# Patient Record
Sex: Male | Born: 1960 | Race: White | Hispanic: No | Marital: Married | Smoking: Former smoker
Health system: Southern US, Community
[De-identification: ages and names within clinical notes are randomized; demographics above are authoritative.]

## PROBLEM LIST (undated history)

## (undated) DIAGNOSIS — R945 Abnormal results of liver function studies: Secondary | ICD-10-CM

## (undated) DIAGNOSIS — R7989 Other specified abnormal findings of blood chemistry: Secondary | ICD-10-CM

## (undated) DIAGNOSIS — I428 Other cardiomyopathies: Secondary | ICD-10-CM

## (undated) DIAGNOSIS — I4729 Other ventricular tachycardia: Secondary | ICD-10-CM

## (undated) DIAGNOSIS — I472 Ventricular tachycardia: Secondary | ICD-10-CM

## (undated) DIAGNOSIS — I5022 Chronic systolic (congestive) heart failure: Secondary | ICD-10-CM

## (undated) DIAGNOSIS — I498 Other specified cardiac arrhythmias: Secondary | ICD-10-CM

## (undated) HISTORY — DX: Other ventricular tachycardia: I47.29

## (undated) HISTORY — DX: Ventricular tachycardia: I47.2

## (undated) HISTORY — DX: Abnormal results of liver function studies: R94.5

## (undated) HISTORY — DX: Other specified cardiac arrhythmias: I49.8

## (undated) HISTORY — DX: Chronic systolic (congestive) heart failure: I50.22

## (undated) HISTORY — DX: Other cardiomyopathies: I42.8

## (undated) HISTORY — PX: CARDIAC CATHETERIZATION: SHX172

## (undated) HISTORY — DX: Other specified abnormal findings of blood chemistry: R79.89

---

## 2010-12-09 ENCOUNTER — Inpatient Hospital Stay (HOSPITAL_COMMUNITY)
Admission: EM | Admit: 2010-12-09 | Discharge: 2010-12-15 | DRG: 287 | Disposition: A | Discharge status: Home / Self Care | Payer: Managed Care, Other (non HMO) | Attending: Cardiology | Admitting: Cardiology

## 2010-12-09 ENCOUNTER — Emergency Department (HOSPITAL_COMMUNITY): Payer: Managed Care, Other (non HMO)

## 2010-12-09 DIAGNOSIS — I5043 Acute on chronic combined systolic (congestive) and diastolic (congestive) heart failure: Principal | ICD-10-CM | POA: Diagnosis present

## 2010-12-09 DIAGNOSIS — I4729 Other ventricular tachycardia: Secondary | ICD-10-CM | POA: Diagnosis present

## 2010-12-09 DIAGNOSIS — J4489 Other specified chronic obstructive pulmonary disease: Secondary | ICD-10-CM | POA: Diagnosis present

## 2010-12-09 DIAGNOSIS — I428 Other cardiomyopathies: Secondary | ICD-10-CM | POA: Diagnosis present

## 2010-12-09 DIAGNOSIS — R079 Chest pain, unspecified: Secondary | ICD-10-CM

## 2010-12-09 DIAGNOSIS — J449 Chronic obstructive pulmonary disease, unspecified: Secondary | ICD-10-CM | POA: Diagnosis present

## 2010-12-09 DIAGNOSIS — I472 Ventricular tachycardia, unspecified: Secondary | ICD-10-CM | POA: Diagnosis present

## 2010-12-09 DIAGNOSIS — I429 Cardiomyopathy, unspecified: Secondary | ICD-10-CM | POA: Insufficient documentation

## 2010-12-09 DIAGNOSIS — E871 Hypo-osmolality and hyponatremia: Secondary | ICD-10-CM | POA: Diagnosis not present

## 2010-12-09 LAB — MAGNESIUM: Magnesium: 2 mg/dL (ref 1.5–2.5)

## 2010-12-09 LAB — COMPREHENSIVE METABOLIC PANEL
AST: 80 U/L — ABNORMAL HIGH (ref 0–37)
Albumin: 3.7 g/dL (ref 3.5–5.2)
Alkaline Phosphatase: 82 U/L (ref 39–117)
BUN: 22 mg/dL (ref 6–23)
Chloride: 102 mEq/L (ref 96–112)
GFR calc Af Amer: >60 mL/min (ref >60)
Potassium: 4.5 mEq/L (ref 3.5–5.1)
Total Bilirubin: 0.6 mg/dL (ref 0.3–1.2)
Total Protein: 6.3 g/dL (ref 6.0–8.3)

## 2010-12-09 LAB — CBC
HCT: 47.3 % (ref 39.0–52.0)
Hemoglobin: 16.5 g/dL (ref 13.0–17.0)
MCH: 34.6 pg — ABNORMAL HIGH (ref 26.0–34.0)
MCHC: 34.9 g/dL (ref 30.0–36.0)
MCV: 99.2 fL (ref 78.0–100.0)
RDW: 13.4 % (ref 11.5–15.5)

## 2010-12-09 LAB — DIFFERENTIAL
Eosinophils Relative: 1 % (ref 0–5)
Lymphocytes Relative: 23 % (ref 12–46)
Lymphs Abs: 3.1 10*3/uL (ref 0.7–4.0)
Monocytes Absolute: 1.1 10*3/uL — ABNORMAL HIGH (ref 0.1–1.0)
Monocytes Relative: 8 % (ref 3–12)
Neutro Abs: 8.8 10*3/uL — ABNORMAL HIGH (ref 1.7–7.7)

## 2010-12-09 LAB — PRO B NATRIURETIC PEPTIDE: Pro B Natriuretic peptide (BNP): 12351 pg/mL — ABNORMAL HIGH (ref 0–125)

## 2010-12-09 LAB — URINALYSIS, ROUTINE W REFLEX MICROSCOPIC
Bilirubin Urine: NEGATIVE
Ketones, ur: NEGATIVE mg/dL
Nitrite: NEGATIVE
Specific Gravity, Urine: 1.02 (ref 1.005–1.030)
Urobilinogen, UA: 0.2 mg/dL (ref 0.0–1.0)
pH: 5.5 (ref 5.0–8.0)

## 2010-12-09 LAB — POCT CARDIAC MARKERS
CKMB, poc: 5.2 ng/mL (ref 1.0–8.0)
CKMB, poc: 2.7 ng/mL (ref 1.0–8.0)
Myoglobin, poc: 82.5 ng/mL (ref 12–200)
Troponin i, poc: <0.05 ng/mL (ref 0.00–0.09)

## 2010-12-09 MED ORDER — IOHEXOL 300 MG/ML  SOLN
93.0000 mL | Freq: Once | INTRAMUSCULAR | Status: AC | PRN
  Administered 2010-12-09: 93 mL via INTRAVENOUS

## 2010-12-10 DIAGNOSIS — I5023 Acute on chronic systolic (congestive) heart failure: Secondary | ICD-10-CM

## 2010-12-10 LAB — CBC
Hemoglobin: 14.8 g/dL (ref 13.0–17.0)
MCH: 33.9 pg (ref 26.0–34.0)
Platelets: 291 10*3/uL (ref 150–400)
RBC: 4.36 MIL/uL (ref 4.22–5.81)
WBC: 10.6 10*3/uL — ABNORMAL HIGH (ref 4.0–10.5)

## 2010-12-10 LAB — CARDIAC PANEL(CRET KIN+CKTOT+MB+TROPI)
CK, MB: 6 ng/mL — ABNORMAL HIGH (ref 0.3–4.0)
CK, MB: 7.8 ng/mL (ref 0.3–4.0)
Relative Index: 3.5 — ABNORMAL HIGH (ref 0.0–2.5)
Total CK: 152 U/L (ref 7–232)
Total CK: 179 U/L (ref 7–232)

## 2010-12-10 LAB — COMPREHENSIVE METABOLIC PANEL
AST: 190 U/L — ABNORMAL HIGH (ref 0–37)
Albumin: 3.1 g/dL — ABNORMAL LOW (ref 3.5–5.2)
Alkaline Phosphatase: 68 U/L (ref 39–117)
BUN: 21 mg/dL (ref 6–23)
GFR calc Af Amer: >60 mL/min (ref >60)
Potassium: 3.6 mEq/L (ref 3.5–5.1)
Sodium: 140 mEq/L (ref 135–145)
Total Protein: 5.4 g/dL — ABNORMAL LOW (ref 6.0–8.3)

## 2010-12-10 LAB — LIPID PANEL
Cholesterol: 118 mg/dL (ref 0–200)
Total CHOL/HDL Ratio: 2.4 RATIO
VLDL: 8 mg/dL (ref 0–40)

## 2010-12-10 LAB — DIFFERENTIAL
Basophils Absolute: 0.1 10*3/uL (ref 0.0–0.1)
Basophils Relative: 1 % (ref 0–1)
Eosinophils Absolute: 0.1 10*3/uL (ref 0.0–0.7)
Neutro Abs: 6.3 10*3/uL (ref 1.7–7.7)
Neutrophils Relative %: 60 % (ref 43–77)

## 2010-12-10 LAB — PRO B NATRIURETIC PEPTIDE: Pro B Natriuretic peptide (BNP): 12676 pg/mL — ABNORMAL HIGH (ref 0–125)

## 2010-12-10 LAB — HEMOGLOBIN A1C: Mean Plasma Glucose: 123 mg/dL — ABNORMAL HIGH (ref <117)

## 2010-12-10 LAB — HEPARIN LEVEL (UNFRACTIONATED)
Heparin Unfractionated: <0.1 IU/mL — ABNORMAL LOW (ref 0.30–0.70)
Heparin Unfractionated: 0.32 IU/mL (ref 0.30–0.70)

## 2010-12-11 ENCOUNTER — Inpatient Hospital Stay (HOSPITAL_COMMUNITY): Payer: Managed Care, Other (non HMO)

## 2010-12-11 DIAGNOSIS — I369 Nonrheumatic tricuspid valve disorder, unspecified: Secondary | ICD-10-CM

## 2010-12-11 LAB — DIFFERENTIAL
Basophils Relative: 1 % (ref 0–1)
Eosinophils Absolute: 0.2 10*3/uL (ref 0.0–0.7)
Eosinophils Relative: 2 % (ref 0–5)
Lymphs Abs: 3.3 10*3/uL (ref 0.7–4.0)
Monocytes Relative: 9 % (ref 3–12)

## 2010-12-11 LAB — BASIC METABOLIC PANEL
Chloride: 100 mEq/L (ref 96–112)
Creatinine, Ser: 1.02 mg/dL (ref 0.4–1.5)
GFR calc Af Amer: >60 mL/min (ref >60)
Potassium: 3.7 mEq/L (ref 3.5–5.1)
Sodium: 138 mEq/L (ref 135–145)

## 2010-12-11 LAB — CARBOXYHEMOGLOBIN
Carboxyhemoglobin: 1.1 % (ref 0.5–1.5)
Methemoglobin: 0.3 % (ref 0.0–1.5)
O2 Saturation: 67.2 %
Total hemoglobin: 15.6 g/dL (ref 13.5–18.0)

## 2010-12-11 LAB — COMPREHENSIVE METABOLIC PANEL
Albumin: 3 g/dL — ABNORMAL LOW (ref 3.5–5.2)
BUN: 25 mg/dL — ABNORMAL HIGH (ref 6–23)
Calcium: 8.8 mg/dL (ref 8.4–10.5)
Creatinine, Ser: 1.02 mg/dL (ref 0.4–1.5)
Glucose, Bld: 96 mg/dL (ref 70–99)
Total Protein: 5.3 g/dL — ABNORMAL LOW (ref 6.0–8.3)

## 2010-12-11 LAB — CBC
MCH: 33.8 pg (ref 26.0–34.0)
MCV: 97.3 fL (ref 78.0–100.0)
Platelets: 294 10*3/uL (ref 150–400)
RDW: 13.2 % (ref 11.5–15.5)

## 2010-12-11 LAB — HEPARIN LEVEL (UNFRACTIONATED): Heparin Unfractionated: 0.29 IU/mL — ABNORMAL LOW (ref 0.30–0.70)

## 2010-12-11 LAB — MAGNESIUM: Magnesium: 1.9 mg/dL (ref 1.5–2.5)

## 2010-12-12 DIAGNOSIS — I428 Other cardiomyopathies: Secondary | ICD-10-CM

## 2010-12-12 LAB — BASIC METABOLIC PANEL
BUN: 19 mg/dL (ref 6–23)
CO2: 31 mEq/L (ref 19–32)
Chloride: 97 mEq/L (ref 96–112)
Creatinine, Ser: 0.96 mg/dL (ref 0.4–1.5)

## 2010-12-12 LAB — CARBOXYHEMOGLOBIN: Total hemoglobin: 15.2 g/dL (ref 13.5–18.0)

## 2010-12-12 LAB — DIFFERENTIAL
Eosinophils Absolute: 0.3 10*3/uL (ref 0.0–0.7)
Lymphs Abs: 3.1 10*3/uL (ref 0.7–4.0)
Monocytes Absolute: 0.8 10*3/uL (ref 0.1–1.0)
Monocytes Relative: 8 % (ref 3–12)
Neutrophils Relative %: 56 % (ref 43–77)

## 2010-12-12 LAB — CBC
Hemoglobin: 14.9 g/dL (ref 13.0–17.0)
MCH: 33.7 pg (ref 26.0–34.0)
MCV: 98.9 fL (ref 78.0–100.0)
Platelets: 285 10*3/uL (ref 150–400)
RBC: 4.42 MIL/uL (ref 4.22–5.81)

## 2010-12-12 LAB — POCT I-STAT 3, VENOUS BLOOD GAS (G3P V)
O2 Saturation: 68 %
TCO2: 29 mmol/L (ref 0–100)

## 2010-12-12 LAB — POCT I-STAT 3, ART BLOOD GAS (G3+)
Acid-Base Excess: 1 mmol/L (ref 0.0–2.0)
Bicarbonate: 25.6 mEq/L — ABNORMAL HIGH (ref 20.0–24.0)
O2 Saturation: 94 %
pO2, Arterial: 69 mmHg — ABNORMAL LOW (ref 80.0–100.0)

## 2010-12-13 ENCOUNTER — Encounter: Payer: Self-pay | Admitting: *Deleted

## 2010-12-13 ENCOUNTER — Telehealth: Payer: Self-pay | Admitting: *Deleted

## 2010-12-13 LAB — DIFFERENTIAL
Basophils Absolute: 0 10*3/uL (ref 0.0–0.1)
Eosinophils Absolute: 0.2 10*3/uL (ref 0.0–0.7)
Eosinophils Relative: 3 % (ref 0–5)

## 2010-12-13 LAB — CBC
MCV: 98.9 fL (ref 78.0–100.0)
Platelets: 275 10*3/uL (ref 150–400)
RDW: 13.4 % (ref 11.5–15.5)
WBC: 8.4 10*3/uL (ref 4.0–10.5)

## 2010-12-13 LAB — BASIC METABOLIC PANEL
BUN: 22 mg/dL (ref 6–23)
Calcium: 9.4 mg/dL (ref 8.4–10.5)
GFR calc non Af Amer: >60 mL/min (ref >60)
Potassium: 4.3 mEq/L (ref 3.5–5.1)
Sodium: 136 mEq/L (ref 135–145)

## 2010-12-13 NOTE — Telephone Encounter (Signed)
INPATIENT ADMISSION 12/09/10 BY [facility] DUE TO ABNORMAL EKG, SOB AND CARDIOMYOPATHY.  INPATIENT ADMISSION WAS AUTHORIZED, BUT CATH ON 12/12/10 WAS NOT.  CATH WAS NOT ON CATH LIST OR ON ADD ON LIST.  DOCUMENTATION IS AVAILABLE FOR THIS LIST.  CANNOT RETRO AUTH FOR CATH.

## 2010-12-14 LAB — BASIC METABOLIC PANEL
Calcium: 9.2 mg/dL (ref 8.4–10.5)
GFR calc Af Amer: >60 mL/min (ref >60)
GFR calc non Af Amer: >60 mL/min (ref >60)
Potassium: 4.2 mEq/L (ref 3.5–5.1)
Sodium: 136 mEq/L (ref 135–145)

## 2010-12-14 LAB — CARBOXYHEMOGLOBIN
Methemoglobin: 0.8 % (ref 0.0–1.5)
Total hemoglobin: 15.5 g/dL (ref 13.5–18.0)

## 2010-12-14 LAB — DIFFERENTIAL
Eosinophils Absolute: 0.3 10*3/uL (ref 0.0–0.7)
Eosinophils Relative: 4 % (ref 0–5)
Lymphs Abs: 2.6 10*3/uL (ref 0.7–4.0)
Monocytes Absolute: 0.7 10*3/uL (ref 0.1–1.0)

## 2010-12-14 LAB — CBC
HCT: 45 % (ref 39.0–52.0)
MCHC: 34 g/dL (ref 30.0–36.0)
Platelets: 266 10*3/uL (ref 150–400)
RDW: 13.4 % (ref 11.5–15.5)
WBC: 7.7 10*3/uL (ref 4.0–10.5)

## 2010-12-14 LAB — HIV ANTIBODY (ROUTINE TESTING W REFLEX): HIV: NONREACTIVE

## 2010-12-15 DIAGNOSIS — I5021 Acute systolic (congestive) heart failure: Secondary | ICD-10-CM

## 2010-12-15 LAB — BASIC METABOLIC PANEL
BUN: 20 mg/dL (ref 6–23)
Chloride: 99 mEq/L (ref 96–112)
Creatinine, Ser: 1.03 mg/dL (ref 0.4–1.5)
GFR calc non Af Amer: >60 mL/min (ref >60)
Glucose, Bld: 98 mg/dL (ref 70–99)
Potassium: 4.6 mEq/L (ref 3.5–5.1)

## 2010-12-15 LAB — DIFFERENTIAL
Eosinophils Absolute: 0.3 10*3/uL (ref 0.0–0.7)
Eosinophils Relative: 4 % (ref 0–5)
Lymphocytes Relative: 29 % (ref 12–46)
Lymphs Abs: 2.5 10*3/uL (ref 0.7–4.0)
Monocytes Relative: 8 % (ref 3–12)

## 2010-12-15 LAB — CARBOXYHEMOGLOBIN
Methemoglobin: 0.8 % (ref 0.0–1.5)
Total hemoglobin: 15.5 g/dL (ref 13.5–18.0)

## 2010-12-15 LAB — CBC
HCT: 45.2 % (ref 39.0–52.0)
MCH: 33.4 pg (ref 26.0–34.0)
MCV: 98.7 fL (ref 78.0–100.0)
Platelets: 254 10*3/uL (ref 150–400)
RDW: 13.2 % (ref 11.5–15.5)
WBC: 8.5 10*3/uL (ref 4.0–10.5)

## 2010-12-15 LAB — TSH: TSH: 6.507 u[IU]/mL — ABNORMAL HIGH (ref 0.350–4.500)

## 2010-12-15 LAB — FERRITIN: Ferritin: 65 ng/mL (ref 22–322)

## 2010-12-15 NOTE — Cardiovascular Report (Signed)
  George Barber, George Barber                ACCOUNT NO.:  [id number]  MEDICAL RECORD NO.:  [id number]           PATIENT TYPE:  I  LOCATION:  2918                         FACILITY:  [facility]  PHYSICIAN:  Peter M. Swaziland, M.D.  DATE OF BIRTH:  1961-02-28  DATE OF PROCEDURE:  12/12/2010 DATE OF DISCHARGE:                           CARDIAC CATHETERIZATION   INDICATIONS FOR PROCEDURE:   50 year old white male who presents with new onset of severe cardiomyopathy.  PROCEDURE:  Right and left heart catheterization, coronary and left ventricular angiography.  ACCESS:  Via the right femoral artery and vein using standard Seldinger technique.  EQUIPMENT:  5-French 4-cm right and left Judkins catheter, 5-French pigtail catheter, 5-French arterial sheath, 8-French venous sheath and a VIP Swan-Ganz catheter.  MEDICATIONS:  Local anesthesia 1% Xylocaine, Versed 2 mg IV, fentanyl 25 mcg IV.  CONTRAST:  70 mL of Omnipaque.  HEMODYNAMIC DATA:  Right atrial pressure is 9/6 with a mean of 6 mmHg. Right ventricular pressure is 35 with an EDP of 4 mmHg.  Pulmonary artery pressures 31/19 with a mean of 25 mmHg.  Pulmonary capillary wedge pressure is 19/19 with a mean of 15 mmHg.  Aortic pressure is 88/69 with a mean of 78 mmHg.  The left ventricular pressure is 89 with an EDP of 14 mmHg.  Cardiac output by Fick is 3.51 L/min with an index of 1.93 L/min/m2.  ANGIOGRAPHIC DATA:  The left ventricle is severely enlarged with severe global hypokinesis and overall ejection fraction of 10-15%.  The left main coronary artery is normal.  The left anterior descending artery is normal.  The left circumflex coronary is a large dominant vessel and is normal.  The right coronary artery is a small nondominant vessel and is normal.  FINAL INTERPRETATION: 1. Normal coronary anatomy. 2. Severe left ventricular dysfunction. 3. Normal right heart pressures.          ______________________________ Peter M.  Swaziland, M.D.     PMJ/MEDQ  D:  12/12/2010  T:  12/13/2010  Job:  [id number]  cc:   Rollene Rotunda, MD, [facility]  Electronically Signed by PETER Swaziland M.D. on 12/15/2010 11:52:31 AM

## 2010-12-16 LAB — HEPATITIS PANEL, ACUTE
HCV Ab: NEGATIVE
Hep A IgM: NEGATIVE

## 2010-12-21 NOTE — Discharge Summary (Signed)
NAMEAVERY, KLINGBEIL                ACCOUNT NO.:  [id number]  MEDICAL RECORD NO.:  [id number]           PATIENT TYPE:  I  LOCATION:  2918                         FACILITY:  [facility]  PHYSICIAN:  Bevelyn Buckles. Bensimhon, MDDATE OF BIRTH:  August 27, 1960  DATE OF ADMISSION:  12/09/2010 DATE OF DISCHARGE:  12/15/2010                              DISCHARGE SUMMARY   PROCEDURES: 1. Right heart catheterization. 2. Left heart catheterization. 3. Coronary arteriogram. 4. Left ventriculogram. 5. A 2-D echocardiogram. 6. CT angiogram of the chest. 7. Portable chest x-ray.  PRIMARY FINAL DISCHARGE DIAGNOSIS:  Acute systolic congestive heart failure.  SECONDARY DIAGNOSES: 1. Nonischemic cardiomyopathy, new diagnosis. 2. History of ethyl alcohol and tobacco use.  TIME AT DISCHARGE:  52 minutes.  HOSPITAL COURSE:  [name] is a 50 year old male with no previous cardiac issues.  He had been having problems with cough, shortness of breath, and described dyspnea on exertion as well as orthopnea.  He came to the hospital and his chest x-ray suggested cardiomegaly.  He was sent to [facility] for further evaluation.  He had an echocardiogram, which showed an EF of 15%.  He had acute systolic congestive heart failure and was diuresed.  He was having runs of nonsustained VT.  Once his condition stabilized, he was taken to the Cath Lab for right and left heart catheterization.  This was done on Dec 12, 2010.  He had no coronary artery disease.  He had severe left ventricular dysfunction with an EF of 10-15%.  By the time, he was stable for cath, he had normal right heart pressures.  He tolerated the procedure well.  Despite diuresis and improving respiratory status, he continued to have episodes of nonsustained VT.  His magnesium level was checked and was within normal limits.  He had had some initial elevation in his CK-MBs, but his troponins remained negative.  His initial pro-BNP was 12,676. He was  started on amiodarone for the nonsustained VT and the LifeVest was sent for.  A smoking cessation consult was called and the importance of EtOH cessation was reinforced.  He was started on an ACE inhibitor and a beta-blocker.  His blood pressure was on the low side of normal, but he tolerated the medications well.  His Lasix was changed to p.o. as his respiratory status improved. Aldactone was added to his medication regimen as well.  His potassium required supplementation and this was done.  His TSH is minimally elevated at 6.507 with this can be rechecked as an outpatient.  His ferritin is within normal limits at 65.  He developed mild hyponatremia with diuresis and his sodium at discharge is 134, but this can be followed as an outpatient.  On Dec 15, 2010, he was evaluated by Dr. Gala Romney.  The patient was having tachypalpitations at times and still having some episodes of nonsustained VT, but he was otherwise asymptomatic with these.  The LifeVest was in place and the patient was instructed on how to will apply it.  By Dec 15, 2010, his co-ox was 64.5%.  His O2 saturation was 97-100% on room air.  The patient's  weight had gone from 155 pounds on admission to 131.9 pounds.  Mr. Salois was evaluated by Dr. Gala Romney and considered stable for discharge, to follow up as an outpatient.  DISCHARGE INSTRUCTIONS: 1. His activity level is to be increased gradually. 2. He is to weigh himself daily. 3. He is to stick to a 2000 mg sodium heart-healthy diet. 4. He is to call our office for problems with the cath site. 5. He is not to work until cleared by MD. 6. He is to follow up with Tereso Newcomer, PA-C for Dr. Gala Romney on     December 23, 2010 at 10:30.  He is to get a BMET that day.  DISCHARGE MEDICATIONS: 1. Medrol Dosepak is discontinued. 2. Symbicort t.i.d. p.r.n. 3. Enalapril 10 mg b.i.d. 4. Amiodarone 200 mg two tablets b.i.d. 5. Spiriva b.i.d. p.r.n. 6. Coreg 6.25 mg b.i.d. 7. Lasix  40 mg a day. 8. Aldactone 25 mg a day. 9. Multivitamin daily. 10.Biaxin is discontinued. 11.Aspirin 81 mg a day. 12.Hydrochlorothiazide is discontinued.     Theodore Demark, PA-C   ______________________________ Bevelyn Buckles. Bensimhon, MD    RB/MEDQ  D:  12/15/2010  T:  12/15/2010  Job:  [id number]  Electronically Signed by Theodore Demark PA-C on 12/21/2010 11:27:59 AM Electronically Signed by Arvilla Meres MD on 12/21/2010 10:43:32 PM

## 2010-12-22 ENCOUNTER — Encounter: Payer: Self-pay | Admitting: Physician Assistant

## 2010-12-23 ENCOUNTER — Other Ambulatory Visit: Payer: Managed Care, Other (non HMO) | Admitting: *Deleted

## 2010-12-23 ENCOUNTER — Telehealth: Payer: Self-pay | Admitting: *Deleted

## 2010-12-23 ENCOUNTER — Encounter: Payer: Self-pay | Admitting: Physician Assistant

## 2010-12-23 ENCOUNTER — Ambulatory Visit (INDEPENDENT_AMBULATORY_CARE_PROVIDER_SITE_OTHER): Payer: Managed Care, Other (non HMO) | Admitting: Physician Assistant

## 2010-12-23 VITALS — BP 108/78 | HR 72 | Ht 69.0 in | Wt 137.0 lb

## 2010-12-23 DIAGNOSIS — I5022 Chronic systolic (congestive) heart failure: Secondary | ICD-10-CM | POA: Insufficient documentation

## 2010-12-23 DIAGNOSIS — E785 Hyperlipidemia, unspecified: Secondary | ICD-10-CM

## 2010-12-23 DIAGNOSIS — I472 Ventricular tachycardia: Secondary | ICD-10-CM

## 2010-12-23 DIAGNOSIS — R7989 Other specified abnormal findings of blood chemistry: Secondary | ICD-10-CM

## 2010-12-23 DIAGNOSIS — I4729 Other ventricular tachycardia: Secondary | ICD-10-CM | POA: Insufficient documentation

## 2010-12-23 DIAGNOSIS — I429 Cardiomyopathy, unspecified: Secondary | ICD-10-CM

## 2010-12-23 LAB — HEPATIC FUNCTION PANEL
AST: 30 U/L (ref 0–37)
Alkaline Phosphatase: 70 U/L (ref 39–117)
Bilirubin, Direct: 0.1 mg/dL (ref 0.0–0.3)

## 2010-12-23 LAB — BASIC METABOLIC PANEL
BUN: 21 mg/dL (ref 6–23)
CO2: 28 mEq/L (ref 19–32)
Calcium: 9.3 mg/dL (ref 8.4–10.5)
Chloride: 95 mEq/L — ABNORMAL LOW (ref 96–112)
Creatinine, Ser: 1.1 mg/dL (ref 0.4–1.5)

## 2010-12-23 MED ORDER — ENALAPRIL MALEATE 10 MG PO TABS: 10.0000 mg | ORAL_TABLET | Freq: Two times a day (BID) | ORAL | Status: DC

## 2010-12-23 MED ORDER — AMIODARONE HCL 200 MG PO TABS: ORAL_TABLET | ORAL | Status: DC

## 2010-12-23 MED ORDER — CARVEDILOL 12.5 MG PO TABS: 12.5000 mg | ORAL_TABLET | Freq: Two times a day (BID) | ORAL | Status: DC

## 2010-12-23 NOTE — Assessment & Plan Note (Signed)
Will need follow up echo in about 3 months after on maximum medical therapy.

## 2010-12-23 NOTE — H&P (Addendum)
NAMEJHONY, George Barber                ACCOUNT NO.:  [id number]  MEDICAL RECORD NO.:  [id number]           PATIENT TYPE:  I  LOCATION:  2918                         FACILITY:  [facility]  PHYSICIAN:  Rollene Rotunda, MD, FACCDATE OF BIRTH:  Mar 22, 1961  DATE OF ADMISSION:  12/09/2010 DATE OF DISCHARGE:                             HISTORY & PHYSICAL   PRIMARY CARDIOLOGIST:  New.  PRIMARY MEDICAL DOCTOR:  Does not have one.  CHIEF COMPLAINT:  Shortness of breath.  HISTORY OF PRESENT ILLNESS:  Mr. Yeo is a very pleasant 50 year old gentleman with no significant past medical history, although he does not regularly see a doctor.  He has no prior cardiac history as well.  He has had several months of shortness of breath, coughing, and a smothering sensation while he has been sleeping.  His sister-in-law noticed yesterday that his shortness of breath became so bad that he could not even push a buggy in the store without having to stop.  He has had no chest pain, whatsoever, including with exertion.  He has had lower extremity edema for several months as well.  He sought medical care from a walk-in type setting, was treated with various antibiotics, inhalers, and hydrochlorothiazide without significant relief.  He was seen today at [facility] and noted to have shortness of breath, tachycardia, frequent ectopy on EKG with the chest x-ray suggesting cardiomegaly and was subsequently sent over to [facility] for further evaluation.  He has demonstrated nonsustained VT here in the ER on the monitor with approximately 10 beats.  He does endorse a history of fluttering sensations in his chest going back many years, lasting 10-15 seconds at a time on rare occasions, associated with a shortness of breath sensation.  He has not had any episodes of syncope.  He was started on a lidocaine drip here in the ER.  He is also written to receive 80 mg of IV Lasix.  Currently, he  denies any chest pain, and just mild shortness of breath.  He feels anxious.  His EKG demonstrates sinus tach with PVCs and couplets and occasional PVCs with Q-waves in V1 through V3.  He denies any acute event which he can say was different than any other progressively worse than he has had since onset of the shortness of breath.  PAST MEDICAL HISTORY:  None.  Does not see any doctor regularly.  He has had heart fluttering as above in the past, but no formal heart workup.  MEDICATIONS:  The patient is not sure of his medications.  He notes he has been treated with antibiotics, Medrol, and hydrochlorothiazide without relief.  ALLERGIES:  No known drug allergies.  SOCIAL HISTORY:  Mr. Stan is married and lives with his wife.  They have 1 child together.  He is an Personnel officer.  He smoked for 20 years and quit 1 month ago.  He quit alcohol 1 month ago, after a 20-year history of what he describes as social alcohol, but does endorse drinking 6-8 beers per day.  FAMILY HISTORY:  Mother died at age 17 of cancer.  Father is living  with no known CAD, but does have emphysema.  His siblings are in good health with heart problems.  REVIEW OF SYSTEMS:  No fevers, chills, chest pain, nausea, vomiting, diarrhea, bright red blood per rectum, melena, or hematemesis.  No hematuria.  All other systems are reviewed and otherwise negative. Please see HPI for pertinent positives.  LABORATORY DATA:  WBC 13.1, hemoglobin 16.5, hematocrit 47.3, platelet count 331.  Sodium 139, potassium 4.5, chloride 102, CO2 of 24, glucose 124, BUN 22, creatinine 1.27.  Total bilirubin 0.6, alk phos 82, AST 80, ALT 114, albumin 3.7, BNP 12,351.  Cardiac enzymes negative x1.  RADIOLOGY: 1. Chest x-ray showed no acute abnormality, indeterminate nodule of     the right upper lobe. 2. CT angio, formal report not yet populating in e-chart and unable to     access M-Stat.  Negative for PE.  Please refer to report when  it     becomes available.  PHYSICAL EXAMINATION:  VITAL SIGNS:  Temperature 98.7, pulse 123, respirations 18, blood pressure 125/97, pulse ox 100% on 2 L. GENERAL:  This is a pleasant white male in no acute distress, thin. HEENT:  Normocephalic, atraumatic with extraocular movements intact. Clear sclerae.  Nares are without discharge. NECK:  Supple without carotid bruit. HEART:  Auscultation of the heart reveals tachycardic rate, irregular rhythm with frequent ectopy correlating on the monitor without murmurs, rubs, or gallops. LUNGS:  Lung sounds have bilateral rales without wheezes or rhonchi about half the way up, good air movement. ABDOMEN:  Soft, nontender, nondistended with positive bowel sounds. EXTREMITIES:  Warm, dry, and with trace sock line edema, 2+ pedal pulses bilaterally. NEUROLOGIC:  He is alert and oriented x3, responds to questions appropriately with a normal affect.  He is somewhat anxious appearing.  ASSESSMENT AND PLAN:  The patient was seen and examined by Dr. Antoine Poche and myself.  This is a 50 year old gentleman without prior cardiac history or known medical history, who has not been followed by any primary care physician.  He presented with progressive dyspnea on exertion in the emergency room.  He is known to be tachycardic with frequent ectopy as well as nonsustained ventricular tachycardia. Bedside echocardiogram does demonstrate global hypokinesis with an ejection fraction of 15% in this presentation and is felt consistent with systolic congestive heart failure.  At this time, the etiology of his cardiomyopathy is unknown, but he does have a significant history of alcohol abuse, having quit 1 month ago.  However, given his ectopy and nonsustained VT, he will require a cardiac catheterization when able to lie flat to rule out ischemia as a contributing cause.  We will check a full formal echo.  An ACE inhibitor will be started in the form of enalapril 2.5  mg p.o. b.i.d.  We will hold off on beta-blocker given his acute state, but this may be started tomorrow if his pressure holds.  He may also be a candidate for spironolactone therapy with eventual consideration for ICD if his EF should not improve with medical therapy. Per Dr. Antoine Poche, we will discontinue his IV lidocaine, and if his VT should recur, we would consider IV amiodarone.  We will also check lab work to assess his other cardiac risk factors, including hemoglobin A1c with an abnormal glucose as well as a fasting lipid panel.  His liver function tests are somewhat elevated, question of this is related to possible alcohol use versus hepatic congestion versus an ischemic event.  Cardiac enzymes will be checked as  well.  His leukocytosis is felt secondary to his presentation in the setting of recent Medrol Dosepak use.  We will continue to follow these.  Plan was discussed with the patient's family who were in agreement.     Ronie Spies, P.A.C.   ______________________________ Rollene Rotunda, MD, [facility]    DD/MEDQ  D:  12/09/2010  T:  12/10/2010  Job:  [id number]  Electronically Signed by Ronie Spies  on 12/23/2010 01:42:13 PM Electronically Signed by Rollene Rotunda MD [facility] on 01/11/2011 11:44:01 AM

## 2010-12-23 NOTE — Progress Notes (Signed)
History of Present Illness: Primary Cardiologist:  Dr. Arvilla Meres  George Barber is a 50 y.o. male Who presented to [facility] 5/18 with evidence of acute systolic heart failure.  He was diuresed and taken to the cardiac catheterization lab where he was noted to have normal coronary arteries.  Echocardiogram demonstrated an EF of 15%.  Heart failure medications were initiated and titrated.  He was noted to have several bouts of nonsustained ventricular tachycardia.  He was placed on amiodarone and was also placed on life fast.  He returns today for followup.  Labs: Na 134, K 4.6, creat 1.03, Hgb 15.3, TC 118, TG 40, HDL 49, LDL 61, AST 87, ALT 164, ALP 66, TSH 2.883, BNP 12,676, A1C 5.9, CE's neg x 3, Chest CT: no PE Hepatitis serologies: negative; HIV non reactive.  He is doing well.  No chest pain.  He feels tired.  Denies significant DOE.  Probable NYHA class 2-2b.  No orthopnea, PND, edema.  No syncope.  No alerts or therapies from his LifeVest.  No pain over cath site.  Watching salt.  Using salt substitute.  No more alcohol.  No weight gain since he went home.  Taking all meds.  Notes some lip dryness.  Past Medical History  Diagnosis Date  . NICM (nonischemic cardiomyopathy)     cath 12/13/10: Normal cors, EF 10-15%;  b. echo 5/12 EF 15%, mild MR, mod LAE, mild RVE, mild to mod RAE, mild to mod TR, PASP 44  . Systolic CHF, chronic   . NSVT (nonsustained ventricular tachycardia)     Life Vest; amiodarone rx  . Emphysema     Current Outpatient Prescriptions  Medication Sig Dispense Refill  . amiodarone (PACERONE) 200 MG tablet Take 400 mg by mouth 2 (two) times daily.       [name] Kitchen aspirin 81 MG tablet Take 81 mg by mouth daily.        . carvedilol (COREG) 12.5 MG tablet Take 1 tablet (12.5 mg total) by mouth 2 (two) times daily with a meal.  60 tablet  11  . enalapril (VASOTEC) 10 MG tablet Take 1 tablet (10 mg total) by mouth 2 (two) times daily.  60 tablet  11  . furosemide  (LASIX) 20 MG tablet Take 20 mg by mouth daily.        [name] Kitchen spironolactone (ALDACTONE) 25 MG tablet Take 25 mg by mouth daily.        [name] Kitchen DISCONTD: carvedilol (COREG) 6.25 MG tablet Take 6.25 mg by mouth 2 (two) times daily with a meal.        . DISCONTD: enalapril (VASOTEC) 10 MG tablet Take 10 mg by mouth 3 (three) times daily.       [name] Kitchen DISCONTD: furosemide (LASIX) 40 MG tablet Take 40 mg by mouth daily.        [name] Kitchen DISCONTD: Multiple Vitamin (MULTIVITAMIN) capsule Take 1 capsule by mouth daily.        [name] Kitchen DISCONTD: tiotropium (SPIRIVA) 18 MCG inhalation capsule Place 18 mcg into inhaler and inhale daily.          Allergies: No Known Allergies  Vital Signs: BP 108/78  Pulse 72  Ht 5\' 9"  (1.753 m)  Wt 137 lb (62.143 kg)  BMI 20.23 kg/m2  PHYSICAL EXAM: Well nourished, well developed, in no acute distress HEENT: normal Neck: no JVD Cardiac:  normal S1, S2; RRR; no murmur, no gallops Lungs:  clear to auscultation bilaterally, no wheezing, rhonchi or  rales Abd: soft, nontender, no hepatomegaly Ext: no edema; RFA site without hematoma or bruit Skin: warm and dry Neuro:  CNs 2-12 intact, no focal abnormalities noted  EKG:  Sinus rhythm, heart rate 72, normal axis, poor R-wave progression with Q waves in leads V1-V2, nonspecific ST-T wave changes  ASSESSMENT AND PLAN:

## 2010-12-23 NOTE — Assessment & Plan Note (Addendum)
Doing well.  Volume is stable.  Not sure why he is on enalapril TID.  Reviewed his chart and discharge notes indicate he was to take it BID.  Change enalapril to 10 mg BID.  Increase coreg to 12.5 mg BID.  Otherwise, he is on a good medical regimen.  We discussed salt restriction and refraining from alcohol.  Check BMET today.  Follow up with me in 2 weeks.  He lives close to [place] and would like to follow up there.  Will set him up with Dr. Antoine Poche in 4 weeks.

## 2010-12-23 NOTE — Patient Instructions (Addendum)
Change Enalapril (Vasotec) to 10 mg take one tablet twice daily. Increase Carvedilol (Coreg) to 12.5 mg twice daily (you can take 2 of the 6.25 mg tabs to equal 12.5 mg). RX sent to [facility] in [place] In one week, decrease Amiodarone to 200 mg one tablet twice daily.    Your physician recommends that you return for lab work in: TODAY BMET 428.22,  LFT'S 272.4     Your physician recommends that you schedule a follow-up appointment in: DR. Antoine Poche 01/18/11 @ 2 PM  Your physician recommends that you schedule a follow-up appointment in: [facility], PA-C 01/06/11 9:30 AM

## 2010-12-23 NOTE — Assessment & Plan Note (Signed)
Decrease Amiodarone to 200 mg BID in one week.  Continue LifeVest.  If EF does not recover, he will need EP referral for AICD.  Check LFTs today to follow up on elevated LFTs.  This may have been from hepatic congestion.  Will need TSH again in about 6-8 weeks.

## 2010-12-23 NOTE — Telephone Encounter (Signed)
LMOM to call back for instructions and to schedule a lab appointment.

## 2010-12-24 ENCOUNTER — Telehealth: Payer: Self-pay | Admitting: *Deleted

## 2010-12-24 DIAGNOSIS — I5022 Chronic systolic (congestive) heart failure: Secondary | ICD-10-CM

## 2010-12-24 NOTE — Telephone Encounter (Signed)
See phone note

## 2010-12-28 ENCOUNTER — Telehealth: Payer: Self-pay | Admitting: Cardiology

## 2010-12-28 ENCOUNTER — Other Ambulatory Visit (INDEPENDENT_AMBULATORY_CARE_PROVIDER_SITE_OTHER): Payer: Managed Care, Other (non HMO) | Admitting: *Deleted

## 2010-12-28 DIAGNOSIS — I5022 Chronic systolic (congestive) heart failure: Secondary | ICD-10-CM

## 2010-12-28 LAB — BASIC METABOLIC PANEL
BUN: 24 mg/dL — ABNORMAL HIGH (ref 6–23)
Calcium: 9.1 mg/dL (ref 8.4–10.5)
Creatinine, Ser: 1.2 mg/dL (ref 0.4–1.5)
GFR: 70.29 mL/min (ref >60.00)
Glucose, Bld: 69 mg/dL — ABNORMAL LOW (ref 70–99)

## 2010-12-28 NOTE — Telephone Encounter (Signed)
Pt returned your call.  

## 2011-01-06 ENCOUNTER — Encounter: Payer: Self-pay | Admitting: Physician Assistant

## 2011-01-06 ENCOUNTER — Ambulatory Visit (INDEPENDENT_AMBULATORY_CARE_PROVIDER_SITE_OTHER): Payer: Managed Care, Other (non HMO) | Admitting: Physician Assistant

## 2011-01-06 VITALS — BP 102/64 | HR 57 | Ht 69.0 in | Wt 144.0 lb

## 2011-01-06 DIAGNOSIS — I429 Cardiomyopathy, unspecified: Secondary | ICD-10-CM

## 2011-01-06 DIAGNOSIS — I472 Ventricular tachycardia, unspecified: Secondary | ICD-10-CM

## 2011-01-06 DIAGNOSIS — I4729 Other ventricular tachycardia: Secondary | ICD-10-CM

## 2011-01-06 DIAGNOSIS — I5022 Chronic systolic (congestive) heart failure: Secondary | ICD-10-CM

## 2011-01-06 MED ORDER — SPIRONOLACTONE 25 MG PO TABS: ORAL_TABLET | ORAL | Status: DC

## 2011-01-06 NOTE — Patient Instructions (Addendum)
Please check your medications at home with the list and call us if there is anything that does not match or make sense.  Start back on Spironolactone (Aldactone).  Take 25 mg 1/2 tablet daily for 3 days, then increase to 1 whole tablet daily.    Your physician recommends that you return for lab work in: please have lab work drawn in 7-10 days (BMP)

## 2011-01-06 NOTE — Progress Notes (Signed)
History of Present Illness: Primary Cardiologist:  Dr. Arvilla Meres  George Barber is a 50 y.o. male with NICM and chronic systolic heart failure.  Cardiac catheterization 5/12 demonstrated normal coronary arteries.  Echocardiogram 5/12 demonstrated an EF of 15%.  Due to nonsustained ventricular tachycardia, he was placed on amiodarone and a Life Vest.  I saw him 2 weeks ago for initial post hospital follow up.  His ACE was filled at [facility].  I changed this to b.i.d. and increased his carvedilol.  His potassium was too high and I stopped his spironolactone.  Follow up basic metabolic panel was normal.  He returns today for follow up.  He is doing well.  He has an occasional skipped beat.  He denies chest pain.  He denies shortness of breath.  He is NYHA class II.  He denies a tiny, PND or edema.  He denies syncope.  He denies any therapies from his life vest.  He is compliant with his diet.  Past Medical History  Diagnosis Date  . NICM (nonischemic cardiomyopathy)     cath 12/13/10: Normal cors, EF 10-15%;  b. echo 5/12 EF 15%, mild MR, mod LAE, mild RVE, mild to mod RAE, mild to mod TR, PASP 44  . Systolic CHF, chronic   . NSVT (nonsustained ventricular tachycardia)     Life Vest; amiodarone rx  . Emphysema     Current Outpatient Prescriptions  Medication Sig Dispense Refill  . amiodarone (PACERONE) 200 MG tablet Take 200 mg by mouth 2 (two) times daily.        [name] Kitchen aspirin 81 MG tablet Take 81 mg by mouth daily.        . carvedilol (COREG) 12.5 MG tablet Take 1 tablet (12.5 mg total) by mouth 2 (two) times daily with a meal.  60 tablet  11  . enalapril (VASOTEC) 10 MG tablet Take 1 tablet (10 mg total) by mouth 2 (two) times daily.      . furosemide (LASIX) 20 MG tablet Take 20 mg by mouth daily.        [name] Kitchen spironolactone (ALDACTONE) 25 MG tablet Take 25 mg by mouth daily.        [name] Kitchen DISCONTD: amiodarone (PACERONE) 200 MG tablet TAKE 2 TABS TWICE DAILY X 1 WEEK; THEN DECREASE TO 200 MG 1 TAB  TWICE DAILY  90 tablet  3  . DISCONTD: enalapril (VASOTEC) 10 MG tablet Take 1 tablet (10 mg total) by mouth 2 (two) times daily.  60 tablet  11    Allergies: No Known Allergies  Vital Signs: BP 102/64  Pulse 57  Ht 5\' 9"  (1.753 m)  Wt 144 lb (65.318 kg)  BMI 21.27 kg/m2  PHYSICAL EXAM: Well nourished, well developed, in no acute distress HEENT: normal Neck: no JVD Cardiac:  normal S1, S2; RRR; no murmur, no gallops Lungs:  clear to auscultation bilaterally, no wheezing, rhonchi or rales Abd: soft, nontender, no hepatomegaly Ext: no edema Skin: warm and dry Neuro:  CNs 2-12 intact, no focal abnormalities noted  EKG:  Sinus rhythm, heart rate 57, normal axis, poor R-wave progression with Q waves in leads V1-V2, nonspecific ST-T wave changes  ASSESSMENT AND PLAN:

## 2011-01-06 NOTE — Assessment & Plan Note (Signed)
Volume stable.  Tolerating current therapy.  Follow up potassium was normal.  Reinitiate spironolactone at 12.5 mg daily for 3 days then 25 mg a day.  Repeat basic metabolic panel in 7-10 days.  He has follow up with Dr. Antoine Poche in [place].  He will keep that appointment.

## 2011-01-06 NOTE — Assessment & Plan Note (Addendum)
Continue wearing life vest.  If ejection fraction recovers, this can be discontinued.  If it does not, he will need to see EP.

## 2011-01-06 NOTE — Assessment & Plan Note (Signed)
Reinitiate spironolactone.  He is on a good medical regimen.  His blood pressure and heart rate, at this point, do not allow for further titration of his medications.  Plan on repeat echocardiogram in the next 2-3 months.

## 2011-01-09 NOTE — Progress Notes (Signed)
Addended by: Tarri Fuller on: 01/09/2011 11:56 AM   Modules accepted: Orders

## 2011-01-18 ENCOUNTER — Encounter: Payer: Self-pay | Admitting: Cardiology

## 2011-01-18 ENCOUNTER — Ambulatory Visit (INDEPENDENT_AMBULATORY_CARE_PROVIDER_SITE_OTHER): Payer: Managed Care, Other (non HMO) | Admitting: Cardiology

## 2011-01-18 DIAGNOSIS — I429 Cardiomyopathy, unspecified: Secondary | ICD-10-CM

## 2011-01-18 DIAGNOSIS — I5022 Chronic systolic (congestive) heart failure: Secondary | ICD-10-CM

## 2011-01-18 DIAGNOSIS — F1011 Alcohol abuse, in remission: Secondary | ICD-10-CM

## 2011-01-18 DIAGNOSIS — I4729 Other ventricular tachycardia: Secondary | ICD-10-CM

## 2011-01-18 DIAGNOSIS — I472 Ventricular tachycardia, unspecified: Secondary | ICD-10-CM

## 2011-01-18 MED ORDER — LOSARTAN POTASSIUM 50 MG PO TABS: 50.0000 mg | ORAL_TABLET | Freq: Every day | ORAL | Status: DC

## 2011-01-18 NOTE — Progress Notes (Signed)
HPI The patient presents for followup of his nonischemic cardiomyopathy. He was last seen by Mr. Alben Spittle. He had his beta blocker titrated. He is wearing a life vest and feels occasional palpitations but has had no sustained dysrhythmias. He denies any chest pressure, neck or arm discomfort. He said of presyncope or syncope. He is breathing much better than he was in denies any PND or orthopnea. He does have a dry nonproductive cough.  He is not drinking EtOH or smoking. No Known Allergies  Current Outpatient Prescriptions  Medication Sig Dispense Refill  . amiodarone (PACERONE) 200 MG tablet Take 200 mg by mouth 2 (two) times daily.        [name] Kitchen aspirin 81 MG tablet Take 81 mg by mouth daily.        . carvedilol (COREG) 12.5 MG tablet Take 1 tablet (12.5 mg total) by mouth 2 (two) times daily with a meal.  60 tablet  11  . enalapril (VASOTEC) 10 MG tablet Take 1 tablet (10 mg total) by mouth 2 (two) times daily.      . furosemide (LASIX) 20 MG tablet Take 20 mg by mouth daily.        [name] Kitchen spironolactone (ALDACTONE) 25 MG tablet Take 1/2 tab for 3 days the 1 tab after  30 tablet  6    Past Medical History  Diagnosis Date  . NICM (nonischemic cardiomyopathy)     cath 12/13/10: Normal cors, EF 10-15%;  b. echo 5/12 EF 15%, mild MR, mod LAE, mild RVE, mild to mod RAE, mild to mod TR, PASP 44  . Systolic CHF, chronic   . NSVT (nonsustained ventricular tachycardia)     Life Vest; amiodarone rx  . Emphysema     Past Surgical History  Procedure Date  . Cardiac catheterization     ROS:  As stated in the HPI and negative for all other systems.  PHYSICAL EXAM BP 120/74  Pulse 62  Resp 16  Ht 5\' 9"  (1.753 m)  Wt 145 lb (65.772 kg)  BMI 21.41 kg/m2 GENERAL:  Well appearing HEENT:  Pupils equal round and reactive, fundi not visualized, oral mucosa unremarkable NECK:  No jugular venous distention, waveform within normal limits, carotid upstroke brisk and symmetric, no bruits, no  thyromegaly LYMPHATICS:  No cervical, inguinal adenopathy LUNGS:  Clear to auscultation bilaterally BACK:  No CVA tenderness CHEST:  Unremarkable HEART:  PMI not displaced or sustained,S1 and S2 within normal limits, no S3, no S4, no clicks, no rubs, no murmurs ABD:  Flat, positive bowel sounds normal in frequency in pitch, no bruits, no rebound, no guarding, no midline pulsatile mass, no hepatomegaly, no splenomegaly EXT:  2 plus pulses throughout, no edema, no cyanosis no clubbing SKIN:  No rashes no nodules NEURO:  Cranial nerves II through XII grossly intact, motor grossly intact throughout PSYCH:  Cognitively intact, oriented to person place and time  ASSESSMENT AND PLAN

## 2011-01-18 NOTE — Assessment & Plan Note (Signed)
I am delighted that he is not drinking. He will continue abstaining.

## 2011-01-18 NOTE — Assessment & Plan Note (Signed)
As above.

## 2011-01-18 NOTE — Assessment & Plan Note (Signed)
I suspect the cough is related to ACE inhibitor I will switch her to Cozaar 50 mg daily. I will see him in 2 weeks from a titration. We will get an echocardiogram in August and it is he is not better he will need an ICD. He will continue to wear the Life vest.

## 2011-01-18 NOTE — Patient Instructions (Signed)
Stop Enalapril  Start Cozaar 50 mg a day Continue all other medications as listed Follow up with Dr Antoine Poche in 2 weeks

## 2011-02-01 ENCOUNTER — Encounter: Payer: Self-pay | Admitting: Cardiology

## 2011-02-01 ENCOUNTER — Encounter: Payer: Self-pay | Admitting: *Deleted

## 2011-02-01 ENCOUNTER — Ambulatory Visit (INDEPENDENT_AMBULATORY_CARE_PROVIDER_SITE_OTHER): Payer: Managed Care, Other (non HMO) | Admitting: Cardiology

## 2011-02-01 DIAGNOSIS — I472 Ventricular tachycardia: Secondary | ICD-10-CM

## 2011-02-01 DIAGNOSIS — I429 Cardiomyopathy, unspecified: Secondary | ICD-10-CM

## 2011-02-01 DIAGNOSIS — I5022 Chronic systolic (congestive) heart failure: Secondary | ICD-10-CM

## 2011-02-01 DIAGNOSIS — F1011 Alcohol abuse, in remission: Secondary | ICD-10-CM

## 2011-02-01 DIAGNOSIS — I4729 Other ventricular tachycardia: Secondary | ICD-10-CM

## 2011-02-01 MED ORDER — CARVEDILOL 12.5 MG PO TABS: ORAL_TABLET | ORAL | Status: DC

## 2011-02-01 NOTE — Assessment & Plan Note (Signed)
He continues to abstain and I encourage this completely for the rest of his life.

## 2011-02-01 NOTE — Assessment & Plan Note (Signed)
As above.

## 2011-02-01 NOTE — Patient Instructions (Signed)
Please increase your Carvedilol 12.5 mg and 1 and 1/2 tablets twice a day Please continue all other medicaitons as listed Your physician has requested that you have an echocardiogram. Echocardiography is a painless test that uses sound waves to create images of your heart. It provides your doctor with information about the size and shape of your heart and how well your heart's chambers and valves are working. This procedure takes approximately one hour. There are no restrictions for this procedure. See Dr Antoine Poche that same day.

## 2011-02-01 NOTE — Assessment & Plan Note (Signed)
Today I will titrate his carvedilol to 18.75 b.i.d. She probably won't have a lot of room to move otherwise. Office I plan an echocardiogram and if his ejection fraction is still low he will need defibrillator. He will continue to wear his life vest until then.

## 2011-02-01 NOTE — Progress Notes (Signed)
HPI The patient presents for followup of his nonischemic cardiomyopathy. At the last appointment I changed him to Cozaar because of cough. He still coughing but it is. improved. He still staying away from alcohol and cigarettes. The patient denies any new symptoms such as chest discomfort, neck or arm discomfort. There has been no new shortness of breath, PND or orthopnea. There have been no presyncope or syncope.  He does have some palpitations.  No Known Allergies  Current Outpatient Prescriptions  Medication Sig Dispense Refill  . amiodarone (PACERONE) 200 MG tablet Take 200 mg by mouth 2 (two) times daily.        [name] Kitchen aspirin 81 MG tablet Take 81 mg by mouth daily.        . carvedilol (COREG) 12.5 MG tablet Take 1 tablet (12.5 mg total) by mouth 2 (two) times daily with a meal.  60 tablet  11  . furosemide (LASIX) 20 MG tablet Take 20 mg by mouth daily.        [name] Kitchen losartan (COZAAR) 50 MG tablet Take 1 tablet (50 mg total) by mouth daily.  30 tablet  11  . spironolactone (ALDACTONE) 25 MG tablet Take 1/2 tab for 3 days the 1 tab after  30 tablet  6    Past Medical History  Diagnosis Date  . NICM (nonischemic cardiomyopathy)     cath 12/13/10: Normal cors, EF 10-15%;  b. echo 5/12 EF 15%, mild MR, mod LAE, mild RVE, mild to mod RAE, mild to mod TR, PASP 44  . Systolic CHF, chronic   . NSVT (nonsustained ventricular tachycardia)     Life Vest; amiodarone rx  . Emphysema     Past Surgical History  Procedure Date  . Cardiac catheterization     ROS:  As stated in the HPI and negative for all other systems.  PHYSICAL EXAM BP 112/70  Pulse 56  Resp 16  Ht 5\' 9"  (1.753 m)  Wt 154 lb (69.854 kg)  BMI 22.74 kg/m2 GENERAL:  Well appearing HEENT:  Pupils equal round and reactive, fundi not visualized, oral mucosa unremarkable NECK:  No jugular venous distention, waveform within normal limits, carotid upstroke brisk and symmetric, no bruits, no thyromegaly LYMPHATICS:  No cervical, inguinal  adenopathy LUNGS:  Clear to auscultation bilaterally BACK:  No CVA tenderness CHEST:  Unremarkable HEART:  PMI not displaced or sustained,S1 and S2 within normal limits, no S3, no S4, no clicks, no rubs, no murmurs ABD:  Flat, positive bowel sounds normal in frequency in pitch, no bruits, no rebound, no guarding, no midline pulsatile mass, no hepatomegaly, no splenomegaly EXT:  2 plus pulses throughout, no edema, no cyanosis no clubbing SKIN:  No rashes no nodules NEURO:  Cranial nerves II through XII grossly intact, motor grossly intact throughout PSYCH:  Cognitively intact, oriented to person place and time  ASSESSMENT AND PLAN

## 2011-02-22 HISTORY — PX: OTHER SURGICAL HISTORY: SHX169

## 2011-03-13 ENCOUNTER — Other Ambulatory Visit (HOSPITAL_COMMUNITY): Payer: Self-pay | Admitting: Radiology

## 2011-03-14 ENCOUNTER — Ambulatory Visit (HOSPITAL_COMMUNITY): Payer: Managed Care, Other (non HMO) | Attending: Family Medicine | Admitting: Radiology

## 2011-03-14 ENCOUNTER — Ambulatory Visit (INDEPENDENT_AMBULATORY_CARE_PROVIDER_SITE_OTHER): Payer: Managed Care, Other (non HMO) | Admitting: Cardiology

## 2011-03-14 ENCOUNTER — Encounter: Payer: Self-pay | Admitting: Cardiology

## 2011-03-14 DIAGNOSIS — I059 Rheumatic mitral valve disease, unspecified: Secondary | ICD-10-CM | POA: Insufficient documentation

## 2011-03-14 DIAGNOSIS — I5022 Chronic systolic (congestive) heart failure: Secondary | ICD-10-CM

## 2011-03-14 DIAGNOSIS — I509 Heart failure, unspecified: Secondary | ICD-10-CM | POA: Insufficient documentation

## 2011-03-14 DIAGNOSIS — I079 Rheumatic tricuspid valve disease, unspecified: Secondary | ICD-10-CM | POA: Insufficient documentation

## 2011-03-14 DIAGNOSIS — F1011 Alcohol abuse, in remission: Secondary | ICD-10-CM

## 2011-03-14 DIAGNOSIS — F1010 Alcohol abuse, uncomplicated: Secondary | ICD-10-CM | POA: Insufficient documentation

## 2011-03-14 MED ORDER — LOSARTAN POTASSIUM 25 MG PO TABS: 25.0000 mg | ORAL_TABLET | Freq: Every evening | ORAL | Status: DC

## 2011-03-14 NOTE — Assessment & Plan Note (Addendum)
He continues to wear the life vest.

## 2011-03-14 NOTE — Assessment & Plan Note (Signed)
His EF is still not improved despite adequate med titration. Today I will try to increase the Cozaar by adding another 25 mg at night. However, he is at target. I cannot increase his beta blocker with his heart rate of 50. I will refer him to Dr. Ladona Ridgel for consideration of an ICD given his ongoing symptoms and reduced EF.

## 2011-03-14 NOTE — Patient Instructions (Signed)
Please increase your Cozaar by 25 mg at night. Continue all other medications as listed  Please Dr Lewayne Bunting for the evaluation of need for ICD placement.

## 2011-03-14 NOTE — Assessment & Plan Note (Signed)
I believe that he is no longer using ETOH.  His family confirms this.

## 2011-03-14 NOTE — Progress Notes (Signed)
HPI The patient presents for followup of his nonischemic cardiomyopathy. At the last appointment I changed his Coreg to 18.75 bid.  Today he came back for med titration and an echo.  His EF by echo is slightly better than 15% but still about 20% with global hypokinesis.  He reports Class II CHF symptoms.  He has had some palpitations but no syncope or presyncope.  He has had no chest pain, neck or arm pain.  He is avoiding all alcohol.  No Known Allergies  Current Outpatient Prescriptions  Medication Sig Dispense Refill  . amiodarone (PACERONE) 200 MG tablet Take 200 mg by mouth 2 (two) times daily.        [name] Kitchen aspirin 81 MG tablet Take 81 mg by mouth daily.        . carvedilol (COREG) 12.5 MG tablet Take 1 and 1/2 tablets twice a day  90 tablet  6  . furosemide (LASIX) 20 MG tablet Take 20 mg by mouth daily.        [name] Kitchen losartan (COZAAR) 50 MG tablet Take 1 tablet (50 mg total) by mouth daily.  30 tablet  11  . spironolactone (ALDACTONE) 25 MG tablet Take 1/2 tab for 3 days the 1 tab after  30 tablet  6    Past Medical History  Diagnosis Date  . NICM (nonischemic cardiomyopathy)     cath 12/13/10: Normal cors, EF 10-15%;  b. echo 5/12 EF 15%, mild MR, mod LAE, mild RVE, mild to mod RAE, mild to mod TR, PASP 44  . Systolic CHF, chronic   . NSVT (nonsustained ventricular tachycardia)     Life Vest; amiodarone rx  . Emphysema     Past Surgical History  Procedure Date  . Cardiac catheterization     ROS:  As stated in the HPI and negative for all other systems.  PHYSICAL EXAM BP 142/84  Pulse 51  Ht 5\' 9"  (1.753 m)  Wt 151 lb (68.493 kg)  BMI 22.30 kg/m2 GENERAL:  Well appearing HEENT:  Pupils equal round and reactive, fundi not visualized, oral mucosa unremarkable NECK:  No jugular venous distention, waveform within normal limits, carotid upstroke brisk and symmetric, no bruits, no thyromegaly LYMPHATICS:  No cervical, inguinal adenopathy LUNGS:  Clear to auscultation  bilaterally BACK:  No CVA tenderness CHEST:  Unchanged HEART:  PMI not displaced or sustained,S1 and S2 within normal limits, no S3, no S4, no clicks, no rubs, no murmurs ABD:  Flat, positive bowel sounds normal in frequency in pitch, no bruits, no rebound, no guarding, no midline pulsatile mass, no hepatomegaly, no splenomegaly EXT:  2 plus pulses throughout, no edema, no cyanosis no clubbing SKIN:  No rashes no nodules NEURO:  Cranial nerves II through XII grossly intact, motor grossly intact throughout PSYCH:  Cognitively intact, oriented to person place and time  ASSESSMENT AND PLAN

## 2011-03-15 ENCOUNTER — Encounter: Payer: Self-pay | Admitting: Internal Medicine

## 2011-03-15 ENCOUNTER — Encounter: Payer: Self-pay | Admitting: *Deleted

## 2011-03-15 ENCOUNTER — Ambulatory Visit (INDEPENDENT_AMBULATORY_CARE_PROVIDER_SITE_OTHER): Payer: Managed Care, Other (non HMO) | Admitting: Internal Medicine

## 2011-03-15 DIAGNOSIS — I5022 Chronic systolic (congestive) heart failure: Secondary | ICD-10-CM

## 2011-03-15 DIAGNOSIS — I4729 Other ventricular tachycardia: Secondary | ICD-10-CM

## 2011-03-15 DIAGNOSIS — I472 Ventricular tachycardia, unspecified: Secondary | ICD-10-CM

## 2011-03-15 NOTE — Patient Instructions (Signed)

## 2011-03-15 NOTE — Assessment & Plan Note (Signed)
I have discussed the treatment options with the patient and his sister who is with him today. He has 6 month history of class II to class III congestive heart failure with severe left ventricular dysfunction. 3 months after maximal medical therapy, his ejection fraction has improved from 15% to 20%. He has had documented nonsustained ventricular tachycardia. I discussed the treatment is with the patient. The risks, goals, benefits, and expectations of ICD implantation been discussed with the patient and he would like to proceed with this. It will be scheduled at the earliest possible convenience time.

## 2011-03-15 NOTE — Progress Notes (Signed)
HPI Mr. Mcmath is referred today for consideration for prophylactic ICD implantation. He is a very pleasant 50 year old man with a history of nonischemic cardiomyopathy, class II congestive heart failure on multiple medical therapies, nonsustained ventricular tachycardia associated with dizziness, on amiodarone therapy. The patient is one of life asked for the last 3 months. His symptoms are much better. That said he remains class II despite maximal medical therapy and medication titration under the direction of Dr. Antoine Poche. He has had no frank syncope. He denies chest pain at rest. No Known Allergies   Current Outpatient Prescriptions  Medication Sig Dispense Refill  . amiodarone (PACERONE) 200 MG tablet Take 200 mg by mouth 2 (two) times daily.        [name] Kitchen aspirin 81 MG tablet Take 81 mg by mouth daily.        . carvedilol (COREG) 12.5 MG tablet Take 1 and 1/2 tablets twice a day  90 tablet  6  . furosemide (LASIX) 20 MG tablet Take 20 mg by mouth daily.        [name] Kitchen losartan (COZAAR) 25 MG tablet Take 1 tablet (25 mg total) by mouth every evening.  30 tablet  11  . losartan (COZAAR) 50 MG tablet Take 1 tablet (50 mg total) by mouth daily.  30 tablet  11  . spironolactone (ALDACTONE) 25 MG tablet Take 1/2 tab for 3 days the 1 tab after  30 tablet  6     Past Medical History  Diagnosis Date  . NICM (nonischemic cardiomyopathy)     cath 12/13/10: Normal cors, EF 10-15%;  b. echo 5/12 EF 15%, mild MR, mod LAE, mild RVE, mild to mod RAE, mild to mod TR, PASP 44  . Systolic CHF, chronic   . NSVT (nonsustained ventricular tachycardia)     Life Vest; amiodarone rx  . Emphysema     ROS:   All systems reviewed and negative except as noted in the HPI.   Past Surgical History  Procedure Date  . Cardiac catheterization      Family History  Problem Relation Age of Onset  . Cancer       History   Social History  . Marital Status: Married    Spouse Name: N/A    Number of Children: N/A  .  Years of Education: N/A   Occupational History  . Not on file.   Social History Main Topics  . Smoking status: Former Smoker -- 1.0 packs/day for 15 years    Quit date: 10/23/2010  . Smokeless tobacco: Not on file  . Alcohol Use: No  . Drug Use: No  . Sexually Active: Not on file   Other Topics Concern  . Not on file   Social History Narrative    Mr. Vanecek is married and lives with his wife.  They have  1 child together.  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  He quit alcohol 1 month ago, after a 20-year history  of what he describes as social alcohol, but does endorse drinking 6-8  beers per day.        BP 130/72  Pulse 60  Ht 5\' 9"  (1.753 m)  Wt 155 lb (70.308 kg)  BMI 22.89 kg/m2  Physical Exam:  Well appearing young man,NAD HEENT: Unremarkable Neck:   JVD 7cm., no thyromegally Lymphatics:  No adenopathy Back:  No CVA tenderness Lungs:  Clear with no wheezes, rales, or rhonchi. HEART:  Regular rate rhythm, no murmurs, no rubs, no clicks. PMI is enlarged and laterally displaced. No S3 is present. Abd:  soft, positive bowel sounds, no organomegally, no rebound, no guarding Ext:  2 plus pulses, no edema, no cyanosis, no clubbing Skin:  No rashes no nodules Neuro:  CN II through XII intact, motor grossly intact   DEVICE  Normal device function.  See PaceArt for details.   Assess/Plan:

## 2011-03-15 NOTE — Assessment & Plan Note (Signed)
For now, he will continue amiodarone therapy. His nonsustained VT has been minimally symptomatic. My expectation will be that after ICD implantation, the amiodarone will be discontinued.

## 2011-03-24 ENCOUNTER — Encounter: Payer: Self-pay | Admitting: *Deleted

## 2011-03-28 ENCOUNTER — Encounter: Payer: Self-pay | Admitting: Cardiology

## 2011-04-03 ENCOUNTER — Encounter: Payer: Self-pay | Admitting: Internal Medicine

## 2011-04-06 ENCOUNTER — Ambulatory Visit (HOSPITAL_COMMUNITY): Payer: Managed Care, Other (non HMO)

## 2011-04-06 ENCOUNTER — Ambulatory Visit (HOSPITAL_COMMUNITY)
Admission: RE | Admit: 2011-04-06 | Discharge: 2011-04-07 | Disposition: A | Discharge status: Home / Self Care | Payer: Managed Care, Other (non HMO) | Source: Ambulatory Visit | Attending: Internal Medicine | Admitting: Internal Medicine

## 2011-04-06 DIAGNOSIS — I509 Heart failure, unspecified: Secondary | ICD-10-CM | POA: Insufficient documentation

## 2011-04-06 DIAGNOSIS — I428 Other cardiomyopathies: Secondary | ICD-10-CM

## 2011-04-06 DIAGNOSIS — I5022 Chronic systolic (congestive) heart failure: Secondary | ICD-10-CM | POA: Insufficient documentation

## 2011-04-06 DIAGNOSIS — J438 Other emphysema: Secondary | ICD-10-CM | POA: Insufficient documentation

## 2011-04-06 LAB — SURGICAL PCR SCREEN: MRSA, PCR: NEGATIVE

## 2011-04-07 ENCOUNTER — Ambulatory Visit (HOSPITAL_COMMUNITY): Payer: Managed Care, Other (non HMO)

## 2011-04-17 ENCOUNTER — Ambulatory Visit: Payer: Managed Care, Other (non HMO) | Admitting: *Deleted

## 2011-04-20 ENCOUNTER — Encounter: Payer: Self-pay | Admitting: Internal Medicine

## 2011-04-20 ENCOUNTER — Ambulatory Visit (INDEPENDENT_AMBULATORY_CARE_PROVIDER_SITE_OTHER): Payer: Managed Care, Other (non HMO) | Admitting: *Deleted

## 2011-04-20 DIAGNOSIS — R000 Tachycardia, unspecified: Secondary | ICD-10-CM

## 2011-04-20 LAB — ICD DEVICE OBSERVATION
BRDY-0002RV: 40 {beats}/min
DEV-0020ICD: NEGATIVE
FVT: 0
HV IMPEDENCE: 60 Ohm
TOT-0007: 1
TOT-0010: 1
VENTRICULAR PACING ICD: 0.02 pct
VF: 0

## 2011-04-20 NOTE — Progress Notes (Signed)
icd checked in clinic 

## 2011-05-04 NOTE — Op Note (Signed)
NAMEARTHUR, George Barber                ACCOUNT NO.:  [id number]  MEDICAL RECORD NO.:  [id number]  LOCATION:  [facility]                         FACILITY:  [facility]  PHYSICIAN:  Doylene Canning. Ladona Ridgel, MD    DATE OF BIRTH:  1961/01/25  DATE OF PROCEDURE:  04/06/2011 DATE OF DISCHARGE:                              OPERATIVE REPORT   ELECTROPHYSIOLOGIC PROCEDURE NOTE  PROCEDURE PERFORMED:  Insertion of a prophylactic implantable cardioverter-defibrillator.  INDICATION:  Longstanding nonischemic cardiomyopathy, longstanding ejection fraction of 15% despite maximal medical therapy with carvedilol, furosemide, losartan, and Aldactone.  INTRODUCTION:  The patient is a very pleasant 50 year old man with longstanding systolic heart failure.  He has a nonischemic cardiomyopathy and despite maximal medical therapy, his ejection fraction is 15% and his heart failure class is class II.  He does have a history of nonsustained VT.  He is now referred for prophylactic ICD implantation.  Of note, the patient has never had syncope.  PROCEDURE:  After informed consent was obtained, the patient was taken to diagnostic EP lab in fasting state.  After usual preparation and draping, intravenous fentanyl and midazolam were given for sedation.  A 30 mL of lidocaine was infiltrated into the left infraclavicular region. A 5-cm incision was carried out over this region, and electrocautery was utilized to dissect down to the fascial plane.  Multiple attempts to puncture the left subclavian vein were unsuccessful.  A 10 mL of contrast was injected into the left upper extremity venous system demonstrating that the vein was displaced caudally.  It was subsequently punctured and the Guidant [employer] Gore-Tex single coil defibrillation lead, serial number [id number] was advanced into the right ventricle.  Mapping was carried out at the final site, the R-waves measured 10 mV, the pace impedance was 500 ohms, and the  threshold was 1 volt at 0.5 milliseconds.  The 10-volt pacing did not stimulate the diaphragm.  With these satisfactory parameters, the lead was secured to the subpectoralis fascia with figure-of-eight silk suture and the sewing sleeve was secured with silk suture.  Electrocautery was utilized to make a subcutaneous pocket.  Antibiotic irrigation was utilized to irrigate the pocket.  The St. Jude single-chamber defibrillator, serial number [id number], was connected to the defibrillation lead and placed back in the subcutaneous pocket.  Pocket was irrigated with antibiotic irrigation and defibrillation threshold testing was carried out.  After the patient was more deeply sedated with fentanyl and Versed, VF was induced with a T-wave shock.  A 15-joule shock was initially delivered which failed to terminate VF.  A 25-joule shock was then delivered terminating ventricular fibrillation and restoring sinus rhythm.  Appropriate sensing was demonstrated throughout the induction. At this point, the incision was closed with 2-0 and 3-0 Vicryl. Benzoin, Steri-Strips were painted on the skin.  A pressure dressing was applied, and the patient was returned to his room in satisfactory condition.  COMPLICATIONS:  There were no immediate procedure complications.  RESULTS:  This demonstrates successful implantation of a St. Jude single- chamber defibrillator utilizing a single-coil Guidant Gore-Tex lead.     Doylene Canning. Ladona Ridgel, MD     GWT/MEDQ  D:  04/06/2011  T:  04/06/2011  Job:  [id number]  cc:   Rollene Rotunda, MD, [facility]  Electronically Signed by Lewayne Bunting MD on 05/04/2011 06:43:25 PM

## 2011-05-04 NOTE — Discharge Summary (Signed)
NAMEAEMON, KOELLER                ACCOUNT NO.:  [id number]  MEDICAL RECORD NO.:  [id number]  LOCATION:  3711                         FACILITY:  [facility]  PHYSICIAN:  Doylene Canning. Ladona Ridgel, MD    DATE OF BIRTH:  01/07/1961  DATE OF ADMISSION:  04/06/2011 DATE OF DISCHARGE:  04/07/2011                              DISCHARGE SUMMARY   PRIMARY CARDIOLOGIST:  Rollene Rotunda, MD, [facility]  ELECTROPHYSIOLOGIST:  Doylene Canning. Ladona Ridgel, MD  PRIMARY DIAGNOSIS:  Nonischemic cardiomyopathy despite optimal medical therapy.  SECONDARY DIAGNOSES: 1. Nonsustained ventricular tachycardia - on amiodarone therapy. 2. Chronic systolic heart failure. 3. Emphysema.  ALLERGIES:  The patient has no known drug allergies.  PROCEDURES THIS ADMISSION: 1. Implantation of a St. Jude medical single chamber implantable     cardiac defibrillator on April 06, 2011 by Dr. Ladona Ridgel.  The     patient received a St. Jude medical model [phone] ICD with model     #0180 right ventricular lead.  DFTs at time of implant were less     than or equal to 15-25 joules.  The patient had no early apparent     complications. 2. Chest x-ray on April 07, 2011 demonstrated no pneumothorax     status post device implant.  BRIEF HISTORY OF PRESENT ILLNESS:  Mr. Letizia is a 50 year old male with a history of nonischemic cardiomyopathy, class II congestive heart failure, and nonsustained ventricular tachycardia.  He was referred to Dr. Ladona Ridgel for consideration of prophylactic ICD implantation.  He has worn a LifeVest for the last 2 months.  Despite maximum medical therapy, his ejection fraction remained less than 35%.  Risks, benefits, and alternatives of ICD implantation were reviewed with the patient and he wished to proceed.  HOSPITAL COURSE:  The patient was admitted on April 06, 2011 for planned implantation of a implantable cardiac defibrillator.  This was carried out by Dr. Ladona Ridgel with details as outlined above.  He  was monitored on telemetry overnight which demonstrated sinus rhythm.  His left chest was without hematoma or ecchymosis.  Chest x-ray demonstrated no pneumothorax status post device implant.  His device was interrogated and found to be functioning normally.  Dr. Ladona Ridgel felt he was stable for discharge to home.  FOLLOWUP APPOINTMENTS: 1. [facility] on April 17, 2011 at 11:30     a.m. 2. Dr. Ladona Ridgel on July 06, 2011 at 9:30 a.m. 3. Dr. Antoine Poche as scheduled.  DISCHARGE INSTRUCTIONS: 1. Increase activity slowly. 2. No driving for 1 week. 3. Follow a low sodium heart-healthy diet. 4. See supplemental device discharge instructions for wound care and     arm mobility. 5. Keep incisions clean and dry for 1 week.  DISCHARGE MEDICATIONS: 1. Aspirin 81 mg 1 tablet daily. 2. Amiodarone 200 mg 1 tablet twice daily. 3. Carvedilol 12.5 mg 1/2 tablet twice daily. 4. Cozaar 25 mg 1 tablet every evening. 5. Cozaar 50 mg 1 tablet daily. 6. Furosemide 40 mg daily. 7. Spironolactone 25 mg daily.  DISPOSITION:  The patient was seen and examined by Dr. Ladona Ridgel on April 07, 2011 and considered stable for discharge.  DURATION OF DISCHARGE ENCOUNTER:  35 minutes.  Gypsy Balsam, RN,BSN   ______________________________ Doylene Canning. Ladona Ridgel, MD    AS/MEDQ  D:  04/07/2011  T:  04/07/2011  Job:  [id number]  cc:   Rollene Rotunda, MD, [facility]  Electronically Signed by Gypsy Balsam RNBSN on 04/13/2011 09:30:23 AM Electronically Signed by Lewayne Bunting MD on 05/04/2011 06:43:21 PM

## 2011-07-04 ENCOUNTER — Encounter: Payer: Self-pay | Admitting: Internal Medicine

## 2011-07-06 ENCOUNTER — Ambulatory Visit (INDEPENDENT_AMBULATORY_CARE_PROVIDER_SITE_OTHER): Payer: Managed Care, Other (non HMO) | Admitting: Internal Medicine

## 2011-07-06 ENCOUNTER — Encounter: Payer: Self-pay | Admitting: Internal Medicine

## 2011-07-06 DIAGNOSIS — N529 Male erectile dysfunction, unspecified: Secondary | ICD-10-CM | POA: Insufficient documentation

## 2011-07-06 DIAGNOSIS — I472 Ventricular tachycardia, unspecified: Secondary | ICD-10-CM

## 2011-07-06 DIAGNOSIS — I5022 Chronic systolic (congestive) heart failure: Secondary | ICD-10-CM

## 2011-07-06 DIAGNOSIS — I4729 Other ventricular tachycardia: Secondary | ICD-10-CM

## 2011-07-06 DIAGNOSIS — I429 Cardiomyopathy, unspecified: Secondary | ICD-10-CM

## 2011-07-06 LAB — ICD DEVICE OBSERVATION
BRDY-0002RV: 40 {beats}/min
DEV-0020ICD: NEGATIVE
HV IMPEDENCE: 74 Ohm
PACEART VT: 0
RV LEAD IMPEDENCE ICD: 400 Ohm
RV LEAD THRESHOLD: 1.25 V
TOT-0009: 1
TOT-0010: 1
VENTRICULAR PACING ICD: 0.01 pct
VF: 0

## 2011-07-06 MED ORDER — SILDENAFIL CITRATE 50 MG PO TABS: 50.0000 mg | ORAL_TABLET | Freq: Every day | ORAL | Status: DC | PRN

## 2011-07-06 NOTE — Assessment & Plan Note (Signed)
He has had no sustained ventricular arrhythmias. He will continue his current medical therapy. 

## 2011-07-06 NOTE — Assessment & Plan Note (Signed)
He has been prescribed Viagra. He has been instructed not to use any nitroglycerin.

## 2011-07-06 NOTE — Patient Instructions (Signed)
Your physician recommends that you schedule a follow-up appointment in: 3 months with the device clinic  Your physician wants you to follow-up in: 12 months with Dr Court Joy will receive a reminder letter in the mail two months in advance. If you don't receive a letter, please call our office to schedule the follow-up appointment.

## 2011-07-06 NOTE — Assessment & Plan Note (Signed)
His symptoms are class 1-2. He'll continue his current medical therapy.

## 2011-07-06 NOTE — Progress Notes (Signed)
HPI [name] returns today for followup. He is a very pleasant 50 year old man with a history of a nonischemic cardiomyopathy, chronic systolic heart failure despite maximal medical therapy, status post ICD implantation. He complains today of erectile dysfunction. He denies chest pain or shortness of breath. He notes mild dizziness when he stands. No frank syncope. No ICD shock. No Known Allergies   Current Outpatient Prescriptions  Medication Sig Dispense Refill  . amiodarone (PACERONE) 200 MG tablet Take 200 mg by mouth 2 (two) times daily.        [name] Kitchen aspirin 81 MG tablet Take 81 mg by mouth daily.        . carvedilol (COREG) 12.5 MG tablet Take 1 and 1/2 tablets twice a day  90 tablet  6  . furosemide (LASIX) 20 MG tablet Take 20 mg by mouth daily.        [name] Kitchen losartan (COZAAR) 25 MG tablet Take 1 tablet (25 mg total) by mouth every evening.  30 tablet  11  . losartan (COZAAR) 50 MG tablet Take 1 tablet (50 mg total) by mouth daily.  30 tablet  11  . spironolactone (ALDACTONE) 25 MG tablet Take 1/2 tab for 3 days the 1 tab after  30 tablet  6     Past Medical History  Diagnosis Date  . NICM (nonischemic cardiomyopathy)     cath 12/13/10: Normal cors, EF 10-15%;  b. echo 5/12 EF 15%, mild MR, mod LAE, mild RVE, mild to mod RAE, mild to mod TR, PASP 44  . Systolic CHF, chronic   . NSVT (nonsustained ventricular tachycardia)     Life Vest; amiodarone rx  . Emphysema   . Fluttering heart     ROS:   All systems reviewed and negative except as noted in the HPI.   Past Surgical History  Procedure Date  . Cardiac catheterization   . 2d echocardiogram 02/2011     Family History  Problem Relation Age of Onset  . Cancer Mother 26  . Coronary artery disease Father     unknown  . Emphysema Father      History   Social History  . Marital Status: Married    Spouse Name: N/A    Number of Children: 1  . Years of Education: N/A   Occupational History  . ELECTRICAL    Social  History Main Topics  . Smoking status: Former Smoker -- 1.0 packs/day for 15 years    Quit date: 10/23/2010  . Smokeless tobacco: Not on file  . Alcohol Use: No  . Drug Use: No  . Sexually Active: Not on file   Other Topics Concern  . Not on file   Social History Narrative    [name] is married and lives with his wife.  They have  1 child together.  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  He quit alcohol 1 month ago, after a 20-year history  of what he describes as social alcohol, but does endorse drinking 6-8  beers per day.        BP 122/68  Pulse 45  Ht 5\' 9"  (1.753 m)  Wt 71.269 kg (157 lb 1.9 oz)  BMI 23.20 kg/m2  SpO2 98%  Physical Exam:  Well appearing middle-aged man, NAD HEENT: Unremarkable Neck:  No JVD, no thyromegally Lymphatics:  No adenopathy Back:  No CVA tenderness Lungs:  Clear no wheezes, rales, or rhonchi. HEART:  Regular  rate rhythm, no murmurs, no rubs, no clicks Abd:  soft, positive bowel sounds, no organomegally, no rebound, no guarding Ext:  2 plus pulses, no edema, no cyanosis, no clubbing Skin:  No rashes no nodules Neuro:  CN II through XII intact, motor grossly intact  DEVICE  Normal device function.  See PaceArt for details.   Assess/Plan:

## 2011-10-10 ENCOUNTER — Encounter: Payer: Self-pay | Admitting: Internal Medicine

## 2011-10-10 ENCOUNTER — Ambulatory Visit (INDEPENDENT_AMBULATORY_CARE_PROVIDER_SITE_OTHER): Payer: Managed Care, Other (non HMO) | Admitting: Internal Medicine

## 2011-10-10 VITALS — BP 120/78 | HR 58 | Ht 70.0 in | Wt 155.1 lb

## 2011-10-10 DIAGNOSIS — I4729 Other ventricular tachycardia: Secondary | ICD-10-CM

## 2011-10-10 DIAGNOSIS — I472 Ventricular tachycardia: Secondary | ICD-10-CM

## 2011-10-10 DIAGNOSIS — Z95810 Presence of automatic (implantable) cardiac defibrillator: Secondary | ICD-10-CM | POA: Insufficient documentation

## 2011-10-10 DIAGNOSIS — I429 Cardiomyopathy, unspecified: Secondary | ICD-10-CM

## 2011-10-10 DIAGNOSIS — I5022 Chronic systolic (congestive) heart failure: Secondary | ICD-10-CM

## 2011-10-10 LAB — ICD DEVICE OBSERVATION
CHARGE TIME: 7.3 s
DEV-0020ICD: NEGATIVE
HV IMPEDENCE: 77 Ohm
RV LEAD AMPLITUDE: 11.8 mv
RV LEAD IMPEDENCE ICD: 400 Ohm
RV LEAD THRESHOLD: 1.25 V
TOT-0008: 0
TOT-0009: 1
TOT-0010: 1

## 2011-10-10 NOTE — Patient Instructions (Signed)
Your physician wants you to follow-up in: 12 months with Dr Court Joy will receive a reminder letter in the mail two months in advance. If you don't receive a letter, please call our office to schedule the follow-up appointment.   Remote monitoring is used to monitor your Pacemaker of ICD from home. This monitoring reduces the number of office visits required to check your device to one time per year. It allows [place] to keep an eye on the functioning of your device to ensure it is working properly. You are scheduled for a device check from home on 01/11/2012. You may send your transmission at any time that day. If you have a wireless device, the transmission will be sent automatically. After your physician reviews your transmission, you will receive a postcard with your next transmission date.   Your physician has recommended you make the following change in your medication:  1) Decrease Amiodarone to 200mg  daily

## 2011-10-10 NOTE — Assessment & Plan Note (Signed)
His device is working normally. We'll plan to recheck in several months. 

## 2011-10-10 NOTE — Progress Notes (Signed)
HPI [name] returns today for followup. He is a 51 year old man with a long-standing nonischemic cardiomyopathy, chronic class II systolic heart failure, status post ICD implantation. The patient denies chest pain, shortness of breath, or syncope. No peripheral edema. He works a regular job and only notes trouble with dizziness, if he stands up quickly. No Known Allergies   Current Outpatient Prescriptions  Medication Sig Dispense Refill  . amiodarone (PACERONE) 200 MG tablet Take 1 tablet (200 mg total) by mouth daily.      [name] Kitchen aspirin 81 MG tablet Take 81 mg by mouth daily.        . carvedilol (COREG) 12.5 MG tablet Take 1 and 1/2 tablets twice a day  90 tablet  6  . furosemide (LASIX) 20 MG tablet Take 20 mg by mouth daily.        [name] Kitchen losartan (COZAAR) 25 MG tablet Take 1 tablet (25 mg total) by mouth every evening.  30 tablet  11  . losartan (COZAAR) 50 MG tablet Take 1 tablet (50 mg total) by mouth daily.  30 tablet  11  . sildenafil (VIAGRA) 50 MG tablet Take 1 tablet (50 mg total) by mouth daily as needed for erectile dysfunction.  5 tablet  3  . spironolactone (ALDACTONE) 25 MG tablet Take 1/2 tab for 3 days the 1 tab after  30 tablet  6  . DISCONTD: amiodarone (PACERONE) 200 MG tablet Take 200 mg by mouth 2 (two) times daily.           Past Medical History  Diagnosis Date  . NICM (nonischemic cardiomyopathy)     cath 12/13/10: Normal cors, EF 10-15%;  b. echo 5/12 EF 15%, mild MR, mod LAE, mild RVE, mild to mod RAE, mild to mod TR, PASP 44  . Systolic CHF, chronic   . NSVT (nonsustained ventricular tachycardia)     Life Vest; amiodarone rx  . Emphysema   . Fluttering heart     ROS:   All systems reviewed and negative except as noted in the HPI.   Past Surgical History  Procedure Date  . Cardiac catheterization   . 2d echocardiogram 02/2011     Family History  Problem Relation Age of Onset  . Cancer Mother 24  . Coronary artery disease Father     unknown  . Emphysema  Father      History   Social History  . Marital Status: Married    Spouse Name: N/A    Number of Children: 1  . Years of Education: N/A   Occupational History  . ELECTRICAL    Social History Main Topics  . Smoking status: Former Smoker -- 1.0 packs/day for 15 years    Quit date: 10/23/2010  . Smokeless tobacco: Not on file  . Alcohol Use: No  . Drug Use: No  . Sexually Active: Not on file   Other Topics Concern  . Not on file   Social History Narrative    [name] is married and lives with his wife.  They have  1 child together.  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  He quit alcohol 1 month ago, after a 20-year history  of what he describes as social alcohol, but does endorse drinking 6-8  beers per day.        BP 120/78  Pulse 58  Ht 5\' 10"  (1.778 m)  Wt 70.362 kg (155 lb 1.9 oz)  BMI 22.26 kg/m2  Physical Exam:  Well appearing NAD HEENT: Unremarkable Neck:  No JVD, no thyromegally Lymphatics:  No adenopathy Back:  No CVA tenderness Lungs:  Clear with no wheezes, rales or rhonchi HEART:  Regular rate rhythm, no murmurs, no rubs, no clicks Abd:  soft, positive bowel sounds, no organomegally, no rebound, no guarding Ext:  2 plus pulses, no edema, no cyanosis, no clubbing Skin:  No rashes no nodules Neuro:  CN II through XII intact, motor grossly intact  DEVICE  Normal device function.  See PaceArt for details.   Assess/Plan:

## 2011-10-10 NOTE — Assessment & Plan Note (Signed)
His current symptoms are well controlled. He will continue his current medical therapy, maintain a low-sodium diet, and remain active.

## 2011-10-10 NOTE — Assessment & Plan Note (Addendum)
He has had no recurrent symptomatic ventricular arrhythmias. No change in medical therapy except that we will reduce his amiodarone to 200 mg daily.

## 2011-12-13 ENCOUNTER — Other Ambulatory Visit: Payer: Self-pay | Admitting: Physician Assistant

## 2012-01-11 ENCOUNTER — Other Ambulatory Visit: Payer: Self-pay | Admitting: Physician Assistant

## 2012-01-11 ENCOUNTER — Encounter: Payer: Self-pay | Admitting: Internal Medicine

## 2012-01-11 ENCOUNTER — Ambulatory Visit (INDEPENDENT_AMBULATORY_CARE_PROVIDER_SITE_OTHER): Payer: Managed Care, Other (non HMO) | Admitting: *Deleted

## 2012-01-11 ENCOUNTER — Other Ambulatory Visit: Payer: Self-pay

## 2012-01-11 DIAGNOSIS — I429 Cardiomyopathy, unspecified: Secondary | ICD-10-CM

## 2012-01-11 DIAGNOSIS — Z95810 Presence of automatic (implantable) cardiac defibrillator: Secondary | ICD-10-CM

## 2012-01-11 DIAGNOSIS — I4729 Other ventricular tachycardia: Secondary | ICD-10-CM

## 2012-01-11 DIAGNOSIS — I472 Ventricular tachycardia: Secondary | ICD-10-CM

## 2012-01-11 MED ORDER — AMIODARONE HCL 200 MG PO TABS: 200.0000 mg | ORAL_TABLET | Freq: Every day | ORAL | Status: DC

## 2012-01-16 ENCOUNTER — Other Ambulatory Visit: Payer: Self-pay | Admitting: Physician Assistant

## 2012-01-16 LAB — REMOTE ICD DEVICE
BRDY-0002RV: 40 {beats}/min
DEVICE MODEL ICD: 1005776
HV IMPEDENCE: 70 Ohm
RV LEAD IMPEDENCE ICD: 390 Ohm
VENTRICULAR PACING ICD: 1 pct

## 2012-01-22 ENCOUNTER — Encounter: Payer: Self-pay | Admitting: *Deleted

## 2012-01-31 ENCOUNTER — Other Ambulatory Visit: Payer: Self-pay | Admitting: Cardiology

## 2012-01-31 NOTE — Telephone Encounter (Signed)
..   Requested Prescriptions   Pending Prescriptions Disp Refills  . losartan (COZAAR) 50 MG tablet [Pharmacy Med Name: LOSARTAN 50MG  TAB] 30 tablet 6    Sig: TAKE ONE TABLET BY MOUTH EVERY DAY

## 2012-04-10 ENCOUNTER — Other Ambulatory Visit: Payer: Self-pay | Admitting: Cardiology

## 2012-04-11 ENCOUNTER — Other Ambulatory Visit: Payer: Self-pay | Admitting: Cardiology

## 2012-04-15 ENCOUNTER — Ambulatory Visit (INDEPENDENT_AMBULATORY_CARE_PROVIDER_SITE_OTHER): Payer: Managed Care, Other (non HMO) | Admitting: *Deleted

## 2012-04-15 ENCOUNTER — Other Ambulatory Visit: Payer: Self-pay

## 2012-04-15 ENCOUNTER — Encounter: Payer: Self-pay | Admitting: Internal Medicine

## 2012-04-15 DIAGNOSIS — Z95810 Presence of automatic (implantable) cardiac defibrillator: Secondary | ICD-10-CM

## 2012-04-15 DIAGNOSIS — I429 Cardiomyopathy, unspecified: Secondary | ICD-10-CM

## 2012-04-18 ENCOUNTER — Other Ambulatory Visit: Payer: Self-pay

## 2012-04-18 ENCOUNTER — Encounter: Payer: Self-pay | Admitting: *Deleted

## 2012-04-18 LAB — REMOTE ICD DEVICE
BRDY-0002RV: 40 {beats}/min
DEV-0020ICD: NEGATIVE
HV IMPEDENCE: 74 Ohm
RV LEAD IMPEDENCE ICD: 390 Ohm

## 2012-04-18 MED ORDER — LOSARTAN POTASSIUM 50 MG PO TABS: 50.0000 mg | ORAL_TABLET | Freq: Every day | ORAL | Status: DC

## 2012-04-18 NOTE — Telephone Encounter (Signed)
..   Requested Prescriptions   Signed Prescriptions Disp Refills  . losartan (COZAAR) 50 MG tablet 30 tablet 12    Sig: Take 1 tablet (50 mg total) by mouth daily.    Authorizing Provider: Rollene Rotunda    Ordering User: Christella Hartigan, Lakhia Gengler Judie Petit

## 2012-05-01 ENCOUNTER — Encounter: Payer: Self-pay | Admitting: *Deleted

## 2012-07-09 ENCOUNTER — Other Ambulatory Visit: Payer: Self-pay | Admitting: Internal Medicine

## 2012-07-19 ENCOUNTER — Other Ambulatory Visit: Payer: Self-pay | Admitting: Internal Medicine

## 2012-07-22 ENCOUNTER — Encounter: Payer: Managed Care, Other (non HMO) | Admitting: *Deleted

## 2012-07-22 ENCOUNTER — Encounter: Payer: Self-pay | Admitting: Internal Medicine

## 2012-07-22 DIAGNOSIS — I4729 Other ventricular tachycardia: Secondary | ICD-10-CM

## 2012-07-22 DIAGNOSIS — I472 Ventricular tachycardia: Secondary | ICD-10-CM

## 2012-07-25 LAB — REMOTE ICD DEVICE
BRDY-0002RV: 40 {beats}/min
DEV-0020ICD: NEGATIVE
HV IMPEDENCE: 70 Ohm
RV LEAD AMPLITUDE: 11.8 mv
RV LEAD IMPEDENCE ICD: 390 Ohm

## 2012-08-06 ENCOUNTER — Encounter: Payer: Self-pay | Admitting: *Deleted

## 2012-08-08 ENCOUNTER — Other Ambulatory Visit: Payer: Self-pay | Admitting: Internal Medicine

## 2012-10-17 ENCOUNTER — Encounter: Payer: Self-pay | Admitting: Internal Medicine

## 2012-10-17 ENCOUNTER — Ambulatory Visit (INDEPENDENT_AMBULATORY_CARE_PROVIDER_SITE_OTHER): Payer: BC Managed Care – PPO | Admitting: Internal Medicine

## 2012-10-17 VITALS — BP 131/89 | HR 51 | Ht 70.0 in | Wt 150.6 lb

## 2012-10-17 DIAGNOSIS — Z95810 Presence of automatic (implantable) cardiac defibrillator: Secondary | ICD-10-CM

## 2012-10-17 DIAGNOSIS — I472 Ventricular tachycardia: Secondary | ICD-10-CM

## 2012-10-17 DIAGNOSIS — I429 Cardiomyopathy, unspecified: Secondary | ICD-10-CM

## 2012-10-17 DIAGNOSIS — I4729 Other ventricular tachycardia: Secondary | ICD-10-CM

## 2012-10-17 DIAGNOSIS — I5022 Chronic systolic (congestive) heart failure: Secondary | ICD-10-CM

## 2012-10-17 LAB — ICD DEVICE OBSERVATION
BRDY-0002RV: 40 {beats}/min
DEV-0020ICD: NEGATIVE
HV IMPEDENCE: 77 Ohm
RV LEAD AMPLITUDE: 11.8 mv
RV LEAD IMPEDENCE ICD: 387.5 Ohm
RV LEAD THRESHOLD: 1 V
TOT-0009: 1
TOT-0010: 1
VENTRICULAR PACING ICD: 0.03 pct
VF: 0

## 2012-10-17 NOTE — Progress Notes (Signed)
HPI George Barber returns today for followup. He is a very pleasant 52 year old man with a history of ventricular tachycardia, status post ICD insertion. He has a nonischemic cardiomyopathy with chronic systolic heart failure currently class II. In the interim, he has done well. He denies chest pain, shortness of breath, or syncope. No peripheral edema. No Known Allergies   Current Outpatient Prescriptions  Medication Sig Dispense Refill  . aspirin 81 MG tablet Take 81 mg by mouth daily.        . carvedilol (COREG) 12.5 MG tablet Take by mouth 1 and 1/2 tablet twice a day.  90 tablet  5  . carvedilol (COREG) 12.5 MG tablet Take 12.5 mg by mouth 2 (two) times daily with a meal. Pt has been taking 1 tab in am and pm      . furosemide (LASIX) 20 MG tablet TAKE ONE TABLET BY MOUTH EVERY DAY  30 tablet  5  . losartan (COZAAR) 50 MG tablet Take 1 tablet (50 mg total) by mouth daily.  30 tablet  12  . PACERONE 200 MG tablet TAKE ONE TABLET BY MOUTH EVERY DAY  30 tablet  5  . spironolactone (ALDACTONE) 25 MG tablet TAKE ONE TABLET BY MOUTH EVERY DAY  30 tablet  4  . losartan (COZAAR) 25 MG tablet Take 1 tablet (25 mg total) by mouth every evening.  30 tablet  11  . sildenafil (VIAGRA) 50 MG tablet Take 1 tablet (50 mg total) by mouth daily as needed for erectile dysfunction.  5 tablet  3   No current facility-administered medications for this visit.     Past Medical History  Diagnosis Date  . NICM (nonischemic cardiomyopathy)     cath 12/13/10: Normal cors, EF 10-15%;  b. echo 5/12 EF 15%, mild MR, mod LAE, mild RVE, mild to mod RAE, mild to mod TR, PASP 44  . Systolic CHF, chronic   . NSVT (nonsustained ventricular tachycardia)     Life Vest; amiodarone rx  . Emphysema   . Fluttering heart     ROS:   All systems reviewed and negative except as noted in the HPI.   Past Surgical History  Procedure Laterality Date  . Cardiac catheterization    . 2d echocardiogram  02/2011     Family History   Problem Relation Age of Onset  . Cancer Mother 57  . Coronary artery disease Father     unknown  . Emphysema Father      History   Social History  . Marital Status: Married    Spouse Name: N/A    Number of Children: 1  . Years of Education: N/A   Occupational History  . ELECTRICAL    Social History Main Topics  . Smoking status: Former Smoker -- 1.00 packs/day for 15 years    Quit date: 10/23/2010  . Smokeless tobacco: Not on file  . Alcohol Use: No  . Drug Use: No  . Sexually Active: Not on file   Other Topics Concern  . Not on file   Social History Narrative    George Barber is married and lives with his wife.  They have     1 child together.  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  He quit alcohol 1 month ago, after a 20-year history     of what he describes as social alcohol, but does endorse drinking 6-8  beers per day.           BP 131/89  Pulse 51  Ht 5\' 10"  (1.778 m)  Wt 150 lb 9.6 oz (68.312 kg)  BMI 21.61 kg/m2  Physical Exam:  Well appearing middle-aged man, NAD HEENT: Unremarkable Neck:  No JVD, no thyromegally Lungs:  Clear with no wheezes, rales, or rhonchi. HEART:  Regular rate rhythm, no murmurs, no rubs, no clicks Abd:  soft, positive bowel sounds, no organomegally, no rebound, no guarding Ext:  2 plus pulses, no edema, no cyanosis, no clubbing Skin:  No rashes no nodules Neuro:  CN II through XII intact, motor grossly intact  DEVICE  Normal device function.  See PaceArt for details.   Assess/Plan:

## 2012-10-17 NOTE — Assessment & Plan Note (Signed)
His St. Jude ICD is working normally. We'll plan to recheck in several months. 

## 2012-10-17 NOTE — Assessment & Plan Note (Signed)
The patient is continued on amiodarone therapy. I would anticipate weaning him off of amiodarone when he comes back for followup in a year. This would of course be pending no sustained ventricular arrhythmias.

## 2012-10-17 NOTE — Patient Instructions (Signed)
Remote monitoring is used to monitor your Pacemaker of ICD from home. This monitoring reduces the number of office visits required to check your device to one time per year. It allows us to keep an eye on the functioning of your device to ensure it is working properly. You are scheduled for a device check from home on 01/20/13. You may send your transmission at any time that day. If you have a wireless device, the transmission will be sent automatically. After your physician reviews your transmission, you will receive a postcard with your next transmission date.  Your physician wants you to follow-up in: 1 year with Dr. Taylor. You will receive a reminder letter in the mail two months in advance. If you don't receive a letter, please call our office to schedule the follow-up appointment.  Your physician recommends that you continue on your current medications as directed. Please refer to the Current Medication list given to you today.   

## 2012-12-17 ENCOUNTER — Other Ambulatory Visit: Payer: Self-pay | Admitting: Internal Medicine

## 2013-01-05 ENCOUNTER — Other Ambulatory Visit: Payer: Self-pay | Admitting: Internal Medicine

## 2013-01-10 ENCOUNTER — Other Ambulatory Visit: Payer: Self-pay | Admitting: Cardiology

## 2013-01-16 ENCOUNTER — Other Ambulatory Visit: Payer: Self-pay | Admitting: Internal Medicine

## 2013-01-17 ENCOUNTER — Other Ambulatory Visit: Payer: Self-pay

## 2013-01-17 MED ORDER — FUROSEMIDE 20 MG PO TABS: 20.0000 mg | ORAL_TABLET | Freq: Every day | ORAL | Status: DC

## 2013-01-20 ENCOUNTER — Encounter: Payer: Self-pay | Admitting: Internal Medicine

## 2013-01-20 ENCOUNTER — Ambulatory Visit (INDEPENDENT_AMBULATORY_CARE_PROVIDER_SITE_OTHER): Payer: BC Managed Care – PPO | Admitting: *Deleted

## 2013-01-20 DIAGNOSIS — I429 Cardiomyopathy, unspecified: Secondary | ICD-10-CM

## 2013-01-20 DIAGNOSIS — Z95810 Presence of automatic (implantable) cardiac defibrillator: Secondary | ICD-10-CM

## 2013-01-20 DIAGNOSIS — I5022 Chronic systolic (congestive) heart failure: Secondary | ICD-10-CM

## 2013-01-20 IMAGING — CR DG CHEST 1V PORT
1 series · 1 of 1 positions shown · non-contrast
Comparison: None

CLINICAL DATA: Shortness of breath

PORTABLE CHEST - 1 VIEW

[AP]
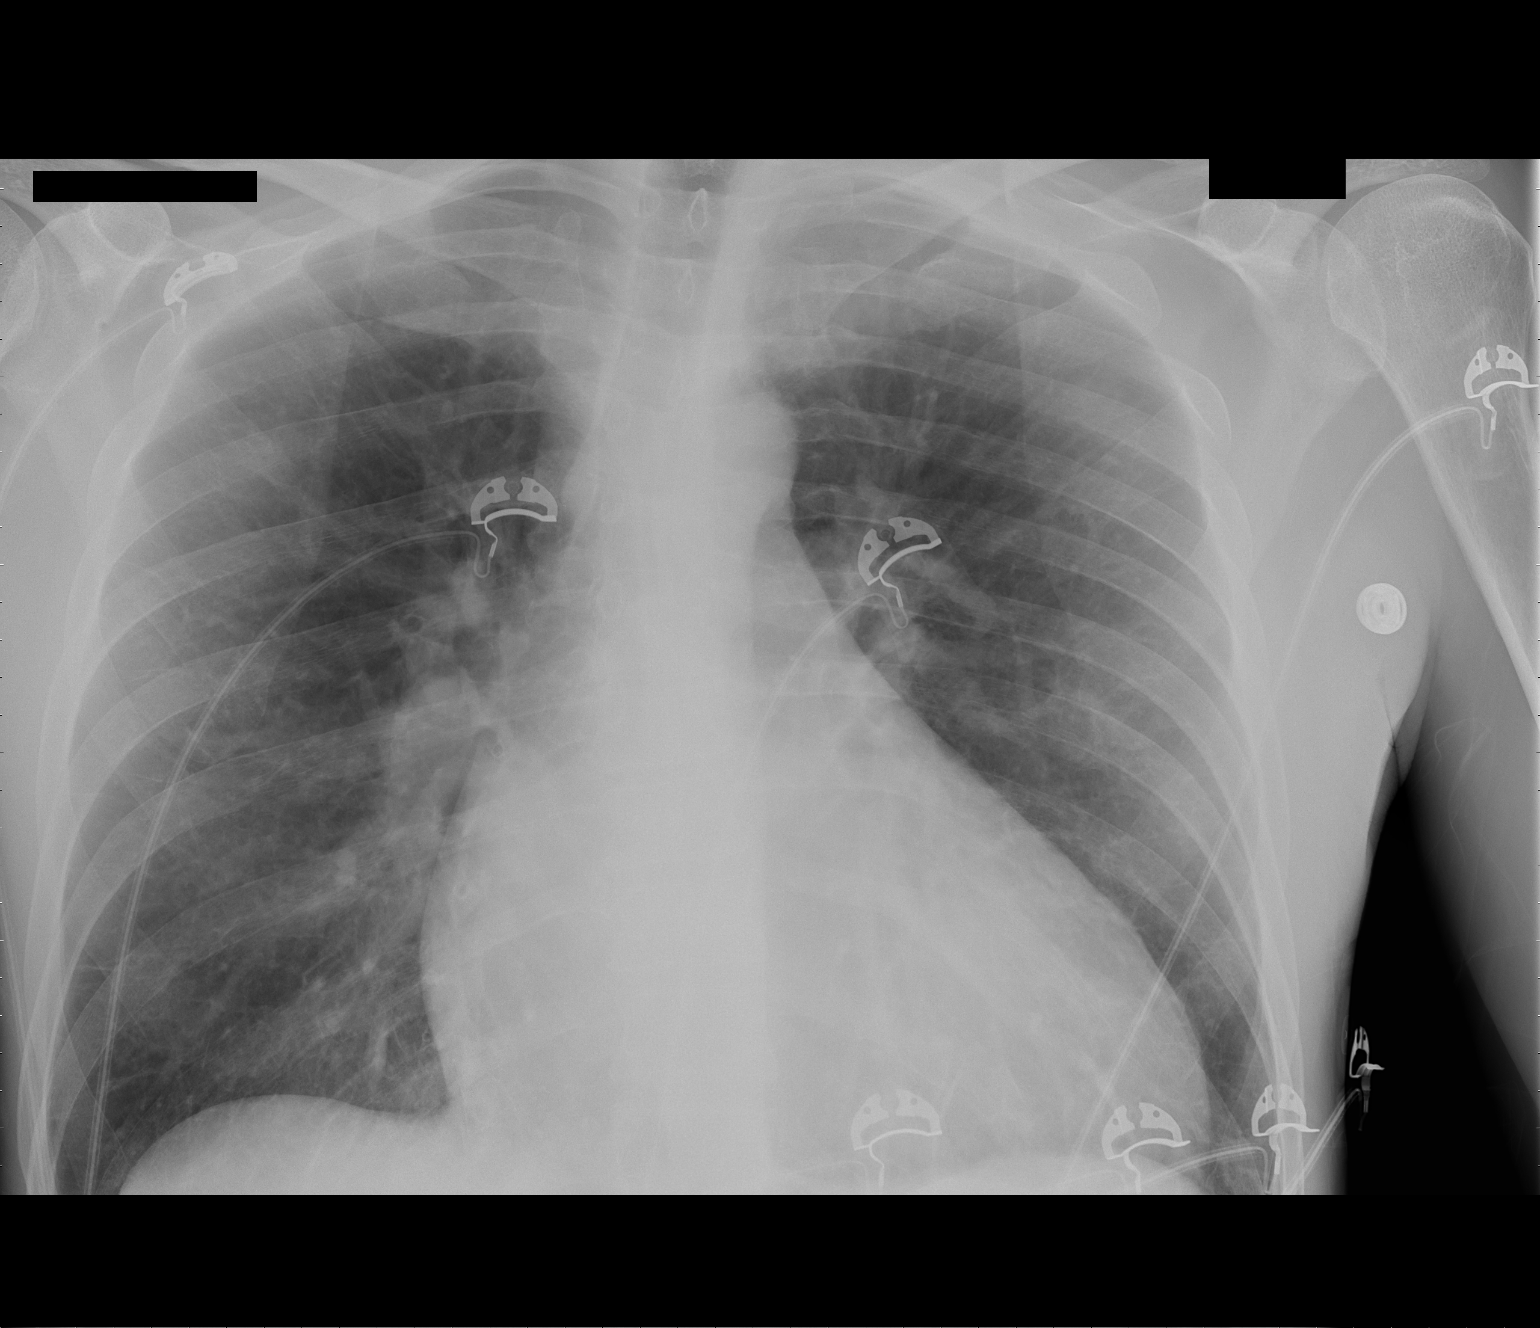

[1 of 1 positions shown; findings below may reference images not displayed]

FINDINGS: The heart size appears enlarged.

There is no pleural effusion or pulmonary edema.

No airspace consolidation.  1.4 cm nodular density is noted in the
projection of the right upper lobe, indeterminate.
IMPRESSION: 1.  No acute cardiopulmonary abnormalities.
2.  Indeterminate nodule in the right upper lobe.  Suggest a more
definitive evaluation with CT chest.

## 2013-01-20 IMAGING — CT CT ANGIO CHEST
2 of 5 series · 19 of 36 positions shown · IV contrast (APPLIED)
Comparison: Chest radiograph 12/09/2010

CLINICAL DATA: Shortness of breath with new onset premature
ventricular contractions.

CT ANGIOGRAPHY CHEST WITH CONTRAST
TECHNIQUE: Multidetector CT imaging of the chest was performed
using the standard protocol during bolus administration of
intravenous contrast.  Multiplanar CT image reconstructions
including MIPs were obtained to evaluate the vascular anatomy.
Contrast:  93 ml 0mnipaque-LCC

[Series 8: pulm embolism 1.0 b25f thins · axial · 0.67mm/px · z∈[+834,+1110]mm · 16 of 310 slices shown]
[im 17/310  lung]
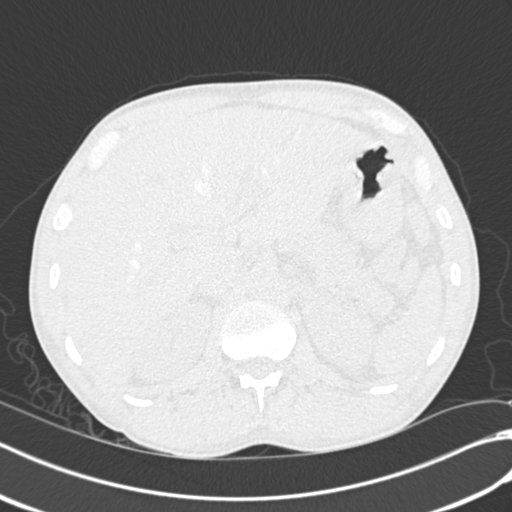
[im 33/310  mediastinal]
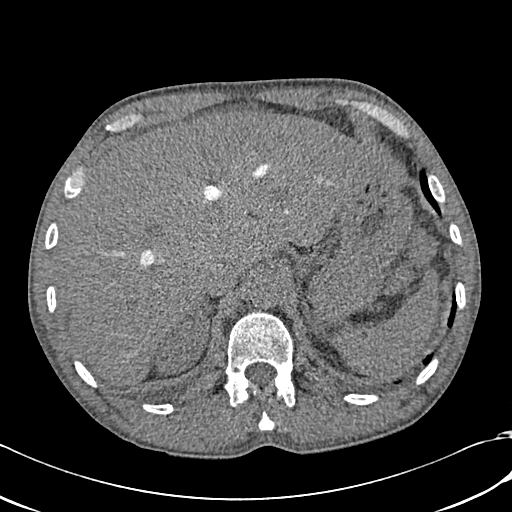
[im 49/310  lung]
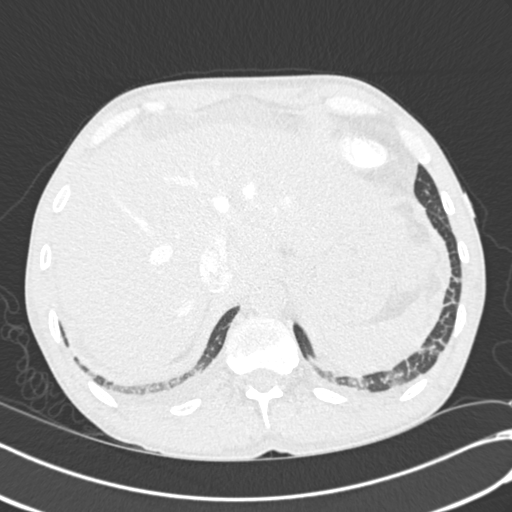
[im 66/310  mediastinal]
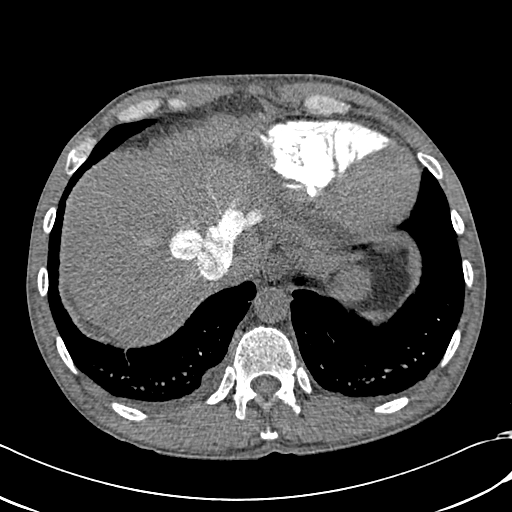
[im 98/310  lung]
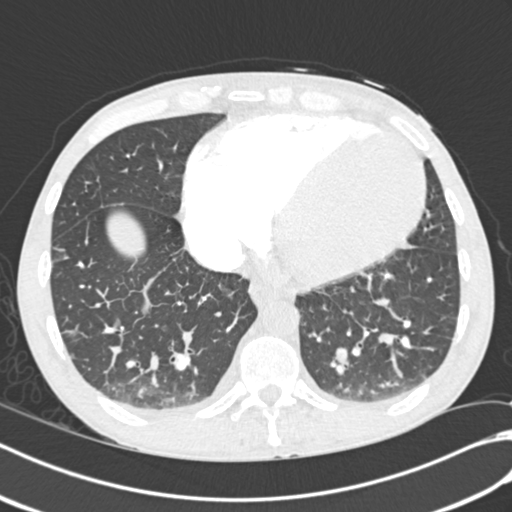
[im 114/310  mediastinal]
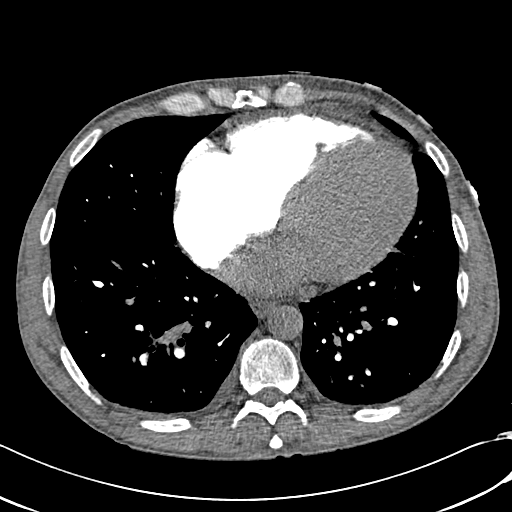
[im 131/310  lung]
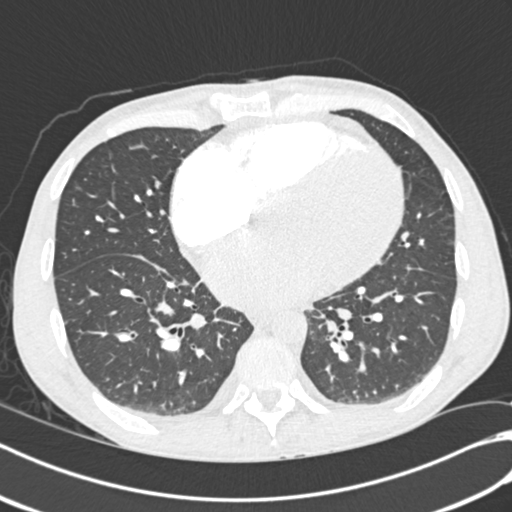
[im 147/310  mediastinal]
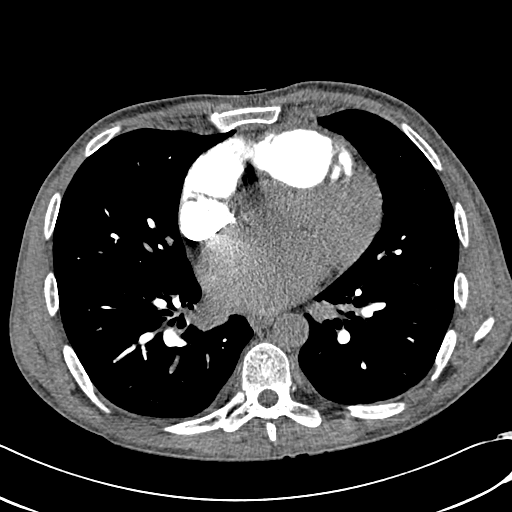
[im 163/310  lung]
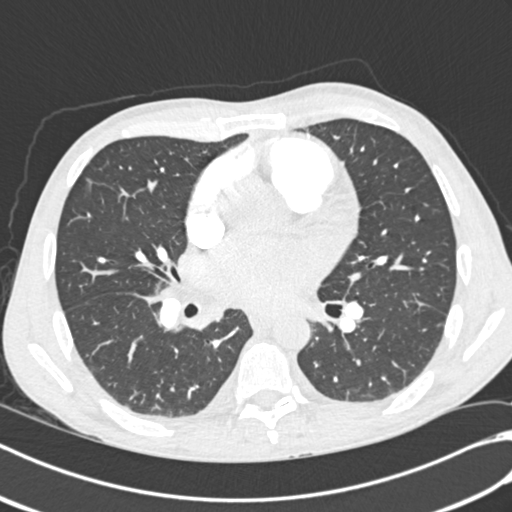
[im 179/310  mediastinal]
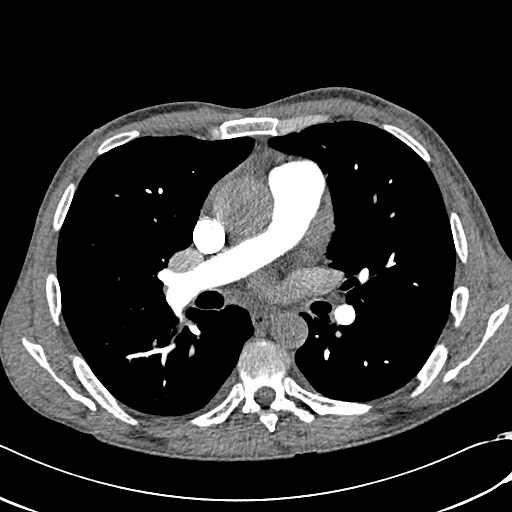
[im 196/310  lung]
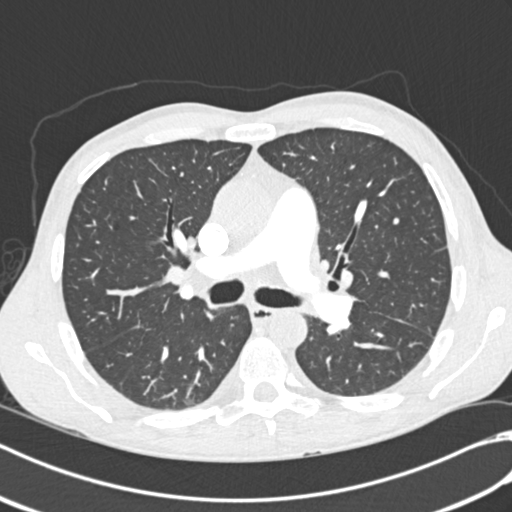
[im 212/310  mediastinal]
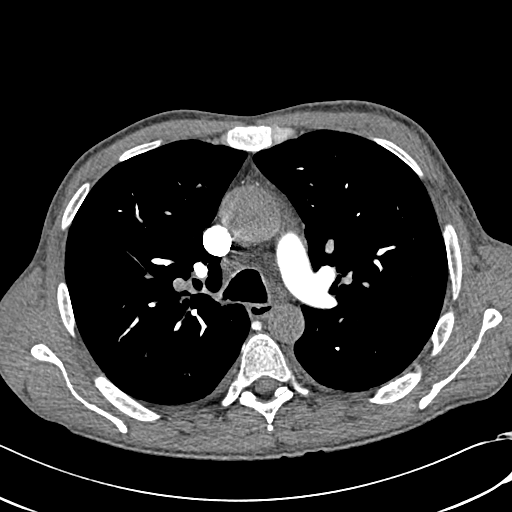
[im 244/310  lung]
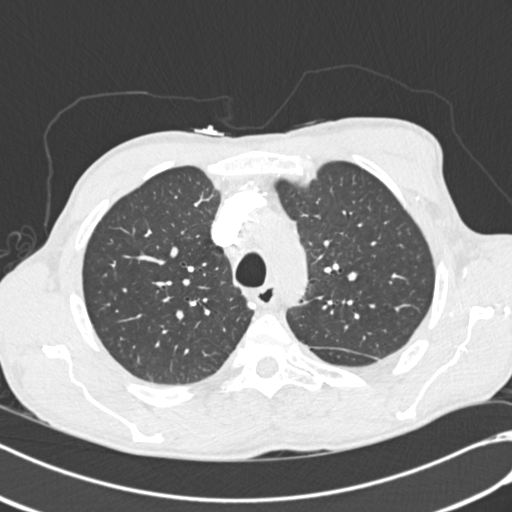
[im 261/310  mediastinal]
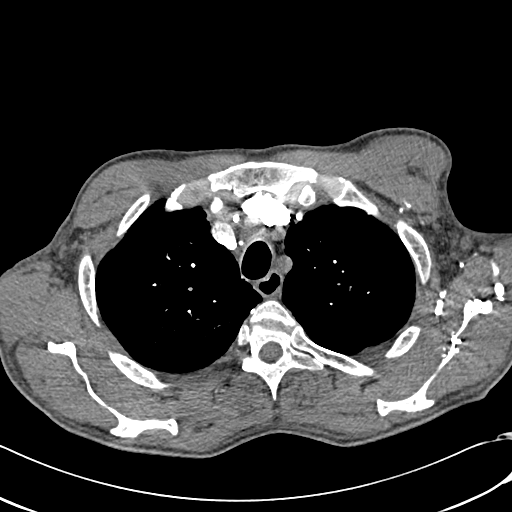
[im 277/310  lung]
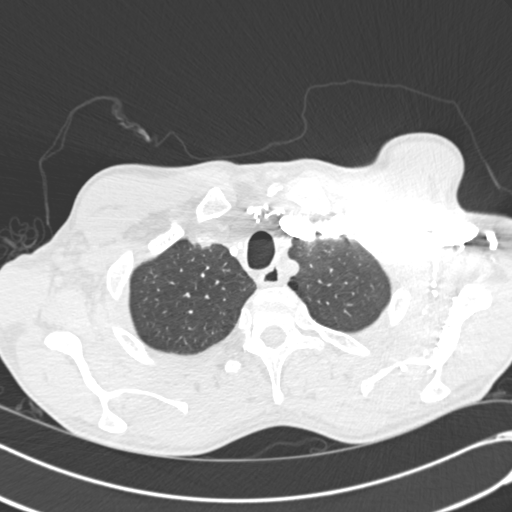
[im 293/310  mediastinal]
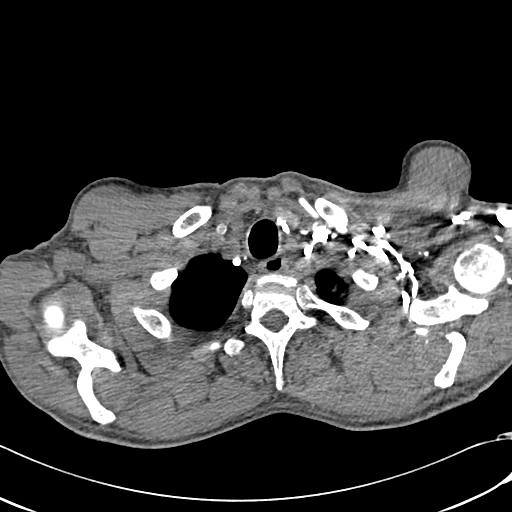

[Series 9: pulm embolism 2.0 spo thins · coronal · 0.68mm/px · 3 of 114 slices shown]
[im 23/114  mediastinal]
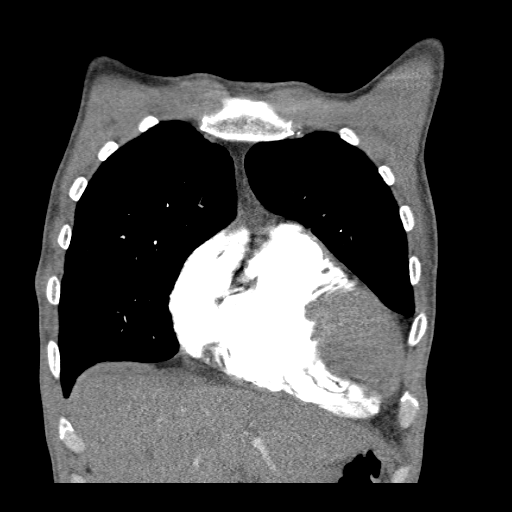
[im 46/114  mediastinal]
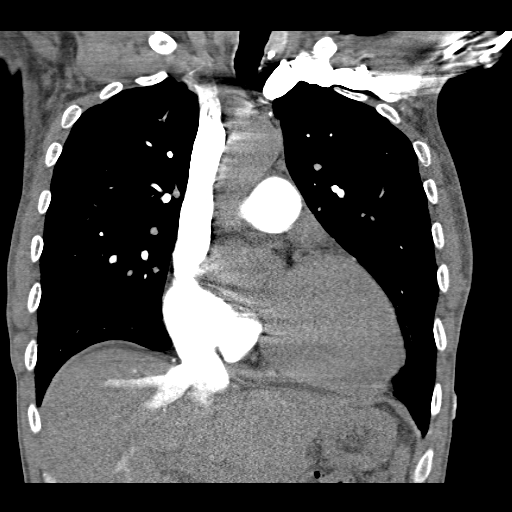
[im 68/114  mediastinal]
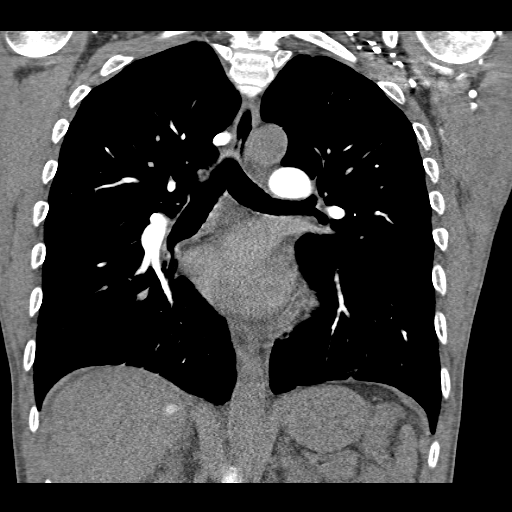

[19 of 36 positions shown; findings below may reference images not displayed]

FINDINGS: No pulmonary embolus.  Low right paratracheal lymph node
measures 9 mm.  There may be a prominent pericardial recess in the
region of the AP window, simulating a lymph node. No hilar or
axillary adenopathy.  Collateral venous flow is seen in the
mediastinum and left supraclavicular region.  Ascending aorta
measures 3.9 cm.  Pulmonary arteries are enlarged.  Heart is mildly
enlarged.  Reflux of contrast is seen into the hepatic veins.  No
pericardial effusion.

Tiny bilateral pleural effusions.  Septal thickening and ground-
glass are seen in the lung bases.  Dependent atelectasis
bilaterally.  Lungs are otherwise clear.  Airway is unremarkable.

Incidental imaging of the upper abdomen shows no acute findings.
No worrisome lytic or sclerotic lesions.

Review of the MIP images confirms the above findings.
IMPRESSION: 1.  No pulmonary embolus.
2.  Mild congestive heart failure.
3.  Pulmonary arterial hypertension.

## 2013-01-22 LAB — REMOTE ICD DEVICE
DEV-0020ICD: NEGATIVE
HV IMPEDENCE: 74 Ohm
RV LEAD IMPEDENCE ICD: 380 Ohm

## 2013-01-28 ENCOUNTER — Encounter: Payer: Self-pay | Admitting: *Deleted

## 2013-02-05 ENCOUNTER — Other Ambulatory Visit: Payer: Self-pay | Admitting: Internal Medicine

## 2013-04-06 ENCOUNTER — Other Ambulatory Visit: Payer: Self-pay | Admitting: Internal Medicine

## 2013-04-28 ENCOUNTER — Ambulatory Visit (INDEPENDENT_AMBULATORY_CARE_PROVIDER_SITE_OTHER): Payer: BC Managed Care – PPO | Admitting: *Deleted

## 2013-04-28 DIAGNOSIS — Z95810 Presence of automatic (implantable) cardiac defibrillator: Secondary | ICD-10-CM

## 2013-04-28 DIAGNOSIS — I429 Cardiomyopathy, unspecified: Secondary | ICD-10-CM

## 2013-04-28 DIAGNOSIS — I5022 Chronic systolic (congestive) heart failure: Secondary | ICD-10-CM

## 2013-04-29 LAB — REMOTE ICD DEVICE
BRDY-0002RV: 40 {beats}/min
DEV-0020ICD: NEGATIVE
DEVICE MODEL ICD: 1005776
HV IMPEDENCE: 65 Ohm
RV LEAD AMPLITUDE: 11.8 mv
RV LEAD IMPEDENCE ICD: 340 Ohm
VENTRICULAR PACING ICD: 1 pct

## 2013-05-06 ENCOUNTER — Other Ambulatory Visit: Payer: Self-pay

## 2013-05-06 ENCOUNTER — Encounter: Payer: Self-pay | Admitting: *Deleted

## 2013-05-06 MED ORDER — LOSARTAN POTASSIUM 50 MG PO TABS: 50.0000 mg | ORAL_TABLET | Freq: Every day | ORAL | Status: DC

## 2013-05-22 ENCOUNTER — Encounter: Payer: Self-pay | Admitting: Internal Medicine

## 2013-07-20 ENCOUNTER — Other Ambulatory Visit: Payer: Self-pay | Admitting: Internal Medicine

## 2013-07-31 ENCOUNTER — Ambulatory Visit (INDEPENDENT_AMBULATORY_CARE_PROVIDER_SITE_OTHER): Payer: BC Managed Care – PPO | Admitting: *Deleted

## 2013-07-31 ENCOUNTER — Encounter: Payer: Self-pay | Admitting: Internal Medicine

## 2013-07-31 DIAGNOSIS — I5022 Chronic systolic (congestive) heart failure: Secondary | ICD-10-CM

## 2013-07-31 DIAGNOSIS — I4729 Other ventricular tachycardia: Secondary | ICD-10-CM

## 2013-07-31 DIAGNOSIS — I429 Cardiomyopathy, unspecified: Secondary | ICD-10-CM

## 2013-07-31 DIAGNOSIS — I472 Ventricular tachycardia, unspecified: Secondary | ICD-10-CM

## 2013-07-31 LAB — MDC_IDC_ENUM_SESS_TYPE_REMOTE
Battery Remaining Percentage: 79 %
Battery Voltage: 2.99 V
Brady Statistic RV Percent Paced: 1 %
HIGH POWER IMPEDANCE MEASURED VALUE: 65 Ohm
HighPow Impedance: 65 Ohm
Lead Channel Impedance Value: 350 Ohm
Lead Channel Pacing Threshold Amplitude: 1 V
Lead Channel Pacing Threshold Pulse Width: 0.5 ms
Lead Channel Setting Pacing Pulse Width: 0.5 ms
Lead Channel Setting Sensing Sensitivity: 0.5 mV
MDC IDC MSMT BATTERY REMAINING LONGEVITY: 84 mo
MDC IDC MSMT LEADCHNL RV SENSING INTR AMPL: 11.8 mV
MDC IDC PG SERIAL: 1005776
MDC IDC SESS DTM: 20150108070027
MDC IDC SET LEADCHNL RV PACING AMPLITUDE: 2.5 V
MDC IDC SET ZONE DETECTION INTERVAL: 310 ms

## 2013-08-07 ENCOUNTER — Other Ambulatory Visit: Payer: Self-pay | Admitting: Internal Medicine

## 2013-08-07 ENCOUNTER — Other Ambulatory Visit: Payer: Self-pay | Admitting: Cardiology

## 2013-08-19 ENCOUNTER — Encounter: Payer: Self-pay | Admitting: *Deleted

## 2013-09-01 ENCOUNTER — Ambulatory Visit (INDEPENDENT_AMBULATORY_CARE_PROVIDER_SITE_OTHER): Payer: BC Managed Care – PPO | Admitting: *Deleted

## 2013-09-01 DIAGNOSIS — Z95810 Presence of automatic (implantable) cardiac defibrillator: Secondary | ICD-10-CM

## 2013-09-01 LAB — MDC_IDC_ENUM_SESS_TYPE_INCLINIC
Battery Remaining Longevity: 86.4 mo
Brady Statistic RV Percent Paced: 0 %
Date Time Interrogation Session: 20150209123427
HIGH POWER IMPEDANCE MEASURED VALUE: 79.875
Lead Channel Impedance Value: 375 Ohm
Lead Channel Pacing Threshold Amplitude: 1.25 V
MDC IDC MSMT LEADCHNL RV PACING THRESHOLD AMPLITUDE: 1.25 V
MDC IDC MSMT LEADCHNL RV PACING THRESHOLD PULSEWIDTH: 0.5 ms
MDC IDC MSMT LEADCHNL RV PACING THRESHOLD PULSEWIDTH: 0.5 ms
MDC IDC MSMT LEADCHNL RV SENSING INTR AMPL: 11.8 mV
MDC IDC PG SERIAL: 1005776
MDC IDC SET LEADCHNL RV PACING AMPLITUDE: 2.5 V
MDC IDC SET LEADCHNL RV PACING PULSEWIDTH: 0.5 ms
MDC IDC SET LEADCHNL RV SENSING SENSITIVITY: 0.5 mV
Zone Setting Detection Interval: 310 ms

## 2013-09-01 NOTE — Progress Notes (Signed)
Mode reversion due to [employer] 8.0V software update. All original program settings returned by [employer].   ROV w/ Dr. Graciela Husbands 10/21/13 @ 10:00.

## 2013-09-06 ENCOUNTER — Other Ambulatory Visit: Payer: Self-pay | Admitting: Cardiology

## 2013-09-24 ENCOUNTER — Encounter: Payer: Self-pay | Admitting: Internal Medicine

## 2013-10-07 ENCOUNTER — Other Ambulatory Visit: Payer: Self-pay | Admitting: Internal Medicine

## 2013-10-21 ENCOUNTER — Encounter: Payer: Self-pay | Admitting: Internal Medicine

## 2013-10-21 ENCOUNTER — Ambulatory Visit (INDEPENDENT_AMBULATORY_CARE_PROVIDER_SITE_OTHER): Payer: BC Managed Care – PPO | Admitting: Internal Medicine

## 2013-10-21 VITALS — BP 144/80 | HR 62 | Ht 69.0 in | Wt 149.0 lb

## 2013-10-21 DIAGNOSIS — I5022 Chronic systolic (congestive) heart failure: Secondary | ICD-10-CM

## 2013-10-21 DIAGNOSIS — Z95810 Presence of automatic (implantable) cardiac defibrillator: Secondary | ICD-10-CM

## 2013-10-21 DIAGNOSIS — I429 Cardiomyopathy, unspecified: Secondary | ICD-10-CM

## 2013-10-21 LAB — MDC_IDC_ENUM_SESS_TYPE_INCLINIC
Battery Remaining Longevity: 84 mo
Brady Statistic RV Percent Paced: 0.01 %
HighPow Impedance: 67.5 Ohm
Implantable Pulse Generator Serial Number: 1005776
Lead Channel Pacing Threshold Amplitude: 0.75 V
Lead Channel Pacing Threshold Pulse Width: 0.5 ms
Lead Channel Sensing Intrinsic Amplitude: 11.8 mV
Lead Channel Setting Pacing Amplitude: 2.5 V
Lead Channel Setting Pacing Pulse Width: 0.5 ms
Lead Channel Setting Sensing Sensitivity: 0.5 mV
MDC IDC MSMT LEADCHNL RV IMPEDANCE VALUE: 362.5 Ohm
MDC IDC SESS DTM: 20150331100239
MDC IDC SET ZONE DETECTION INTERVAL: 310 ms

## 2013-10-21 NOTE — Patient Instructions (Signed)
Your physician wants you to follow-up in: 12 months with Dr Court Joy will receive a reminder letter in the mail two months in advance. If you don't receive a letter, please call our office to schedule the follow-up appointment.    Remote monitoring is used to monitor your Pacemaker or ICD from home. This monitoring reduces the number of office visits required to check your device to one time per year. It allows [place] to keep an eye on the functioning of your device to ensure it is working properly. You are scheduled for a device check from home on 01/22/14. You may send your transmission at any time that day. If you have a wireless device, the transmission will be sent automatically. After your physician reviews your transmission, you will receive a postcard with your next transmission date.

## 2013-10-21 NOTE — Assessment & Plan Note (Signed)
His symptoms are class 2A. I have asked the patient to continue his current meds and to reduce his sodium intake.

## 2013-10-21 NOTE — Assessment & Plan Note (Signed)
His St. Jude ICD is working normally. Will recheck in several months.  

## 2013-10-21 NOTE — Progress Notes (Signed)
HPI [name] returns today for followup. He is a very pleasant 53 year old man with a history of ventricular tachycardia, status post ICD insertion. He has a nonischemic cardiomyopathy with chronic systolic heart failure currently class II. In the interim, he has done well. He denies chest pain, shortness of breath, or syncope. No peripheral edema. No ICD shocks. No Known Allergies   Current Outpatient Prescriptions  Medication Sig Dispense Refill  . amiodarone (PACERONE) 200 MG tablet TAKE ONE TABLET BY MOUTH ONCE DAILY  30 tablet  0  . aspirin 81 MG tablet Take 81 mg by mouth daily.        . carvedilol (COREG) 12.5 MG tablet TAKE ONE TABLET BY MOUTH TWICE DAILY WITH A MEAL  60 tablet  1  . furosemide (LASIX) 20 MG tablet TAKE ONE TABLET BY MOUTH ONCE DAILY  30 tablet  9  . losartan (COZAAR) 50 MG tablet Take 1 tablet (50 mg total) by mouth daily.  30 tablet  12  . spironolactone (ALDACTONE) 25 MG tablet TAKE ONE TABLET BY MOUTH ONCE DAILY  30 tablet  2   No current facility-administered medications for this visit.     Past Medical History  Diagnosis Date  . NICM (nonischemic cardiomyopathy)     cath 12/13/10: Normal cors, EF 10-15%;  b. echo 5/12 EF 15%, mild MR, mod LAE, mild RVE, mild to mod RAE, mild to mod TR, PASP 44  . Systolic CHF, chronic   . NSVT (nonsustained ventricular tachycardia)     Life Vest; amiodarone rx  . Emphysema   . Fluttering heart   . Elevated LFTs     ROS:   All systems reviewed and negative except as noted in the HPI.   Past Surgical History  Procedure Laterality Date  . Cardiac catheterization    . 2d echocardiogram  02/2011     Family History  Problem Relation Age of Onset  . Cancer Mother 65  . Coronary artery disease Father     unknown  . Emphysema Father      History   Social History  . Marital Status: Married    Spouse Name: N/A    Number of Children: 1  . Years of Education: N/A   Occupational History  . ELECTRICAL     Social History Main Topics  . Smoking status: Former Smoker -- 1.00 packs/day for 15 years    Quit date: 10/23/2010  . Smokeless tobacco: Not on file  . Alcohol Use: No  . Drug Use: No  . Sexual Activity: Not on file   Other Topics Concern  . Not on file   Social History Narrative    George Barber is married and lives with his wife.  They have     1 child together.  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  He quit alcohol 1 month ago, after a 20-year history     of what he describes as social alcohol, but does endorse drinking 6-8     beers per day.           BP 144/80  Pulse 62  Ht 5\' 9"  (1.753 m)  Wt 149 lb (67.586 kg)  BMI 21.99 kg/m2  Physical Exam:  Well appearing middle-aged man, NAD HEENT: Unremarkable Neck:  No JVD, no thyromegally Lungs:  Clear with no wheezes, rales, or rhonchi. HEART:  Regular rate rhythm, no murmurs, no rubs, no clicks  Abd:  soft, positive bowel sounds, no organomegally, no rebound, no guarding Ext:  2 plus pulses, no edema, no cyanosis, no clubbing Skin:  No rashes no nodules Neuro:  CN II through XII intact, motor grossly intact  DEVICE  Normal device function.  See PaceArt for details.   Assess/Plan:

## 2013-10-24 ENCOUNTER — Other Ambulatory Visit: Payer: Self-pay | Admitting: Internal Medicine

## 2013-10-27 ENCOUNTER — Encounter: Payer: Self-pay | Admitting: Internal Medicine

## 2013-11-07 ENCOUNTER — Other Ambulatory Visit: Payer: Self-pay | Admitting: Internal Medicine

## 2013-11-18 ENCOUNTER — Other Ambulatory Visit: Payer: Self-pay | Admitting: Cardiology

## 2013-12-22 ENCOUNTER — Other Ambulatory Visit: Payer: Self-pay | Admitting: Cardiology

## 2014-01-22 ENCOUNTER — Encounter: Payer: Self-pay | Admitting: Internal Medicine

## 2014-01-22 ENCOUNTER — Ambulatory Visit (INDEPENDENT_AMBULATORY_CARE_PROVIDER_SITE_OTHER): Payer: BC Managed Care – PPO | Admitting: *Deleted

## 2014-01-22 DIAGNOSIS — I429 Cardiomyopathy, unspecified: Secondary | ICD-10-CM

## 2014-01-22 DIAGNOSIS — I472 Ventricular tachycardia, unspecified: Secondary | ICD-10-CM

## 2014-01-22 DIAGNOSIS — I5022 Chronic systolic (congestive) heart failure: Secondary | ICD-10-CM

## 2014-01-22 DIAGNOSIS — I4729 Other ventricular tachycardia: Secondary | ICD-10-CM

## 2014-01-22 NOTE — Progress Notes (Signed)
Remote ICD transmission.   

## 2014-01-28 ENCOUNTER — Other Ambulatory Visit: Payer: Self-pay | Admitting: Internal Medicine

## 2014-02-02 LAB — MDC_IDC_ENUM_SESS_TYPE_REMOTE
Battery Remaining Longevity: 80 mo
Battery Remaining Percentage: 75 %
Battery Voltage: 2.98 V
Brady Statistic RV Percent Paced: 1 %
Date Time Interrogation Session: 20150702065017
HIGH POWER IMPEDANCE MEASURED VALUE: 82 Ohm
HighPow Impedance: 82 Ohm
Implantable Pulse Generator Serial Number: 1005776
Lead Channel Impedance Value: 360 Ohm
Lead Channel Pacing Threshold Amplitude: 0.75 V
Lead Channel Pacing Threshold Pulse Width: 0.5 ms
Lead Channel Sensing Intrinsic Amplitude: 11.8 mV
Lead Channel Setting Pacing Amplitude: 2.5 V
Lead Channel Setting Sensing Sensitivity: 0.5 mV
MDC IDC SET LEADCHNL RV PACING PULSEWIDTH: 0.5 ms
MDC IDC SET ZONE DETECTION INTERVAL: 310 ms

## 2014-02-09 ENCOUNTER — Encounter: Payer: Self-pay | Admitting: Cardiology

## 2014-04-27 ENCOUNTER — Encounter: Payer: Self-pay | Admitting: Internal Medicine

## 2014-04-27 ENCOUNTER — Telehealth: Payer: Self-pay | Admitting: Cardiology

## 2014-04-27 ENCOUNTER — Ambulatory Visit (INDEPENDENT_AMBULATORY_CARE_PROVIDER_SITE_OTHER): Payer: BC Managed Care – PPO | Admitting: *Deleted

## 2014-04-27 DIAGNOSIS — I429 Cardiomyopathy, unspecified: Secondary | ICD-10-CM

## 2014-04-27 DIAGNOSIS — I5022 Chronic systolic (congestive) heart failure: Secondary | ICD-10-CM

## 2014-04-27 DIAGNOSIS — I4729 Other ventricular tachycardia: Secondary | ICD-10-CM

## 2014-04-27 DIAGNOSIS — I472 Ventricular tachycardia: Secondary | ICD-10-CM

## 2014-04-27 NOTE — Telephone Encounter (Signed)
LMOVM reminding pt to send remote transmission.   

## 2014-04-29 LAB — MDC_IDC_ENUM_SESS_TYPE_REMOTE
Battery Remaining Percentage: 73 %
Battery Voltage: 2.98 V
Date Time Interrogation Session: 20151005204746
HIGH POWER IMPEDANCE MEASURED VALUE: 69 Ohm
HighPow Impedance: 69 Ohm
Lead Channel Pacing Threshold Pulse Width: 0.5 ms
MDC IDC MSMT BATTERY REMAINING LONGEVITY: 77 mo
MDC IDC MSMT LEADCHNL RV IMPEDANCE VALUE: 340 Ohm
MDC IDC MSMT LEADCHNL RV PACING THRESHOLD AMPLITUDE: 0.75 V
MDC IDC MSMT LEADCHNL RV SENSING INTR AMPL: 11.8 mV
MDC IDC PG SERIAL: 1005776
MDC IDC SET LEADCHNL RV PACING AMPLITUDE: 2.5 V
MDC IDC SET LEADCHNL RV PACING PULSEWIDTH: 0.5 ms
MDC IDC SET LEADCHNL RV SENSING SENSITIVITY: 0.5 mV
MDC IDC STAT BRADY RV PERCENT PACED: 1 %
Zone Setting Detection Interval: 310 ms

## 2014-04-29 NOTE — Progress Notes (Signed)
Remote ICD transmission.   

## 2014-05-06 ENCOUNTER — Other Ambulatory Visit: Payer: Self-pay | Admitting: Internal Medicine

## 2014-05-07 ENCOUNTER — Other Ambulatory Visit: Payer: Self-pay

## 2014-05-07 MED ORDER — CARVEDILOL 12.5 MG PO TABS: 12.5000 mg | ORAL_TABLET | Freq: Two times a day (BID) | ORAL | Status: DC

## 2014-05-15 ENCOUNTER — Encounter: Payer: Self-pay | Admitting: *Deleted

## 2014-06-04 ENCOUNTER — Other Ambulatory Visit: Payer: Self-pay | Admitting: Internal Medicine

## 2014-06-09 ENCOUNTER — Other Ambulatory Visit: Payer: Self-pay

## 2014-06-09 MED ORDER — LOSARTAN POTASSIUM 50 MG PO TABS: 50.0000 mg | ORAL_TABLET | Freq: Every day | ORAL | Status: DC

## 2014-07-29 ENCOUNTER — Telehealth: Payer: Self-pay | Admitting: Cardiology

## 2014-07-29 ENCOUNTER — Ambulatory Visit (INDEPENDENT_AMBULATORY_CARE_PROVIDER_SITE_OTHER): Payer: BLUE CROSS/BLUE SHIELD | Admitting: *Deleted

## 2014-07-29 DIAGNOSIS — I429 Cardiomyopathy, unspecified: Secondary | ICD-10-CM

## 2014-07-29 DIAGNOSIS — I472 Ventricular tachycardia: Secondary | ICD-10-CM

## 2014-07-29 DIAGNOSIS — I5022 Chronic systolic (congestive) heart failure: Secondary | ICD-10-CM

## 2014-07-29 DIAGNOSIS — I4729 Other ventricular tachycardia: Secondary | ICD-10-CM

## 2014-07-29 NOTE — Progress Notes (Signed)
Remote ICD transmission.   

## 2014-07-29 NOTE — Telephone Encounter (Signed)
LMOVM reminding pt to send remote transmission.   

## 2014-07-31 LAB — MDC_IDC_ENUM_SESS_TYPE_REMOTE
Brady Statistic RV Percent Paced: 1 % — CL
HighPow Impedance: 68 Ohm
Lead Channel Setting Sensing Sensitivity: 0.5 mV
MDC IDC MSMT BATTERY REMAINING PERCENTAGE: 71 %
MDC IDC MSMT LEADCHNL RV IMPEDANCE VALUE: 340 Ohm
MDC IDC MSMT LEADCHNL RV SENSING INTR AMPL: 11.8 mV
MDC IDC PG SERIAL: 1005776
MDC IDC SET LEADCHNL RV PACING AMPLITUDE: 2.5 V
MDC IDC SET LEADCHNL RV PACING PULSEWIDTH: 0.5 ms
MDC IDC SET ZONE DETECTION INTERVAL: 310 ms

## 2014-08-10 ENCOUNTER — Encounter: Payer: Self-pay | Admitting: Cardiology

## 2014-08-24 ENCOUNTER — Encounter: Payer: Self-pay | Admitting: Internal Medicine

## 2014-09-17 ENCOUNTER — Other Ambulatory Visit: Payer: Self-pay | Admitting: Internal Medicine

## 2014-10-06 ENCOUNTER — Other Ambulatory Visit: Payer: Self-pay | Admitting: Internal Medicine

## 2014-10-27 ENCOUNTER — Ambulatory Visit (INDEPENDENT_AMBULATORY_CARE_PROVIDER_SITE_OTHER): Payer: BLUE CROSS/BLUE SHIELD | Admitting: Internal Medicine

## 2014-10-27 ENCOUNTER — Encounter: Payer: Self-pay | Admitting: Internal Medicine

## 2014-10-27 VITALS — BP 124/82 | HR 57 | Ht 69.5 in | Wt 148.8 lb

## 2014-10-27 DIAGNOSIS — I5022 Chronic systolic (congestive) heart failure: Secondary | ICD-10-CM | POA: Diagnosis not present

## 2014-10-27 DIAGNOSIS — I4729 Other ventricular tachycardia: Secondary | ICD-10-CM

## 2014-10-27 DIAGNOSIS — I429 Cardiomyopathy, unspecified: Secondary | ICD-10-CM

## 2014-10-27 DIAGNOSIS — Z95810 Presence of automatic (implantable) cardiac defibrillator: Secondary | ICD-10-CM | POA: Diagnosis not present

## 2014-10-27 DIAGNOSIS — I472 Ventricular tachycardia: Secondary | ICD-10-CM | POA: Diagnosis not present

## 2014-10-27 LAB — MDC_IDC_ENUM_SESS_TYPE_INCLINIC
Battery Remaining Longevity: 75.6 mo
Brady Statistic RV Percent Paced: 0.01 %
HighPow Impedance: 65.25 Ohm
Lead Channel Setting Pacing Amplitude: 2.5 V
MDC IDC MSMT LEADCHNL RV IMPEDANCE VALUE: 325 Ohm
MDC IDC MSMT LEADCHNL RV PACING THRESHOLD AMPLITUDE: 1.25 V
MDC IDC MSMT LEADCHNL RV PACING THRESHOLD AMPLITUDE: 1.25 V
MDC IDC MSMT LEADCHNL RV PACING THRESHOLD PULSEWIDTH: 0.5 ms
MDC IDC MSMT LEADCHNL RV PACING THRESHOLD PULSEWIDTH: 0.5 ms
MDC IDC MSMT LEADCHNL RV SENSING INTR AMPL: 11.8 mV
MDC IDC PG SERIAL: 1005776
MDC IDC SESS DTM: 20160405200946
MDC IDC SET LEADCHNL RV PACING PULSEWIDTH: 0.5 ms
MDC IDC SET LEADCHNL RV SENSING SENSITIVITY: 0.5 mV
Zone Setting Detection Interval: 310 ms

## 2014-10-27 NOTE — Assessment & Plan Note (Signed)
His symptoms are currently class IIb. He will continue his current medications and maintain a low-sodium diet.

## 2014-10-27 NOTE — Assessment & Plan Note (Signed)
His [employer] Jude single chamber defibrillator is working normally. We'll plan to recheck in several months. 

## 2014-10-27 NOTE — Patient Instructions (Signed)
Your physician recommends that you continue on your current medications as directed. Please refer to the Current Medication list given to you today. Your physician wants you to follow-up in: 1 YEAR WITH DR TAYLOR.   You will receive a reminder letter in the mail two months in advance. If you don't receive a letter, please call our office to schedule the follow-up appointment.  

## 2014-10-27 NOTE — Assessment & Plan Note (Signed)
He will remain on amiodarone therapy. He continues to have nonsustained ventricular tachycardia, but his had no sustained ventricular tachycardia.

## 2014-10-27 NOTE — Progress Notes (Signed)
HPI George Barber returns today for followup. He is a very pleasant 54 year old man with a history of ventricular tachycardia, status post ICD insertion. He has a nonischemic cardiomyopathy with chronic systolic heart failure currently class II. In the interim, he has been stable, but has had trouble working, as he is on a scaffolding, and notes severe dizziness, when he gets up quickly or bends over. He denies chest pain, shortness of breath, or syncope. No peripheral edema. No ICD shocks.  No Known Allergies   Current Outpatient Prescriptions  Medication Sig Dispense Refill  . aspirin 81 MG tablet Take 81 mg by mouth daily.      . carvedilol (COREG) 12.5 MG tablet TAKE ONE TABLET BY MOUTH TWICE DAILY WITH MEALS 60 tablet 3  . furosemide (LASIX) 20 MG tablet TAKE ONE TABLET BY MOUTH ONCE DAILY 30 tablet 0  . losartan (COZAAR) 50 MG tablet Take 1 tablet (50 mg total) by mouth daily. 30 tablet 6  . PACERONE 200 MG tablet TAKE ONE TABLET BY MOUTH ONCE DAILY 30 tablet 6  . spironolactone (ALDACTONE) 25 MG tablet TAKE ONE TABLET BY MOUTH ONCE DAILY 30 tablet 11   No current facility-administered medications for this visit.     Past Medical History  Diagnosis Date  . NICM (nonischemic cardiomyopathy)     cath 12/13/10: Normal cors, EF 10-15%;  b. echo 5/12 EF 15%, mild MR, mod LAE, mild RVE, mild to mod RAE, mild to mod TR, PASP 44  . Systolic CHF, chronic   . NSVT (nonsustained ventricular tachycardia)     Life Vest; amiodarone rx  . Emphysema   . Fluttering heart   . Elevated LFTs     ROS:   All systems reviewed and negative except as noted in the HPI.   Past Surgical History  Procedure Laterality Date  . Cardiac catheterization    . 2d echocardiogram  02/2011     Family History  Problem Relation Age of Onset  . Cancer Mother 29  . Coronary artery disease Father     unknown  . Emphysema Father      History   Social History  . Marital Status: Married    Spouse Name: N/A   . Number of Children: 1  . Years of Education: N/A   Occupational History  . ELECTRICAL    Social History Main Topics  . Smoking status: Former Smoker -- 1.00 packs/day for 15 years    Quit date: 10/23/2010  . Smokeless tobacco: Not on file  . Alcohol Use: No  . Drug Use: No  . Sexual Activity: Not on file   Other Topics Concern  . Not on file   Social History Narrative    George Barber is married and lives with his wife.  They have     1 child together.  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  He quit alcohol 1 month ago, after a 20-year history     of what he describes as social alcohol, but does endorse drinking 6-8     beers per day.           BP 124/82 mmHg  Pulse 57  Ht 5' 9.5" (1.765 m)  Wt 148 lb 12.8 oz (67.495 kg)  BMI 21.67 kg/m2  Physical Exam:  Stable appearing middle-aged man, NAD HEENT: Unremarkable Neck:  No JVD, no thyromegally Lungs:  Clear with no wheezes, rales, or  rhonchi. HEART:  Regular rate rhythm, no murmurs, no rubs, no clicks Abd:  soft, positive bowel sounds, no organomegally, no rebound, no guarding Ext:  2 plus pulses, no edema, no cyanosis, no clubbing Skin:  No rashes no nodules Neuro:  CN II through XII intact, motor grossly intact  DEVICE  Normal device function.  See PaceArt for details.   Assess/Plan:

## 2014-11-05 ENCOUNTER — Other Ambulatory Visit: Payer: Self-pay | Admitting: Internal Medicine

## 2014-11-12 ENCOUNTER — Encounter: Payer: Self-pay | Admitting: Internal Medicine

## 2014-12-07 ENCOUNTER — Other Ambulatory Visit: Payer: Self-pay | Admitting: Internal Medicine

## 2015-01-07 ENCOUNTER — Telehealth: Payer: Self-pay | Admitting: Internal Medicine

## 2015-01-07 NOTE — Telephone Encounter (Signed)
°  1. Has your device fired? No  2. Is you device beeping? No  3. Are you experiencing draining or swelling at device site? No  4. Are you calling to see if we received your device transmission? No  5. Have you passed out? No  Pt's wife calling stating that pt has a spot above his device that is very sore and tender to touch and they want to know if that's something they should worry about. Please call pt back and advise.

## 2015-01-08 ENCOUNTER — Other Ambulatory Visit: Payer: Self-pay | Admitting: *Deleted

## 2015-01-08 MED ORDER — LOSARTAN POTASSIUM 50 MG PO TABS: 50.0000 mg | ORAL_TABLET | Freq: Every day | ORAL | Status: DC

## 2015-01-08 NOTE — Telephone Encounter (Signed)
Spoke to patient on 6/16 @ 1332 about device site. Appt made for 6/17 @ 1400 for wound check.

## 2015-01-26 ENCOUNTER — Ambulatory Visit (INDEPENDENT_AMBULATORY_CARE_PROVIDER_SITE_OTHER): Payer: BLUE CROSS/BLUE SHIELD | Admitting: *Deleted

## 2015-01-26 ENCOUNTER — Telehealth: Payer: Self-pay | Admitting: Cardiology

## 2015-01-26 ENCOUNTER — Encounter: Payer: Self-pay | Admitting: Internal Medicine

## 2015-01-26 DIAGNOSIS — I429 Cardiomyopathy, unspecified: Secondary | ICD-10-CM

## 2015-01-26 DIAGNOSIS — I5022 Chronic systolic (congestive) heart failure: Secondary | ICD-10-CM

## 2015-01-26 NOTE — Telephone Encounter (Signed)
LMOVM reminding pt to send remote transmission.   

## 2015-01-26 NOTE — Progress Notes (Signed)
Remote ICD transmission.   

## 2015-02-04 ENCOUNTER — Other Ambulatory Visit: Payer: Self-pay | Admitting: Internal Medicine

## 2015-02-05 LAB — CUP PACEART REMOTE DEVICE CHECK
Battery Remaining Percentage: 68 %
Brady Statistic RV Percent Paced: 1 % — CL
HighPow Impedance: 70 Ohm
Lead Channel Sensing Intrinsic Amplitude: 11.8 mV
Lead Channel Setting Pacing Amplitude: 2.5 V
Lead Channel Setting Pacing Pulse Width: 0.5 ms
Lead Channel Setting Sensing Sensitivity: 0.5 mV
MDC IDC MSMT LEADCHNL RV IMPEDANCE VALUE: 350 Ohm
MDC IDC SESS DTM: 20160715170139
Pulse Gen Serial Number: 1005776
Zone Setting Detection Interval: 310 ms

## 2015-02-17 ENCOUNTER — Encounter: Payer: Self-pay | Admitting: *Deleted

## 2015-03-03 ENCOUNTER — Encounter: Payer: Self-pay | Admitting: Internal Medicine

## 2015-04-29 ENCOUNTER — Ambulatory Visit (INDEPENDENT_AMBULATORY_CARE_PROVIDER_SITE_OTHER): Payer: BLUE CROSS/BLUE SHIELD | Admitting: *Deleted

## 2015-04-29 ENCOUNTER — Telehealth: Payer: Self-pay | Admitting: Cardiology

## 2015-04-29 DIAGNOSIS — I5022 Chronic systolic (congestive) heart failure: Secondary | ICD-10-CM | POA: Diagnosis not present

## 2015-04-29 DIAGNOSIS — I429 Cardiomyopathy, unspecified: Secondary | ICD-10-CM | POA: Diagnosis not present

## 2015-04-29 NOTE — Telephone Encounter (Signed)
LMOVM reminding pt to send remote transmission.   

## 2015-04-30 NOTE — Progress Notes (Signed)
Remote ICD transmission.   

## 2015-05-17 LAB — CUP PACEART REMOTE DEVICE CHECK
Battery Remaining Longevity: 66 mo
Battery Remaining Percentage: 65 %
Brady Statistic RV Percent Paced: 1 % — CL
HighPow Impedance: 70 Ohm
Implantable Lead Implant Date: 20120813
Implantable Lead Location: 753860
Implantable Lead Model: 180
Implantable Lead Serial Number: 303783
MDC IDC MSMT LEADCHNL RV IMPEDANCE VALUE: 330 Ohm
MDC IDC MSMT LEADCHNL RV SENSING INTR AMPL: 11.8 mV
MDC IDC PG SERIAL: 1005776
MDC IDC SESS DTM: 20161024140029
MDC IDC SET LEADCHNL RV PACING AMPLITUDE: 2.5 V
MDC IDC SET LEADCHNL RV PACING PULSEWIDTH: 0.5 ms
MDC IDC SET LEADCHNL RV SENSING SENSITIVITY: 0.5 mV

## 2015-05-21 ENCOUNTER — Encounter: Payer: Self-pay | Admitting: Cardiology

## 2015-05-21 ENCOUNTER — Encounter: Payer: Self-pay | Admitting: Internal Medicine

## 2015-07-29 ENCOUNTER — Telehealth: Payer: Self-pay | Admitting: Cardiology

## 2015-07-29 ENCOUNTER — Ambulatory Visit (INDEPENDENT_AMBULATORY_CARE_PROVIDER_SITE_OTHER): Payer: BLUE CROSS/BLUE SHIELD | Admitting: *Deleted

## 2015-07-29 DIAGNOSIS — I5022 Chronic systolic (congestive) heart failure: Secondary | ICD-10-CM | POA: Diagnosis not present

## 2015-07-29 DIAGNOSIS — I429 Cardiomyopathy, unspecified: Secondary | ICD-10-CM | POA: Diagnosis not present

## 2015-07-29 NOTE — Telephone Encounter (Signed)
LMOVM reminding pt to send remote transmission.   

## 2015-07-30 NOTE — Progress Notes (Signed)
Remote ICD transmission.   

## 2015-08-06 ENCOUNTER — Other Ambulatory Visit: Payer: Self-pay | Admitting: Cardiology

## 2015-08-17 LAB — CUP PACEART REMOTE DEVICE CHECK
Battery Remaining Percentage: 64 %
Brady Statistic RV Percent Paced: 1 % — CL
HIGH POWER IMPEDANCE MEASURED VALUE: 62 Ohm
Implantable Lead Implant Date: 20120813
Implantable Lead Model: 180
Lead Channel Sensing Intrinsic Amplitude: 11.8 mV
MDC IDC LEAD LOCATION: 753860
MDC IDC LEAD SERIAL: 303783
MDC IDC MSMT BATTERY REMAINING LONGEVITY: 65 mo
MDC IDC MSMT LEADCHNL RV IMPEDANCE VALUE: 330 Ohm
MDC IDC PG SERIAL: 1005776
MDC IDC SESS DTM: 20170124111102

## 2015-08-20 ENCOUNTER — Encounter: Payer: Self-pay | Admitting: Cardiology

## 2015-10-30 ENCOUNTER — Other Ambulatory Visit: Payer: Self-pay | Admitting: Internal Medicine

## 2015-10-30 ENCOUNTER — Other Ambulatory Visit: Payer: Self-pay | Admitting: Cardiology

## 2015-11-04 ENCOUNTER — Ambulatory Visit (INDEPENDENT_AMBULATORY_CARE_PROVIDER_SITE_OTHER): Payer: BLUE CROSS/BLUE SHIELD | Admitting: Internal Medicine

## 2015-11-04 ENCOUNTER — Encounter: Payer: Self-pay | Admitting: Internal Medicine

## 2015-11-04 VITALS — BP 152/84 | HR 57 | Ht 69.0 in | Wt 149.0 lb

## 2015-11-04 DIAGNOSIS — I5022 Chronic systolic (congestive) heart failure: Secondary | ICD-10-CM

## 2015-11-04 DIAGNOSIS — I429 Cardiomyopathy, unspecified: Secondary | ICD-10-CM

## 2015-11-04 NOTE — Patient Instructions (Addendum)
Medication Instructions:  Your physician has recommended you make the following change in your medication:  1) Increase Furosemide to 40 mg daily for 3 days--- call office and let us know if it makes you feel better  If so will increase to 40 mg daily.      Labwork: None ordered   Testing/Procedures: None ordered   Follow-Up: Your physician wants you to follow-up in: 12 months with Dr Court Joy will receive a reminder letter in the mail two months in advance. If you don't receive a letter, please call our office to schedule the follow-up appointment.   Remote monitoring is used to monitor your  ICD from home. This monitoring reduces the number of office visits required to check your device to one time per year. It allows [place] to keep an eye on the functioning of your device to ensure it is working properly. You are scheduled for a device check from home on 02/03/16. You may send your transmission at any time that day. If you have a wireless device, the transmission will be sent automatically. After your physician reviews your transmission, you will receive a postcard with your next transmission date.     Any Other Special Instructions Will Be Listed Below (If Applicable).     If you need a refill on your cardiac medications before your next appointment, please call your pharmacy.

## 2015-11-04 NOTE — Progress Notes (Signed)
HPI [name] returns today for followup. He is a very pleasant 55 year old man with a history of ventricular tachycardia, status post ICD insertion. He has a nonischemic cardiomyopathy with chronic systolic heart failure currently class II. In the interim, he has been stable, but has had trouble working, as he is on a scaffolding, and notes severe dizziness, when he gets up quickly or bends over. He denies chest pain or syncope. No peripheral edema. No ICD shocks. He notes that minimal exertion results in his getting sob. He admits to sodium indiscretion.  No Known Allergies   Current Outpatient Prescriptions  Medication Sig Dispense Refill  . aspirin 81 MG tablet Take 81 mg by mouth daily.      . carvedilol (COREG) 12.5 MG tablet TAKE ONE TABLET BY MOUTH TWICE DAILY WITH MEALS 60 tablet 8  . furosemide (LASIX) 20 MG tablet TAKE ONE TABLET BY MOUTH ONCE DAILY 30 tablet 0  . losartan (COZAAR) 50 MG tablet TAKE ONE TABLET BY MOUTH ONCE DAILY 30 tablet 0  . PACERONE 200 MG tablet TAKE ONE TABLET BY MOUTH ONCE DAILY 30 tablet 0  . spironolactone (ALDACTONE) 25 MG tablet TAKE ONE TABLET BY MOUTH ONCE DAILY 30 tablet 0   No current facility-administered medications for this visit.     Past Medical History  Diagnosis Date  . NICM (nonischemic cardiomyopathy) (HCC)     cath 12/13/10: Normal cors, EF 10-15%;  b. echo 5/12 EF 15%, mild MR, mod LAE, mild RVE, mild to mod RAE, mild to mod TR, PASP 44  . Systolic CHF, chronic (HCC)   . NSVT (nonsustained ventricular tachycardia) (HCC)     Life Vest; amiodarone rx  . Emphysema   . Fluttering heart (HCC)   . Elevated LFTs     ROS:   All systems reviewed and negative except as noted in the HPI.   Past Surgical History  Procedure Laterality Date  . Cardiac catheterization    . 2d echocardiogram  02/2011     Family History  Problem Relation Age of Onset  . Cancer Mother 64  . Coronary artery disease Father     unknown  . Emphysema Father       Social History   Social History  . Marital Status: Married    Spouse Name: N/A  . Number of Children: 1  . Years of Education: N/A   Occupational History  . ELECTRICAL    Social History Main Topics  . Smoking status: Former Smoker -- 1.00 packs/day for 15 years    Quit date: 10/23/2010  . Smokeless tobacco: Not on file  . Alcohol Use: No  . Drug Use: No  . Sexual Activity: Not on file   Other Topics Concern  . Not on file   Social History Narrative    George Barber is married and lives with his wife.  They have     1 child together.  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  He quit alcohol 1 month ago, after a 20-year history     of what he describes as social alcohol, but does endorse drinking 6-8     beers per day.           BP 152/84 mmHg  Pulse 57  Ht 5\' 9"  (1.753 m)  Wt 149 lb (67.586 kg)  BMI 21.99 kg/m2  Physical Exam:  Stable appearing middle-aged man, NAD HEENT: Unremarkable Neck:  No JVD, no thyromegally Lungs:  Clear with no wheezes, rales, or rhonchi. HEART:  Regular rate rhythm, no murmurs, no rubs, no clicks Abd:  soft, positive bowel sounds, no organomegally, no rebound, no guarding Ext:  2 plus pulses, no edema, no cyanosis, no clubbing Skin:  No rashes no nodules Neuro:  CN II through XII intact, motor grossly intact  DEVICE  Normal device function.  See PaceArt for details.   Assess/Plan: 1. VT - he has had no additional ventricular arrhythmias. He will continue amio 2. Chronic systolic heart failure - his symptoms are class 2B. He has been instructed to increase his dose of lasix to 40 mg daily.  3. Non-compliance - he continues to overuse sodium. I have strongly suggested he stop using salt. 4. ICD - his [employer] device is working normally. Will recheck in several months.   Leonia Reeves.D.

## 2015-11-09 LAB — CUP PACEART INCLINIC DEVICE CHECK
Battery Remaining Longevity: 67.2
Brady Statistic RV Percent Paced: 0.01 %
HighPow Impedance: 63 Ohm
Implantable Lead Implant Date: 20120813
Implantable Lead Location: 753860
Implantable Lead Serial Number: 303783
Lead Channel Impedance Value: 325 Ohm
Lead Channel Pacing Threshold Pulse Width: 0.8 ms
Lead Channel Pacing Threshold Pulse Width: 0.8 ms
Lead Channel Setting Pacing Amplitude: 2.5 V
Lead Channel Setting Pacing Pulse Width: 0.8 ms
Lead Channel Setting Sensing Sensitivity: 0.5 mV
MDC IDC LEAD MODEL: 180
MDC IDC MSMT LEADCHNL RV PACING THRESHOLD AMPLITUDE: 1.25 V
MDC IDC MSMT LEADCHNL RV PACING THRESHOLD AMPLITUDE: 1.25 V
MDC IDC MSMT LEADCHNL RV SENSING INTR AMPL: 11.8 mV
MDC IDC PG SERIAL: 1005776
MDC IDC SESS DTM: 20170413130124

## 2015-11-15 ENCOUNTER — Encounter: Payer: Self-pay | Admitting: Internal Medicine

## 2015-11-23 ENCOUNTER — Encounter: Payer: Self-pay | Admitting: Family Medicine

## 2015-11-23 ENCOUNTER — Ambulatory Visit: Payer: BLUE CROSS/BLUE SHIELD | Admitting: Family Medicine

## 2015-11-23 ENCOUNTER — Telehealth: Payer: Self-pay | Admitting: Internal Medicine

## 2015-11-23 ENCOUNTER — Ambulatory Visit (INDEPENDENT_AMBULATORY_CARE_PROVIDER_SITE_OTHER): Payer: BLUE CROSS/BLUE SHIELD

## 2015-11-23 VITALS — BP 131/85 | HR 69 | Temp 98.0°F | Ht 69.0 in | Wt 138.4 lb

## 2015-11-23 DIAGNOSIS — J441 Chronic obstructive pulmonary disease with (acute) exacerbation: Secondary | ICD-10-CM | POA: Diagnosis not present

## 2015-11-23 MED ORDER — PREDNISONE 20 MG PO TABS: ORAL_TABLET | ORAL | Status: DC

## 2015-11-23 MED ORDER — ALBUTEROL SULFATE HFA 108 (90 BASE) MCG/ACT IN AERS: 2.0000 | INHALATION_SPRAY | Freq: Four times a day (QID) | RESPIRATORY_TRACT | Status: DC | PRN

## 2015-11-23 MED ORDER — DOXYCYCLINE HYCLATE 100 MG PO TABS: 100.0000 mg | ORAL_TABLET | Freq: Two times a day (BID) | ORAL | Status: DC

## 2015-11-23 NOTE — Telephone Encounter (Signed)
Returned call to patient.  His SOB is worse than when he was here in April.  He did stop salt and increased his Furosemide but that did not help.  He has a productive cough of which he has had now for over a month.  I have suggested he call his PCP and get in to be seen. He will call me back if needed.

## 2015-11-23 NOTE — Telephone Encounter (Signed)
New Message:  Pt is calling in wanting to speak with the nurse about how he has been doing since the change in medication. Please f/u with pt

## 2015-11-23 NOTE — Progress Notes (Signed)
**Note George-Identified via Obfuscation** BP 131/85 mmHg  Pulse 69  Temp(Src) 98 F (36.7 C) (Oral)  Ht 5\' 9"  (1.753 m)  Wt 138 lb 6.4 oz (62.778 kg)  BMI 20.43 kg/m2   Subjective:    Patient ID: George Barber, male    DOB: 1960/09/16, 55 y.o.   MRN: [id number]  HPI: George Barber is a 55 y.o. male presenting on 11/23/2015 for Shortness of Breath and Cough   HPI Shortness of breath and wheezing Patient has been having increasing shortness of breath over the past 2 weeks. The shortness of breath is especially with exertion if he walks more than 2 blocks. He sees his cardiologist regularly for both CHF and his defibrillator checks. The cardiologist has worked him up extensively and does not think it's related to heart Center over here. The patient does have a smoking history of 1 pack per day for 15 years which he quit 5 years ago. He is not around smoking anymore but he does work with a lot of chemicals and thinks that may be contributory. He denies any fevers or chills. He has had some nasal drainage and postnasal drainage that is been mostly clear. He has coughed up some phlegm but it's been mostly clear.  Relevant past medical, surgical, family and social history reviewed and updated as indicated. Interim medical history since our last visit reviewed. Allergies and medications reviewed and updated.  Review of Systems  Constitutional: Negative for fever and chills.  HENT: Positive for congestion, postnasal drip, rhinorrhea, sinus pressure, sneezing and sore throat. Negative for ear discharge, ear pain and voice change.   Eyes: Negative for pain, discharge, redness and visual disturbance.  Respiratory: Positive for cough, shortness of breath and wheezing. Negative for chest tightness.   Cardiovascular: Negative for chest pain and leg swelling.  Gastrointestinal: Negative for abdominal pain, diarrhea and constipation.  Genitourinary: Negative for difficulty urinating.  Musculoskeletal: Negative for back pain and gait  problem.  Skin: Negative for rash.  Neurological: Negative for syncope, light-headedness and headaches.  All other systems reviewed and are negative.  Social History   Social History  . Marital Status: Married    Spouse Name: N/A  . Number of Children: 1  . Years of Education: N/A   Occupational History  . ELECTRICAL    Social History Main Topics  . Smoking status: Former Smoker -- 1.00 packs/day for 15 years    Quit date: 10/23/2010  . Smokeless tobacco: Not on file  . Alcohol Use: No  . Drug Use: No  . Sexual Activity: Not on file   Other Topics Concern  . Not on file   Social History Narrative    Mr. Demmons is married and lives with his wife.  They have     1 child together.  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  He quit alcohol 1 month ago, after a 20-year history     of what he describes as social alcohol, but does endorse drinking 6-8     beers per day.          Per HPI unless specifically indicated above Past Surgical History  Procedure Laterality Date  . Cardiac catheterization    . 2d echocardiogram  02/2011       Medication List       This list is accurate as of: 11/23/15 11:52 AM.  Always use your most recent med list.  albuterol 108 (90 Base) MCG/ACT inhaler  Commonly known as:  PROVENTIL HFA;VENTOLIN HFA  Inhale 2 puffs into the lungs every 6 (six) hours as needed for wheezing or shortness of breath.     aspirin 81 MG tablet  Take 81 mg by mouth daily.     carvedilol 12.5 MG tablet  Commonly known as:  COREG  TAKE ONE TABLET BY MOUTH TWICE DAILY WITH MEALS     doxycycline 100 MG tablet  Commonly known as:  VIBRA-TABS  Take 1 tablet (100 mg total) by mouth 2 (two) times daily. 1 po bid     furosemide 20 MG tablet  Commonly known as:  LASIX  TAKE ONE TABLET BY MOUTH ONCE DAILY     losartan 50 MG tablet  Commonly known as:  COZAAR  TAKE ONE TABLET BY MOUTH ONCE DAILY     PACERONE 200 MG  tablet  Generic drug:  amiodarone  TAKE ONE TABLET BY MOUTH ONCE DAILY     predniSONE 20 MG tablet  Commonly known as:  DELTASONE  2 po at same time daily for 5 days     spironolactone 25 MG tablet  Commonly known as:  ALDACTONE  TAKE ONE TABLET BY MOUTH ONCE DAILY           Objective:    BP 131/85 mmHg  Pulse 69  Temp(Src) 98 F (36.7 C) (Oral)  Ht  (1.753 m)  Wt 138 lb 6.4 oz (62.778 kg)  BMI 20.43 kg/m2  Wt Readings from Last 3 Encounters:  11/23/15 138 lb 6.4 oz (62.778 kg)  11/04/15 149 lb (67.586 kg)  10/27/14 148 lb 12.8 oz (67.495 kg)    Physical Exam  Constitutional: He is oriented to person, place, and time. He appears well-developed and well-nourished. No distress.  HENT:  Right Ear: Tympanic membrane, external ear and ear canal normal.  Left Ear: Tympanic membrane, external ear and ear canal normal.  Nose: Mucosal edema and rhinorrhea present. No sinus tenderness. No epistaxis. Right sinus exhibits maxillary sinus tenderness. Right sinus exhibits no frontal sinus tenderness. Left sinus exhibits maxillary sinus tenderness. Left sinus exhibits no frontal sinus tenderness.  Mouth/Throat: Uvula is midline and mucous membranes are normal. Posterior oropharyngeal edema and posterior oropharyngeal erythema present. No oropharyngeal exudate or tonsillar abscesses.  Eyes: Conjunctivae and EOM are normal. Pupils are equal, round, and reactive to light. Right eye exhibits no discharge. No scleral icterus.  Neck: Neck supple. No thyromegaly present.  Cardiovascular: Normal rate, regular rhythm, normal heart sounds and intact distal pulses.   No murmur heard. Pulmonary/Chest: Effort normal. No respiratory distress. He has wheezes in the right upper field and the left upper field. He has no rhonchi. He has no rales.  Musculoskeletal: Normal range of motion. He exhibits no edema.  Lymphadenopathy:    He has no cervical adenopathy.  Neurological: He is alert and oriented  to person, place, and time. Coordination normal.  Skin: Skin is warm and dry. No rash noted. He is not diaphoretic.  Psychiatric: He has a normal mood and affect. His behavior is normal.  Nursing note and vitals reviewed.  CXR: No acute cardiopulmonary abnormality noted. await read by radiologist    Assessment & Plan:       Problem List Items Addressed This Visit    None    Visit Diagnoses    COPD exacerbation (HCC)    -  Primary    Has 15-pack-year smoking history, wheezing, treat like COPD  exacerbation, return in 2 weeks, also take allergy pill    Relevant Medications    predniSONE (DELTASONE) 20 MG tablet    doxycycline (VIBRA-TABS) 100 MG tablet    albuterol (PROVENTIL HFA;VENTOLIN HFA) 108 (90 Base) MCG/ACT inhaler    Other Relevant Orders    DG Chest 2 View        Follow up plan: Return in about 2 weeks (around 12/07/2015), or if symptoms worsen or fail to improve, for Recheck breathing.  Counseling provided for all of the vaccine components Orders Placed This Encounter  Procedures  . DG Chest 2 View    Arville Care, MD [facility] 11/23/2015, 11:52 AM

## 2015-11-26 ENCOUNTER — Telehealth: Payer: Self-pay | Admitting: Family Medicine

## 2015-11-26 NOTE — Telephone Encounter (Signed)
Lungs show chronic COPD changes but no pneumonia or anything acute

## 2015-11-26 NOTE — Telephone Encounter (Signed)
Please review  CXR and nurse will call patient with results.

## 2015-12-02 NOTE — Telephone Encounter (Signed)
Patient aware of xray results.   

## 2015-12-03 ENCOUNTER — Other Ambulatory Visit: Payer: Self-pay | Admitting: Internal Medicine

## 2015-12-24 ENCOUNTER — Telehealth: Payer: Self-pay | Admitting: Family Medicine

## 2015-12-29 ENCOUNTER — Encounter: Payer: Self-pay | Admitting: Internal Medicine

## 2015-12-29 ENCOUNTER — Ambulatory Visit (INDEPENDENT_AMBULATORY_CARE_PROVIDER_SITE_OTHER): Payer: BLUE CROSS/BLUE SHIELD | Admitting: Family Medicine

## 2015-12-29 ENCOUNTER — Encounter: Payer: Self-pay | Admitting: Family Medicine

## 2015-12-29 VITALS — BP 163/89 | HR 73 | Temp 98.2°F | Ht 69.0 in | Wt 144.0 lb

## 2015-12-29 DIAGNOSIS — J441 Chronic obstructive pulmonary disease with (acute) exacerbation: Secondary | ICD-10-CM | POA: Diagnosis not present

## 2015-12-29 DIAGNOSIS — J069 Acute upper respiratory infection, unspecified: Secondary | ICD-10-CM

## 2015-12-29 MED ORDER — UMECLIDINIUM-VILANTEROL 62.5-25 MCG/INH IN AEPB: INHALATION_SPRAY | RESPIRATORY_TRACT | Status: DC

## 2015-12-29 MED ORDER — PREDNISONE 10 MG PO TABS: ORAL_TABLET | ORAL | Status: DC

## 2015-12-29 MED ORDER — ALBUTEROL SULFATE HFA 108 (90 BASE) MCG/ACT IN AERS: 2.0000 | INHALATION_SPRAY | Freq: Four times a day (QID) | RESPIRATORY_TRACT | Status: DC | PRN

## 2015-12-29 MED ORDER — AMOXICILLIN-POT CLAVULANATE 875-125 MG PO TABS: 1.0000 | ORAL_TABLET | Freq: Two times a day (BID) | ORAL | Status: DC

## 2015-12-29 MED ORDER — LEVOFLOXACIN 500 MG PO TABS: 500.0000 mg | ORAL_TABLET | Freq: Every day | ORAL | Status: DC

## 2015-12-29 MED ORDER — ALBUTEROL SULFATE (2.5 MG/3ML) 0.083% IN NEBU
2.5000 mg | INHALATION_SOLUTION | Freq: Once | RESPIRATORY_TRACT | Status: AC
  Administered 2015-12-29: 2.5 mg via RESPIRATORY_TRACT

## 2015-12-29 NOTE — Progress Notes (Signed)
BP 163/89 mmHg  Pulse 73  Temp(Src) 98.2 F (36.8 C) (Oral)  Ht  (1.753 m)  Wt 144 lb (65.318 kg)  BMI 21.26 kg/m2  SpO2 96%   Subjective:    Patient ID: [name], male    DOB: Aug 21, 1960, 55 y.o.   MRN: [id number]  HPI: George Barber is a 55 y.o. male presenting on 12/29/2015 for URI   URI  This is a recurrent problem. The current episode started more than 1 month ago. The problem has been gradually worsening. There has been no fever. Associated symptoms include congestion, coughing, rhinorrhea and wheezing. Pertinent negatives include no abdominal pain, chest pain, diarrhea, ear pain, headaches, rash, sneezing or sore throat.   Shortness of breath and wheezing Patient has been having increasing shortness of breath over the past 6 weeks. The shortness of breath is especially with exertion if he walks more than 1 block (from here to his truck - out in parking lot.). He sees his cardiologist regularly for both CHF and his defibrillator checks. Denies edema, but legs feel tired.The cardiologist has worked him up extensively and does not think it's related to heart. The patient does have a smoking history of 1 pack per day for 15 years which he quit 5 years ago. He is not around smoking anymore but he does work with a lot of chemicals. . He has frequent cough with occasional clear thick sputum.  Relevant past medical, surgical, family and social history reviewed and updated as indicated. Interim medical history since our last visit reviewed. Allergies and medications reviewed and updated.  Review of Systems  Constitutional: Negative for fever and chills.  HENT: Positive for congestion and rhinorrhea. Negative for ear discharge, ear pain, postnasal drip, sinus pressure, sneezing, sore throat and voice change.   Eyes: Negative for pain, discharge, redness and visual disturbance.  Respiratory: Positive for cough, shortness of breath and wheezing. Negative for chest tightness.     Cardiovascular: Negative for chest pain and leg swelling.  Gastrointestinal: Negative for abdominal pain, diarrhea and constipation.  Genitourinary: Negative for difficulty urinating.  Musculoskeletal: Negative for back pain and gait problem.  Skin: Negative for rash.  Neurological: Negative for syncope, light-headedness and headaches.  All other systems reviewed and are negative.  Social History   Social History  . Marital Status: Married    Spouse Name: N/A  . Number of Children: 1  . Years of Education: N/A   Occupational History  . ELECTRICAL    Social History Main Topics  . Smoking status: Former Smoker -- 1.00 packs/day for 15 years    Quit date: 10/23/2010  . Smokeless tobacco: Not on file  . Alcohol Use: No  . Drug Use: No  . Sexual Activity: Not on file   Other Topics Concern  . Not on file   Social History Narrative    [name] is married and lives with his wife.  They have     1 child together.  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  He quit alcohol 1 month ago, after a 20-year history     of what he describes as social alcohol, but does endorse drinking 6-8     beers per day.          Per HPI unless specifically indicated above Past Surgical History  Procedure Laterality Date  . Cardiac catheterization    . 2d  echocardiogram  02/2011       Medication List       This list is accurate as of: 12/29/15 12:41 PM.  Always use your most recent med list.               albuterol 108 (90 Base) MCG/ACT inhaler  Commonly known as:  PROVENTIL HFA;VENTOLIN HFA  Inhale 2 puffs into the lungs every 6 (six) hours as needed for wheezing or shortness of breath.     amiodarone 200 MG tablet  Commonly known as:  PACERONE  TAKE ONE TABLET BY MOUTH ONCE DAILY     amoxicillin-clavulanate 875-125 MG tablet  Commonly known as:  AUGMENTIN  Take 1 tablet by mouth 2 (two) times daily. Take all of this medication     aspirin 81 MG  tablet  Take 81 mg by mouth daily.     carvedilol 12.5 MG tablet  Commonly known as:  COREG  TAKE ONE TABLET BY MOUTH TWICE DAILY WITH MEALS     furosemide 20 MG tablet  Commonly known as:  LASIX  TAKE ONE TABLET BY MOUTH ONCE DAILY     losartan 50 MG tablet  Commonly known as:  COZAAR  TAKE ONE TABLET BY MOUTH ONCE DAILY     predniSONE 10 MG tablet  Commonly known as:  DELTASONE  Take 5 daily for 3 days followed by 4,3,2 and 1 for 3 days each.     spironolactone 25 MG tablet  Commonly known as:  ALDACTONE  TAKE ONE TABLET BY MOUTH ONCE DAILY     umeclidinium-vilanterol 62.5-25 MCG/INH Aepb  Commonly known as:  ANORO ELLIPTA  One puff daily           Objective:    BP 163/89 mmHg  Pulse 73  Temp(Src) 98.2 F (36.8 C) (Oral)  Ht 5\' 9"  (1.753 m)  Wt 144 lb (65.318 kg)  BMI 21.26 kg/m2  SpO2 96%  Wt Readings from Last 3 Encounters:  12/29/15 144 lb (65.318 kg)  11/23/15 138 lb 6.4 oz (62.778 kg)  11/04/15 149 lb (67.586 kg)    Physical Exam  Constitutional: He is oriented to person, place, and time. He appears well-developed and well-nourished. No distress.  HENT:  Right Ear: Tympanic membrane, external ear and ear canal normal.  Left Ear: Tympanic membrane, external ear and ear canal normal.  Nose: Mucosal edema and rhinorrhea present. No sinus tenderness. No epistaxis. Right sinus exhibits maxillary sinus tenderness. Right sinus exhibits no frontal sinus tenderness. Left sinus exhibits maxillary sinus tenderness. Left sinus exhibits no frontal sinus tenderness.  Mouth/Throat: Uvula is midline and mucous membranes are normal. Posterior oropharyngeal edema and posterior oropharyngeal erythema present. No oropharyngeal exudate or tonsillar abscesses.  Eyes: Conjunctivae and EOM are normal. Pupils are equal, round, and reactive to light. Right eye exhibits no discharge. No scleral icterus.  Neck: Neck supple. No thyromegaly present.  Cardiovascular: Normal rate,  regular rhythm, normal heart sounds and intact distal pulses.   No murmur heard. Pulmonary/Chest: Effort normal. No respiratory distress. He has wheezes in the right upper field and the left upper field. He has no rhonchi. He has no rales.  Musculoskeletal: Normal range of motion. He exhibits no edema.  Lymphadenopathy:    He has no cervical adenopathy.  Neurological: He is alert and oriented to person, place, and time. Coordination normal.  Skin: Skin is warm and dry. No rash noted. He is not diaphoretic.  Psychiatric: He has a normal mood and  affect. His behavior is normal.  Nursing note and vitals reviewed.  CXR: No acute cardiopulmonary abnormality noted. await read by radiologist    Assessment & Plan:   Problem List Items Addressed This Visit    None    Visit Diagnoses    Acute exacerbation of chronic obstructive pulmonary disease (COPD) (HCC)    -  Primary    Relevant Medications    albuterol (PROVENTIL) (2.5 MG/3ML) 0.083% nebulizer solution 2.5 mg    umeclidinium-vilanterol (ANORO ELLIPTA) 62.5-25 MCG/INH AEPB    predniSONE (DELTASONE) 10 MG tablet    albuterol (PROVENTIL HFA;VENTOLIN HFA) 108 (90 Base) MCG/ACT inhaler    Other Relevant Orders    PR EVAL OF BRONCHOSPASM (Completed)    COPD exacerbation (HCC)        Has 15-pack-year smoking history, wheezing, treat like COPD exacerbation, return in 2 weeks, also take allergy pill    Relevant Medications    albuterol (PROVENTIL) (2.5 MG/3ML) 0.083% nebulizer solution 2.5 mg    umeclidinium-vilanterol (ANORO ELLIPTA) 62.5-25 MCG/INH AEPB    predniSONE (DELTASONE) 10 MG tablet    albuterol (PROVENTIL HFA;VENTOLIN HFA) 108 (90 Base) MCG/ACT inhaler        Follow up plan: Return in about 2 weeks (around 01/12/2016).  Counseling provided for all of the vaccine components Orders Placed This Encounter  Procedures  . PR EVAL OF BRONCHOSPASM    Mechele Claude, M.D. [facility] 12/29/2015, 12:41  PM

## 2015-12-29 NOTE — Addendum Note (Signed)
Addended by: Bearl Mulberry on: 12/29/2015 01:06 PM   Modules accepted: Kipp Brood

## 2015-12-29 NOTE — Patient Instructions (Signed)
If prednisone causes insomnia, try taking as much as possible early in the day with food. Also try taking 1-2 Tylenol PM at bedtime

## 2015-12-31 NOTE — Telephone Encounter (Signed)
Patient was seen on 6/7

## 2016-01-16 ENCOUNTER — Encounter: Payer: Self-pay | Admitting: Internal Medicine

## 2016-01-26 ENCOUNTER — Encounter: Payer: Self-pay | Admitting: Nurse Practitioner

## 2016-01-26 ENCOUNTER — Ambulatory Visit (INDEPENDENT_AMBULATORY_CARE_PROVIDER_SITE_OTHER): Payer: BLUE CROSS/BLUE SHIELD | Admitting: Nurse Practitioner

## 2016-01-26 VITALS — BP 131/86 | HR 56 | Temp 97.6°F | Ht 69.0 in | Wt 144.0 lb

## 2016-01-26 DIAGNOSIS — J449 Chronic obstructive pulmonary disease, unspecified: Secondary | ICD-10-CM | POA: Insufficient documentation

## 2016-01-26 DIAGNOSIS — IMO0001 Reserved for inherently not codable concepts without codable children: Secondary | ICD-10-CM

## 2016-01-26 DIAGNOSIS — J441 Chronic obstructive pulmonary disease with (acute) exacerbation: Secondary | ICD-10-CM | POA: Diagnosis not present

## 2016-01-26 MED ORDER — METHYLPREDNISOLONE ACETATE 80 MG/ML IJ SUSP
80.0000 mg | Freq: Once | INTRAMUSCULAR | Status: AC
  Administered 2016-01-26: 80 mg via INTRAMUSCULAR

## 2016-01-26 MED ORDER — LEVALBUTEROL HCL 1.25 MG/0.5ML IN NEBU
1.2500 mg | INHALATION_SOLUTION | RESPIRATORY_TRACT | Status: AC
  Administered 2016-01-26: 1.25 mg via RESPIRATORY_TRACT

## 2016-01-26 MED ORDER — UMECLIDINIUM-VILANTEROL 62.5-25 MCG/INH IN AEPB: 1.0000 | INHALATION_SPRAY | Freq: Every day | RESPIRATORY_TRACT | Status: DC

## 2016-01-26 NOTE — Patient Instructions (Signed)
Chronic Obstructive Pulmonary Disease Chronic obstructive pulmonary disease (COPD) is a common lung condition in which airflow from the lungs is limited. COPD is a general term that can be used to describe many different lung problems that limit airflow, including both chronic bronchitis and emphysema. If you have COPD, your lung function will probably never return to normal, but there are measures you can take to improve lung function and make yourself feel better. CAUSES   Smoking (common).  Exposure to secondhand smoke.  Genetic problems.  Chronic inflammatory lung diseases or recurrent infections. SYMPTOMS  Shortness of breath, especially with physical activity.  Deep, persistent (chronic) cough with a large amount of thick mucus.  Wheezing.  Rapid breaths (tachypnea).  Gray or bluish discoloration (cyanosis) of the skin, especially in your fingers, toes, or lips.  Fatigue.  Weight loss.  Frequent infections or episodes when breathing symptoms become much worse (exacerbations).  Chest tightness. DIAGNOSIS Your health care provider will take a medical history and perform a physical examination to diagnose COPD. Additional tests for COPD may include:  Lung (pulmonary) function tests.  Chest X-ray.  CT scan.  Blood tests. TREATMENT  Treatment for COPD may include:  Inhaler and nebulizer medicines. These help manage the symptoms of COPD and make your breathing more comfortable.  Supplemental oxygen. Supplemental oxygen is only helpful if you have a low oxygen level in your blood.  Exercise and physical activity. These are beneficial for nearly all people with COPD.  Lung surgery or transplant.  Nutrition therapy to gain weight, if you are underweight.  Pulmonary rehabilitation. This may involve working with a team of health care providers and specialists, such as respiratory, occupational, and physical therapists. HOME CARE INSTRUCTIONS  Take all medicines  (inhaled or pills) as directed by your health care provider.  Avoid over-the-counter medicines or cough syrups that dry up your airway (such as antihistamines) and slow down the elimination of secretions unless instructed otherwise by your health care provider.  If you are a smoker, the most important thing that you can do is stop smoking. Continuing to smoke will cause further lung damage and breathing trouble. Ask your health care provider for help with quitting smoking. He or she can direct you to community resources or hospitals that provide support.  Avoid exposure to irritants such as smoke, chemicals, and fumes that aggravate your breathing.  Use oxygen therapy and pulmonary rehabilitation if directed by your health care provider. If you require home oxygen therapy, ask your health care provider whether you should purchase a pulse oximeter to measure your oxygen level at home.  Avoid contact with individuals who have a contagious illness.  Avoid extreme temperature and humidity changes.  Eat healthy foods. Eating smaller, more frequent meals and resting before meals may help you maintain your strength.  Stay active, but balance activity with periods of rest. Exercise and physical activity will help you maintain your ability to do things you want to do.  Preventing infection and hospitalization is very important when you have COPD. Make sure to receive all the vaccines your health care provider recommends, especially the pneumococcal and influenza vaccines. Ask your health care provider whether you need a pneumonia vaccine.  Learn and use relaxation techniques to manage stress.  Learn and use controlled breathing techniques as directed by your health care provider. Controlled breathing techniques include:  Pursed lip breathing. Start by breathing in (inhaling) through your nose for 1 second. Then, purse your lips as if you were   going to whistle and breathe out (exhale) through the  pursed lips for 2 seconds.  Diaphragmatic breathing. Start by putting one hand on your abdomen just above your waist. Inhale slowly through your nose. The hand on your abdomen should move out. Then purse your lips and exhale slowly. You should be able to feel the hand on your abdomen moving in as you exhale.  Learn and use controlled coughing to clear mucus from your lungs. Controlled coughing is a series of short, progressive coughs. The steps of controlled coughing are: 1. Lean your head slightly forward. 2. Breathe in deeply using diaphragmatic breathing. 3. Try to hold your breath for 3 seconds. 4. Keep your mouth slightly open while coughing twice. 5. Spit any mucus out into a tissue. 6. Rest and repeat the steps once or twice as needed. SEEK MEDICAL CARE IF:  You are coughing up more mucus than usual.  There is a change in the color or thickness of your mucus.  Your breathing is more labored than usual.  Your breathing is faster than usual. SEEK IMMEDIATE MEDICAL CARE IF:  You have shortness of breath while you are resting.  You have shortness of breath that prevents you from:  Being able to talk.  Performing your usual physical activities.  You have chest pain lasting longer than 5 minutes.  Your skin color is more cyanotic than usual.  You measure low oxygen saturations for longer than 5 minutes with a pulse oximeter. MAKE SURE YOU:  Understand these instructions.  Will watch your condition.  Will get help right away if you are not doing well or get worse.   This information is not intended to replace advice given to you by your health care provider. Make sure you discuss any questions you have with your health care provider.   Document Released: 04/19/2005 Document Revised: 07/31/2014 Document Reviewed: 03/06/2013 Elsevier Interactive Patient Education 2016 Elsevier Inc[employer]  

## 2016-01-26 NOTE — Progress Notes (Signed)
   Subjective:    Patient ID: [name], male    DOB: October 30, 1960, 55 y.o.   MRN: [id number]  HPI Patient in c/o of SOB- he has history COPD- suppose to be anoro and albuterol. He has ran out anoro rx - he has albuterol 2x a day- he has history of smoking but quit 7 years ago.  He saw Dr. Darlyn Read on 12/29/15 and was given the anoro inhaler and prednisone. He said he felt much better after taking prednisone.   Review of Systems  Constitutional: Negative.  Negative for fever and chills.  HENT: Negative.  Negative for congestion.   Respiratory: Positive for cough and shortness of breath.   Cardiovascular: Negative.   Genitourinary: Negative.   Neurological: Negative.   Psychiatric/Behavioral: Negative.   All other systems reviewed and are negative. .      Objective:   Physical Exam  Constitutional: He is oriented to person, place, and time. He appears well-developed and well-nourished. No distress.  Cardiovascular: Normal rate, regular rhythm and normal heart sounds.   Pulmonary/Chest: Effort normal. He has wheezes (insp and exp tight wheezes throughout).  Neurological: He is alert and oriented to person, place, and time.  Skin: Skin is warm.  Psychiatric: He has a normal mood and affect. His behavior is normal. Judgment and thought content normal.     BP 131/86 mmHg  Pulse 56  Temp(Src) 97.6 F (36.4 C) (Oral)  Ht 5\' 9"  (1.753 m)  Wt 144 lb (65.318 kg)  BMI 21.26 kg/m2  SpO2 95%      Assessment & Plan:  1. COPD exacerbation (HCC) Need to use anoro daily Only use albuterol as needed RTO prn Meds ordered this encounter  Medications  . levalbuterol (XOPENEX) nebulizer solution 1.25 mg    Sig:   . umeclidinium-vilanterol (ANORO ELLIPTA) 62.5-25 MCG/INH AEPB    Sig: Inhale 1 puff into the lungs daily.    Dispense:  1 each    Refill:  5    Order Specific Question:  Supervising Provider    Answer:  VINCENT, CAROL L [[id number]]  . methylPREDNISolone acetate (DEPO-MEDROL)  injection 80 mg    Sig:      Mary-Margaret Daphine Deutscher, FNP

## 2016-02-03 ENCOUNTER — Ambulatory Visit (INDEPENDENT_AMBULATORY_CARE_PROVIDER_SITE_OTHER): Payer: BLUE CROSS/BLUE SHIELD | Admitting: *Deleted

## 2016-02-03 DIAGNOSIS — I429 Cardiomyopathy, unspecified: Secondary | ICD-10-CM | POA: Diagnosis not present

## 2016-02-03 DIAGNOSIS — Z95810 Presence of automatic (implantable) cardiac defibrillator: Secondary | ICD-10-CM | POA: Diagnosis not present

## 2016-02-03 NOTE — Progress Notes (Signed)
Remote ICD transmission.   

## 2016-02-08 LAB — CUP PACEART REMOTE DEVICE CHECK
HIGH POWER IMPEDANCE MEASURED VALUE: 68 Ohm
Implantable Lead Location: 753860
Implantable Lead Model: 180
Implantable Lead Serial Number: 303783
Lead Channel Impedance Value: 330 Ohm
Lead Channel Sensing Intrinsic Amplitude: 11.8 mV
Lead Channel Setting Pacing Amplitude: 2.5 V
MDC IDC LEAD IMPLANT DT: 20120813
MDC IDC PG SERIAL: 1005776
MDC IDC SESS DTM: 20170718150639
MDC IDC SET LEADCHNL RV PACING PULSEWIDTH: 0.8 ms
MDC IDC SET LEADCHNL RV SENSING SENSITIVITY: 0.5 mV

## 2016-02-09 ENCOUNTER — Encounter: Payer: Self-pay | Admitting: Cardiology

## 2016-02-22 ENCOUNTER — Telehealth: Payer: Self-pay

## 2016-02-22 NOTE — Telephone Encounter (Signed)
Referred to ICM clinic by Acie Fredrickson, device nurse/Dr Ladona Ridgel.  Attempted patient call for ICM introduction and no answer.

## 2016-02-25 NOTE — Telephone Encounter (Signed)
Attempted call for ICM intro.

## 2016-02-25 NOTE — Telephone Encounter (Signed)
Patient returned call.  Explained ICM program and he agreed to monthly ICM calls.  Reviewed last ICM transmission and discussed dates of decreased impedance.  He does not follow low salt and knows he needs to.  Advised to limit salt to 2000 mg daily and fluid intake 64 oz day.  He weighs daily and today weighed 150 lbs.  1st ICM remote transmission scheduled for 03/15/2016.  He does not have monitor by bedside and encouraged to move at bedside table so transmission will be automatic.

## 2016-03-15 ENCOUNTER — Ambulatory Visit (INDEPENDENT_AMBULATORY_CARE_PROVIDER_SITE_OTHER): Payer: BLUE CROSS/BLUE SHIELD

## 2016-03-15 ENCOUNTER — Telehealth: Payer: Self-pay | Admitting: Cardiology

## 2016-03-15 DIAGNOSIS — I5022 Chronic systolic (congestive) heart failure: Secondary | ICD-10-CM

## 2016-03-15 DIAGNOSIS — Z95810 Presence of automatic (implantable) cardiac defibrillator: Secondary | ICD-10-CM | POA: Diagnosis not present

## 2016-03-15 NOTE — Telephone Encounter (Signed)
LMOVM reminding pt to send remote transmission.   

## 2016-03-16 ENCOUNTER — Telehealth: Payer: Self-pay

## 2016-03-16 NOTE — Progress Notes (Addendum)
EPIC Encounter for ICM Monitoring  Patient Name: George Barber is a 55 y.o. male Date: 03/16/2016 Primary Care Physican: Mechele Claude, MD Primary Cardiologist: Ladona Ridgel Electrophysiologist: Ladona Ridgel Dry Weight: 151 lb        Heart Failure questions reviewed, pt asymptomatic   Thoracic impedance abnormal suggesting fluid accumulation 02/28/2016 to 03/06/2016 and 03/14/2016 to 03/16/2016.  Recommendations:  Increased Furosemide 20 mg to bid x 2 days and then resume Furosemide 20 mg daily.  He admits he does not follow low salt diet and is probably drinking more than 64 oz daily.  Advised to follow limitations of salt to 2000 mg daily and fluid intake to 64 oz daily.    BMET ordered for 03/20/2016.     Follow-up plan: ICM clinic phone appointment on 03/22/2016.  Copy of ICM check sent to device physician.   ICM trend: 03/16/2016       Karie Soda, RN 03/16/2016 11:09 AM

## 2016-03-16 NOTE — Telephone Encounter (Signed)
1st ICM remote transmission received.  Attempted patient call and left detailed message and requested return call.

## 2016-03-16 NOTE — Addendum Note (Signed)
Addended by: Karie Soda on: 03/16/2016 03:59 PM   Modules accepted: Orders

## 2016-03-20 ENCOUNTER — Other Ambulatory Visit: Payer: BLUE CROSS/BLUE SHIELD | Admitting: *Deleted

## 2016-03-20 DIAGNOSIS — I5022 Chronic systolic (congestive) heart failure: Secondary | ICD-10-CM

## 2016-03-20 LAB — BASIC METABOLIC PANEL
BUN: 15 mg/dL (ref 7–25)
CALCIUM: 9.4 mg/dL (ref 8.6–10.3)
CO2: 26 mmol/L (ref 20–31)
CREATININE: 1.09 mg/dL (ref 0.70–1.33)
Chloride: 105 mmol/L (ref 98–110)
GLUCOSE: 97 mg/dL (ref 65–99)
POTASSIUM: 4.1 mmol/L (ref 3.5–5.3)
Sodium: 139 mmol/L (ref 135–146)

## 2016-03-22 ENCOUNTER — Telehealth: Payer: Self-pay | Admitting: Cardiology

## 2016-03-22 ENCOUNTER — Ambulatory Visit (INDEPENDENT_AMBULATORY_CARE_PROVIDER_SITE_OTHER): Payer: BLUE CROSS/BLUE SHIELD

## 2016-03-22 DIAGNOSIS — Z95810 Presence of automatic (implantable) cardiac defibrillator: Secondary | ICD-10-CM

## 2016-03-22 DIAGNOSIS — I5022 Chronic systolic (congestive) heart failure: Secondary | ICD-10-CM

## 2016-03-22 NOTE — Telephone Encounter (Signed)
Spoke with pt and reminded pt of remote transmission that is due today. Pt verbalized understanding.   

## 2016-03-23 NOTE — Progress Notes (Signed)
EPIC Encounter for ICM Monitoring  Patient Name: George Barber is a 55 y.o. male Date: 03/23/2016 Primary Care Physican: Mechele Claude, MD Primary Cardiologist: Ladona Ridgel Electrophysiologist: Ladona Ridgel Dry Weight: 151 lb        Heart Failure questions reviewed, pt asymptomatic   Since last transmission 03/15/2016, thoracic impedance above baseline and at baseline after increasing Furosemide 20 mg to bid x 2 days.  Recommendations: No changes.  Provided education to limit salt intake to 2000 mg daily.  He reported he has drinking tomato juice and advised it is high in sodium and try to limit the number of cans he has and try the low sodium version.      Follow-up plan: ICM clinic phone appointment on 05/04/2016.  Copy of ICM check sent to device physician.   ICM trend: 03/22/2016       Karie Soda, RN 03/23/2016 11:23 AM

## 2016-05-04 ENCOUNTER — Ambulatory Visit (INDEPENDENT_AMBULATORY_CARE_PROVIDER_SITE_OTHER): Payer: BLUE CROSS/BLUE SHIELD | Admitting: *Deleted

## 2016-05-04 ENCOUNTER — Telehealth: Payer: Self-pay | Admitting: Cardiology

## 2016-05-04 DIAGNOSIS — I5022 Chronic systolic (congestive) heart failure: Secondary | ICD-10-CM | POA: Diagnosis not present

## 2016-05-04 DIAGNOSIS — Z95810 Presence of automatic (implantable) cardiac defibrillator: Secondary | ICD-10-CM | POA: Diagnosis not present

## 2016-05-04 NOTE — Telephone Encounter (Signed)
LMOVM reminding pt to send remote transmission.   

## 2016-05-05 ENCOUNTER — Telehealth: Payer: Self-pay

## 2016-05-05 ENCOUNTER — Encounter: Payer: Self-pay | Admitting: Cardiology

## 2016-05-05 NOTE — Progress Notes (Signed)
EPIC Encounter for ICM Monitoring  Patient Name: George Barber is a 55 y.o. male Date: 05/05/2016 Primary Care Physican: Mechele Claude, MD Primary Cardiologist:Taylor Electrophysiologist: Ladona Ridgel Dry Weight:    unknown     Attempted ICM call and unable to reach.  Transmission reviewed.   Thoracic impedance normal after 10/9.  Impedance abnormal suggesting fluid accumulation 9/19/ to 9/24, 9/29 to 10/2, 10/3 to 10/5, 10/7 to 10/9.  Follow-up plan: ICM clinic phone appointment on 06/05/2016.  Copy of ICM check sent to device physician.   ICM trend: 05/03/2016       Karie Soda, RN 05/05/2016 10:36 AM

## 2016-05-05 NOTE — Telephone Encounter (Signed)
Remote ICM transmission received.  Attempted patient call and left detailed message regarding transmission and next ICM scheduled for 06/05/2016.  Advised to return call for any fluid symptoms or questions.    

## 2016-06-05 ENCOUNTER — Ambulatory Visit (INDEPENDENT_AMBULATORY_CARE_PROVIDER_SITE_OTHER): Payer: BLUE CROSS/BLUE SHIELD

## 2016-06-05 DIAGNOSIS — I5022 Chronic systolic (congestive) heart failure: Secondary | ICD-10-CM | POA: Diagnosis not present

## 2016-06-05 DIAGNOSIS — Z95810 Presence of automatic (implantable) cardiac defibrillator: Secondary | ICD-10-CM

## 2016-06-06 ENCOUNTER — Ambulatory Visit (INDEPENDENT_AMBULATORY_CARE_PROVIDER_SITE_OTHER): Payer: BLUE CROSS/BLUE SHIELD | Admitting: *Deleted

## 2016-06-06 DIAGNOSIS — I429 Cardiomyopathy, unspecified: Secondary | ICD-10-CM | POA: Diagnosis not present

## 2016-06-07 ENCOUNTER — Telehealth: Payer: Self-pay

## 2016-06-07 NOTE — Telephone Encounter (Signed)
Remote ICM transmission received.  Attempted patient call and left detailed message regarding transmission and next ICM scheduled for 07/10/2016.  Advised to return call for any fluid symptoms or questions.    

## 2016-06-07 NOTE — Progress Notes (Signed)
EPIC Encounter for ICM Monitoring  Patient Name: George Barber is a 56 y.o. male Date: 06/07/2016 Primary Care Physican: Mechele Claude, MD Primary Cardiologist:Taylor Electrophysiologist: Fuller Mandril Weight:unknown                                Attempted ICM call and unable to reach. Left detailed message regarding transmission.  Transmission reviewed.   Thoracic impedance normal   Follow-up plan: ICM clinic phone appointment on 07/10/2016.  Copy of ICM check sent to device physician.   ICM trend: 06/05/2016       Karie Soda, RN 06/07/2016 8:16 AM

## 2016-06-09 NOTE — Progress Notes (Signed)
Remote ICD transmission.   

## 2016-06-17 LAB — CUP PACEART REMOTE DEVICE CHECK
Battery Remaining Longevity: 54 mo
Battery Remaining Percentage: 57 %
Brady Statistic RV Percent Paced: 1 % — CL
HIGH POWER IMPEDANCE MEASURED VALUE: 74 Ohm
Implantable Lead Implant Date: 20120813
Implantable Lead Location: 753860
Implantable Lead Serial Number: 303783
Implantable Pulse Generator Implant Date: 20120813
Lead Channel Setting Pacing Pulse Width: 0.8 ms
Lead Channel Setting Sensing Sensitivity: 0.5 mV
MDC IDC LEAD MODEL: 180
MDC IDC MSMT LEADCHNL RV IMPEDANCE VALUE: 340 Ohm
MDC IDC MSMT LEADCHNL RV SENSING INTR AMPL: 11.8 mV
MDC IDC SESS DTM: 20171125135052
MDC IDC SET LEADCHNL RV PACING AMPLITUDE: 2.5 V
Pulse Gen Serial Number: 1005776

## 2016-07-10 ENCOUNTER — Telehealth: Payer: Self-pay

## 2016-07-10 ENCOUNTER — Ambulatory Visit (INDEPENDENT_AMBULATORY_CARE_PROVIDER_SITE_OTHER): Payer: BLUE CROSS/BLUE SHIELD

## 2016-07-10 DIAGNOSIS — Z95810 Presence of automatic (implantable) cardiac defibrillator: Secondary | ICD-10-CM

## 2016-07-10 DIAGNOSIS — I5022 Chronic systolic (congestive) heart failure: Secondary | ICD-10-CM | POA: Diagnosis not present

## 2016-07-10 NOTE — Telephone Encounter (Signed)
Remote ICM transmission received.  Attempted patient call and left detailed message to return call regarding transmission  

## 2016-07-10 NOTE — Progress Notes (Signed)
EPIC Encounter for ICM Monitoring  Patient Name: George Barber is a 55 y.o. male Date: 07/10/2016 Primary Care Physican: Mechele Claude, MD Primary Cardiologist:Taylor Electrophysiologist: Fuller Mandril Weight:unknown      Attempted ICM call and unable to reach.  Left detailed message regarding transmission and to return call.  Transmission reviewed.   Thoracic impedance abnormal suggesting fluid accumulation from 12/14 and returned to normal today, 07/10/2016.  Recommendations:  NONE - unable to reach.  Provided ICM direct number.   Follow-up plan: ICM clinic phone appointment on 08/10/2016.  Copy of ICM check sent to device physician.   ICM trend: 07/10/2016       Karie Soda, RN 07/10/2016 3:07 PM

## 2016-07-10 NOTE — Telephone Encounter (Signed)
Attempted ICM call and left voice mail message requesting to send remote transmission today. Provided direct ICM number.

## 2016-08-11 NOTE — Progress Notes (Signed)
No ICM remote transmission received on 08/10/2016.   Next ICM transmission scheduled for 09/11/2016.

## 2016-09-11 ENCOUNTER — Ambulatory Visit (INDEPENDENT_AMBULATORY_CARE_PROVIDER_SITE_OTHER): Payer: BLUE CROSS/BLUE SHIELD | Admitting: *Deleted

## 2016-09-11 ENCOUNTER — Telehealth: Payer: Self-pay | Admitting: Cardiology

## 2016-09-11 DIAGNOSIS — I5022 Chronic systolic (congestive) heart failure: Secondary | ICD-10-CM | POA: Diagnosis not present

## 2016-09-11 DIAGNOSIS — Z95810 Presence of automatic (implantable) cardiac defibrillator: Secondary | ICD-10-CM | POA: Diagnosis not present

## 2016-09-11 DIAGNOSIS — I429 Cardiomyopathy, unspecified: Secondary | ICD-10-CM

## 2016-09-11 NOTE — Progress Notes (Signed)
Remote ICD transmission.   

## 2016-09-11 NOTE — Telephone Encounter (Signed)
LMOVM reminding pt to send remote transmission.   

## 2016-09-11 NOTE — Progress Notes (Signed)
EPIC Encounter for ICM Monitoring  Patient Name: George Barber is a 56 y.o. male Date: 09/11/2016 Primary Care Physican: Mechele Claude, MD Primary Cardiologist:Taylor Electrophysiologist: Fuller Mandril Weight:unknown       Attempted call to patient and unable to reach.  Left message to return call.  Transmission reviewed.    Thoracic impedance is above baseline suggesting dryness.    Current prescribed dose of Furosemide 20 mg 1 tablet daily.  Recommendations: NONE - Unable to reach patient   Follow-up plan: ICM clinic phone appointment on 10/12/2016.  Copy of ICM check sent to device physician.   3 month ICM trend: 09/11/2016   1 Year ICM trend:      Karie Soda, RN 09/11/2016 2:18 PM

## 2016-09-12 ENCOUNTER — Encounter: Payer: Self-pay | Admitting: Cardiology

## 2016-09-12 LAB — CUP PACEART REMOTE DEVICE CHECK
Brady Statistic RV Percent Paced: 1 % — CL
Date Time Interrogation Session: 20180220101613
HighPow Impedance: 82 Ohm
Implantable Lead Implant Date: 20120813
Implantable Lead Location: 753860
Implantable Lead Model: 180
Implantable Lead Serial Number: 303783
Implantable Pulse Generator Implant Date: 20120813
Lead Channel Impedance Value: 350 Ohm
Lead Channel Pacing Threshold Amplitude: 1.25 V
Lead Channel Pacing Threshold Pulse Width: 0.8 ms
Lead Channel Sensing Intrinsic Amplitude: 11.8 mV
Pulse Gen Serial Number: 1005776

## 2016-09-12 NOTE — Progress Notes (Signed)
Received call back from patient.  He is doing well and denied any fluid symptoms.  Reviewed transmission and it suggested he may have been a little dehydrated yesterday.  He said he drank more fluid yesterday because he was feeling thirsty.  Advised to drink up to 64 oz a day.  No changes today.  Advised he will need an appointment with Dr Ladona Ridgel in April.

## 2016-09-29 NOTE — Progress Notes (Signed)
ICM remote transmission rescheduled from 10/12/2016 to 10/16/2016.  

## 2016-10-16 ENCOUNTER — Ambulatory Visit (INDEPENDENT_AMBULATORY_CARE_PROVIDER_SITE_OTHER): Payer: BLUE CROSS/BLUE SHIELD

## 2016-10-16 DIAGNOSIS — Z95810 Presence of automatic (implantable) cardiac defibrillator: Secondary | ICD-10-CM

## 2016-10-16 DIAGNOSIS — I5022 Chronic systolic (congestive) heart failure: Secondary | ICD-10-CM

## 2016-10-22 ENCOUNTER — Other Ambulatory Visit: Payer: Self-pay | Admitting: Internal Medicine

## 2016-10-23 ENCOUNTER — Ambulatory Visit (INDEPENDENT_AMBULATORY_CARE_PROVIDER_SITE_OTHER): Payer: BLUE CROSS/BLUE SHIELD

## 2016-10-23 DIAGNOSIS — I5022 Chronic systolic (congestive) heart failure: Secondary | ICD-10-CM | POA: Diagnosis not present

## 2016-10-23 DIAGNOSIS — Z95810 Presence of automatic (implantable) cardiac defibrillator: Secondary | ICD-10-CM | POA: Diagnosis not present

## 2016-10-23 NOTE — Progress Notes (Signed)
EPIC Encounter for ICM Monitoring  Patient Name: George Barber is a 56 y.o. male Date: 10/23/2016 Primary Care Physican: Mechele Claude, MD Primary Cardiologist:Taylor Electrophysiologist: Fuller Mandril Weight:unknown      Attempted call to patient and unable to reach.  Left message to return call.  Transmission reviewed.    Thoracic impedance normal.  Current prescribed dose of Furosemide 20 mg 1 tablet daily.  Recommendations: NONE - Unable to reach patient   Follow-up plan: ICM clinic phone appointment on 12/15/2016 since he has a defib office check scheduled on 11/14/2016 with Dr Ladona Ridgel.  Copy of ICM check sent to device physician.   3 month ICM trend: 10/17/2016   1 Year ICM trend:      Karie Soda, RN 10/23/2016 2:42 PM

## 2016-10-23 NOTE — Progress Notes (Signed)
EPIC Encounter for ICM Monitoring  Patient Name: George Barber is a 56 y.o. male Date: 10/23/2016 Primary Care Physican: Mechele Claude, MD Primary Cardiologist:Taylor Electrophysiologist: Fuller Mandril Weight:unknown      Heart Failure questions reviewed, pt asymptomatic .   Thoracic impedance normal.  Current prescribed dose of Furosemide 20 mg 1 tablet daily.  Recommendations:  No changes. Reminded to limit dietary salt intake to 2000 mg/day and fluid intake to < 2 liters/day. Encouraged to call for fluid symptoms.  Follow-up plan: ICM clinic phone appointment on 12/15/2016.  Office appointment scheduled on 11/14/2016 with Dr Ladona Ridgel.  Copy of ICM check sent to device physician.   3 month ICM trend: 10/23/2016   1 Year ICM trend:      Karie Soda, RN 10/23/2016 4:38 PM

## 2016-10-27 ENCOUNTER — Encounter: Payer: Self-pay | Admitting: *Deleted

## 2016-11-14 ENCOUNTER — Encounter: Payer: Self-pay | Admitting: Internal Medicine

## 2016-11-14 ENCOUNTER — Ambulatory Visit (INDEPENDENT_AMBULATORY_CARE_PROVIDER_SITE_OTHER): Payer: BLUE CROSS/BLUE SHIELD | Admitting: Internal Medicine

## 2016-11-14 VITALS — BP 132/90 | HR 56 | Ht 69.0 in | Wt 148.6 lb

## 2016-11-14 DIAGNOSIS — I429 Cardiomyopathy, unspecified: Secondary | ICD-10-CM

## 2016-11-14 DIAGNOSIS — I5022 Chronic systolic (congestive) heart failure: Secondary | ICD-10-CM

## 2016-11-14 DIAGNOSIS — Z95810 Presence of automatic (implantable) cardiac defibrillator: Secondary | ICD-10-CM | POA: Diagnosis not present

## 2016-11-14 NOTE — Patient Instructions (Signed)
Medication Instructions:    Your physician recommends that you continue on your current medications as directed. Please refer to the Current Medication list given to you today.  --- If you need a refill on your cardiac medications before your next appointment, please call your pharmacy. ---  Labwork:  None ordered  Testing/Procedures:  None ordered  Follow-Up: Remote monitoring is used to monitor your Pacemaker of ICD from home. This monitoring reduces the number of office visits required to check your device to one time per year. It allows [place] to keep an eye on the functioning of your device to ensure it is working properly. You are scheduled for a device check from home on 02/13/2017. You may send your transmission at any time that day. If you have a wireless device, the transmission will be sent automatically. After your physician reviews your transmission, you will receive a postcard with your next transmission date.   Your physician wants you to follow-up in: 1 year with Dr. Ladona Ridgel. reminder letter in the mail two months in advance. If you don't receive a letter, please call our office to schedule the follow-up appointment.   Thank you for choosing [facility]!!

## 2016-11-14 NOTE — Progress Notes (Signed)
HPI Mr. Chapa returns today for followup. He is a very pleasant 56 year old man with a history of ventricular tachycardia, status post ICD insertion. He has a nonischemic cardiomyopathy with chronic systolic heart failure currently class II. In the interim, he has been stable. He denies chest pain or syncope. No peripheral edema. No ICD shocks. He notes that minimal exertion results in his getting sob. He admits to sodium indiscretion.  No Known Allergies   Current Outpatient Prescriptions  Medication Sig Dispense Refill  . albuterol (PROVENTIL HFA;VENTOLIN HFA) 108 (90 Base) MCG/ACT inhaler Inhale 2 puffs into the lungs every 6 (six) hours as needed for wheezing or shortness of breath. 1 Inhaler 0  . amiodarone (PACERONE) 200 MG tablet TAKE ONE TABLET BY MOUTH ONCE DAILY 30 tablet 11  . aspirin 81 MG tablet Take 81 mg by mouth daily.      . carvedilol (COREG) 12.5 MG tablet TAKE ONE TABLET BY MOUTH TWICE DAILY WITH MEALS 60 tablet 0  . furosemide (LASIX) 20 MG tablet TAKE ONE TABLET BY MOUTH ONCE DAILY 30 tablet 11  . losartan (COZAAR) 50 MG tablet TAKE ONE TABLET BY MOUTH ONCE DAILY 30 tablet 11  . spironolactone (ALDACTONE) 25 MG tablet TAKE ONE TABLET BY MOUTH ONCE DAILY 30 tablet 11  . umeclidinium-vilanterol (ANORO ELLIPTA) 62.5-25 MCG/INH AEPB One puff daily 1 each 11  . umeclidinium-vilanterol (ANORO ELLIPTA) 62.5-25 MCG/INH AEPB Inhale 1 puff into the lungs daily. 1 each 5   No current facility-administered medications for this visit.      Past Medical History:  Diagnosis Date  . Elevated LFTs   . Emphysema   . Fluttering heart   . NICM (nonischemic cardiomyopathy) (HCC)    cath 12/13/10: Normal cors, EF 10-15%;  b. echo 5/12 EF 15%, mild MR, mod LAE, mild RVE, mild to mod RAE, mild to mod TR, PASP 44  . NSVT (nonsustained ventricular tachycardia) (HCC)    Life Vest; amiodarone rx  . Systolic CHF, chronic (HCC)     ROS:   All systems reviewed and negative except as noted in  the HPI.   Past Surgical History:  Procedure Laterality Date  . 2D ECHOCARDIOGRAM  02/2011  . CARDIAC CATHETERIZATION       Family History  Problem Relation Age of Onset  . Cancer Mother 68  . Coronary artery disease Father     unknown  . Emphysema Father      Social History   Social History  . Marital status: Married    Spouse name: N/A  . Number of children: 1  . Years of education: N/A   Occupational History  . [employer]   Social History Main Topics  . Smoking status: Former Smoker    Packs/day: 1.00    Years: 15.00    Quit date: 10/23/2010  . Smokeless tobacco: Never Used  . Alcohol use No  . Drug use: No  . Sexual activity: Not on file   Other Topics Concern  . Not on file   Social History Narrative    Mr. Czaja is married and lives with his wife.  They have     1 child together.  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  He quit alcohol 1 month ago, after a 20-year history     of what he describes as social alcohol, but does endorse drinking 6-8     beers per day.  BP 132/90   Pulse (!) 56   Ht 5\' 9"  (1.753 m)   Wt 148 lb 9.6 oz (67.4 kg)   BMI 21.94 kg/m   Physical Exam:  Stable appearing middle-aged man, NAD HEENT: Unremarkable Neck:  6 cm JVD, no thyromegally Lungs:  Clear with no wheezes, rales, or rhonchi. HEART:  Regular rate rhythm, no murmurs, no rubs, no clicks Abd:  soft, positive bowel sounds, no organomegally, no rebound, no guarding Ext:  2 plus pulses, no edema, no cyanosis, no clubbing Skin:  No rashes no nodules Neuro:  CN II through XII intact, motor grossly intact  DEVICE  Normal device function.  See PaceArt for details.   Assess/Plan: 1. VT - he has had no additional ventricular arrhythmias. He will continue amio 2. Chronic systolic heart failure - his symptoms are class 2B. He has been instructed to increase his dose of lasix to 40 mg daily.  3. ICD - his St.  Jude device is working normally. Will recheck in several months.   Leonia Reeves.D.

## 2016-11-16 LAB — CUP PACEART INCLINIC DEVICE CHECK
Brady Statistic RV Percent Paced: 0.01 %
Date Time Interrogation Session: 20180424162542
HighPow Impedance: 70.875
Implantable Lead Model: 180
Lead Channel Impedance Value: 337.5 Ohm
Lead Channel Pacing Threshold Amplitude: 1.25 V
Lead Channel Pacing Threshold Amplitude: 1.25 V
Lead Channel Pacing Threshold Pulse Width: 0.8 ms
Lead Channel Setting Pacing Amplitude: 2.5 V
Lead Channel Setting Pacing Pulse Width: 0.8 ms
MDC IDC LEAD IMPLANT DT: 20120813
MDC IDC LEAD LOCATION: 753860
MDC IDC LEAD SERIAL: 303783
MDC IDC MSMT BATTERY REMAINING LONGEVITY: 51 mo
MDC IDC MSMT LEADCHNL RV PACING THRESHOLD PULSEWIDTH: 0.8 ms
MDC IDC MSMT LEADCHNL RV SENSING INTR AMPL: 11.8 mV
MDC IDC PG IMPLANT DT: 20120813
MDC IDC PG SERIAL: 1005776
MDC IDC SET LEADCHNL RV SENSING SENSITIVITY: 0.5 mV

## 2016-11-25 ENCOUNTER — Other Ambulatory Visit: Payer: Self-pay | Admitting: Internal Medicine

## 2016-11-30 ENCOUNTER — Other Ambulatory Visit: Payer: Self-pay | Admitting: Internal Medicine

## 2016-12-15 ENCOUNTER — Telehealth: Payer: Self-pay

## 2016-12-15 ENCOUNTER — Ambulatory Visit (INDEPENDENT_AMBULATORY_CARE_PROVIDER_SITE_OTHER): Payer: BLUE CROSS/BLUE SHIELD

## 2016-12-15 DIAGNOSIS — I5022 Chronic systolic (congestive) heart failure: Secondary | ICD-10-CM

## 2016-12-15 DIAGNOSIS — Z95810 Presence of automatic (implantable) cardiac defibrillator: Secondary | ICD-10-CM

## 2016-12-15 NOTE — Telephone Encounter (Signed)
Remote ICM transmission received.  Attempted patient call and left message to return call.   

## 2016-12-15 NOTE — Progress Notes (Signed)
EPIC Encounter for ICM Monitoring  Patient Name: George Barber is a 56 y.o. male Date: 12/15/2016 Primary Care Physican: Mechele Claude, MD Primary Cardiologist:Taylor Electrophysiologist: Fuller Mandril Weight:unknown       Attempted call to patient and unable to reach.  Left message to return call.  Transmission reviewed.    Thoracic impedance is normal today but has been abnormal suggesting fluid accumulation from 11/13/16 to 11/19/16, 11/30/16 to 12/03/16, 12/05/16 to 12/10/16 and 12/12/16 to 12/15/16.  Prescribed dose: Furosemide 20 mg 1 tablet daily.  Dr Bruna Potter last office note 11/16/2016 says patient to increase Lasix to 40 mg but med list was not updated. Will check with patient regarding dosage.    Recommendations: NONE - Unable to reach patient   Follow-up plan: ICM clinic phone appointment on 01/15/2017.    Copy of ICM check sent to device physician.   3 month ICM trend: 12/15/2016   1 Year ICM trend:      Karie Soda, RN 12/15/2016 1:31 PM

## 2017-01-15 ENCOUNTER — Ambulatory Visit (INDEPENDENT_AMBULATORY_CARE_PROVIDER_SITE_OTHER): Payer: BLUE CROSS/BLUE SHIELD

## 2017-01-15 DIAGNOSIS — Z95810 Presence of automatic (implantable) cardiac defibrillator: Secondary | ICD-10-CM

## 2017-01-15 DIAGNOSIS — I5022 Chronic systolic (congestive) heart failure: Secondary | ICD-10-CM

## 2017-01-15 NOTE — Progress Notes (Signed)
EPIC Encounter for ICM Monitoring  Patient Name: George Barber is a 56 y.o. male Date: 01/15/2017 Primary Care Physican: Mechele Claude, MD Primary Cardiologist:Taylor Electrophysiologist: Ladona Ridgel Dry Weight:147 lbs         Heart Failure questions reviewed, pt asymptomatic but has had a chest cold.     Thoracic impedance is at normal today but was abnormal suggesting fluid accumulation from 01/02/2017 to 01/04/2017 and 01/09/2017 to 01/14/2017.  Prescribed dose: Furosemide 20 mg 1 tablet daily. Confirmed dosage    Recommendations:  Does not follow low salt to 2000 mg/day and fluid intake to < 2 liters/day.  He uses the salt shaker a lot.  Advised 1 shake = 250 mg salt.  Encouraged him to try to make changes with diet. Encouraged to call for fluid symptoms.  Follow-up plan: ICM clinic phone appointment on 02/15/2017.    Copy of ICM check sent to device physician.   3 month ICM trend: 01/15/2017   1 Year ICM trend:      Karie Soda, RN 01/15/2017 9:26 AM

## 2017-02-15 ENCOUNTER — Ambulatory Visit (INDEPENDENT_AMBULATORY_CARE_PROVIDER_SITE_OTHER): Payer: BLUE CROSS/BLUE SHIELD | Admitting: *Deleted

## 2017-02-15 DIAGNOSIS — Z95810 Presence of automatic (implantable) cardiac defibrillator: Secondary | ICD-10-CM | POA: Diagnosis not present

## 2017-02-15 DIAGNOSIS — I429 Cardiomyopathy, unspecified: Secondary | ICD-10-CM

## 2017-02-15 DIAGNOSIS — I5022 Chronic systolic (congestive) heart failure: Secondary | ICD-10-CM | POA: Diagnosis not present

## 2017-02-15 NOTE — Progress Notes (Signed)
Remote ICD transmission.   

## 2017-02-16 ENCOUNTER — Encounter: Payer: Self-pay | Admitting: Cardiology

## 2017-02-16 NOTE — Progress Notes (Signed)
EPIC Encounter for ICM Monitoring  Patient Name: George Barber is a 56 y.o. male Date: 02/16/2017 Primary Care Physican: Mechele Claude, MD Primary Cardiologist:Taylor Electrophysiologist: Ladona Ridgel Dry Weight:147 lbs      Heart Failure questions reviewed, pt asymptomatic.   Thoracic impedance normal but was abnormal suggesting fluid accumulation days since 01/26/2017.  He denied any symptoms during decreased impedance  Prescribed dosage: Furosemide 20 mg 1 tablet daily.  Recommendations: No changes.  He reported decreasing salt intake and advised to continue to try and limit it tp 2000 mg daily.   Encouraged to call for fluid symptoms.  Follow-up plan: ICM clinic phone appointment on 03/19/2017.    Copy of ICM check sent to device physician.   3 month ICM trend: 02/16/2017   1 Year ICM trend:      Karie Soda, RN 02/16/2017 1:52 PM

## 2017-03-19 ENCOUNTER — Ambulatory Visit (INDEPENDENT_AMBULATORY_CARE_PROVIDER_SITE_OTHER): Payer: BLUE CROSS/BLUE SHIELD

## 2017-03-19 DIAGNOSIS — I5022 Chronic systolic (congestive) heart failure: Secondary | ICD-10-CM | POA: Diagnosis not present

## 2017-03-19 DIAGNOSIS — Z95810 Presence of automatic (implantable) cardiac defibrillator: Secondary | ICD-10-CM | POA: Diagnosis not present

## 2017-03-19 NOTE — Progress Notes (Signed)
EPIC Encounter for ICM Monitoring  Patient Name: George Barber is a 56 y.o. male Date: 03/19/2017 Primary Care Physican: Mechele Claude, MD Primary Cardiologist:Taylor Electrophysiologist: Ladona Ridgel Dry Weight:147 lbs      Heart Failure questions reviewed, pt asymptomatic.   Thoracic impedance normal.  No symptoms during time of decreased impedance.   Prescribed dosage: Furosemide 20 mg 1 tablet daily.  Recommendations: No changes.  Advise to limit salt intake. Encouraged to call for fluid symptoms.  Follow-up plan: ICM clinic phone appointment on 04/27/2017.    Copy of ICM check sent to Dr. Ladona Ridgel.   3 month ICM trend: 03/19/2017   1 Year ICM trend:      Karie Soda, RN 03/19/2017 11:34 AM

## 2017-03-21 LAB — CUP PACEART REMOTE DEVICE CHECK
Battery Remaining Longevity: 50 mo
Date Time Interrogation Session: 20180829091435
Implantable Lead Model: 180
Lead Channel Setting Pacing Amplitude: 2.5 V
Lead Channel Setting Pacing Pulse Width: 0.8 ms
Lead Channel Setting Sensing Sensitivity: 0.5 mV
MDC IDC LEAD IMPLANT DT: 20120813
MDC IDC LEAD LOCATION: 753860
MDC IDC LEAD SERIAL: 303783
MDC IDC PG IMPLANT DT: 20120813
Pulse Gen Serial Number: 1005776

## 2017-04-27 ENCOUNTER — Ambulatory Visit (INDEPENDENT_AMBULATORY_CARE_PROVIDER_SITE_OTHER): Payer: BLUE CROSS/BLUE SHIELD

## 2017-04-27 ENCOUNTER — Telehealth: Payer: Self-pay

## 2017-04-27 DIAGNOSIS — I5022 Chronic systolic (congestive) heart failure: Secondary | ICD-10-CM | POA: Diagnosis not present

## 2017-04-27 DIAGNOSIS — Z95810 Presence of automatic (implantable) cardiac defibrillator: Secondary | ICD-10-CM

## 2017-04-27 NOTE — Telephone Encounter (Signed)
Spoke with pt and reminded pt of remote transmission that is due today. Pt verbalized understanding.   

## 2017-04-27 NOTE — Progress Notes (Signed)
EPIC Encounter for ICM Monitoring  Patient Name: MUCAD MELONI is a 56 y.o. male Date: 04/27/2017 Primary Care Physican: Mechele Claude, MD Primary Cardiologist:Taylor Electrophysiologist: Ladona Ridgel Dry Weight:144 lbs      Heart Failure questions reviewed, pt asymptomatic.   Thoracic impedance normal with days of decreased impedance suggesting fluid accumulation.  Prescribed dosage: Furosemide 20 mg 1 tablet daily.  Recommendations: No changes.  Patient mows yards for a living and drinks more fluids on hot days which may contribute to the days with decreased impedance.   Encouraged to call for fluid symptoms.  Follow-up plan: ICM clinic phone appointment on 05/29/2017.    Copy of ICM check sent to Dr. Ladona Ridgel.   3 month ICM trend: 04/27/2017   1 Year ICM trend:      Karie Soda, RN 04/27/2017 11:50 AM

## 2017-05-29 ENCOUNTER — Telehealth: Payer: Self-pay

## 2017-05-29 ENCOUNTER — Ambulatory Visit (INDEPENDENT_AMBULATORY_CARE_PROVIDER_SITE_OTHER): Payer: BLUE CROSS/BLUE SHIELD | Admitting: *Deleted

## 2017-05-29 DIAGNOSIS — I5022 Chronic systolic (congestive) heart failure: Secondary | ICD-10-CM | POA: Diagnosis not present

## 2017-05-29 DIAGNOSIS — I429 Cardiomyopathy, unspecified: Secondary | ICD-10-CM

## 2017-05-29 DIAGNOSIS — Z95810 Presence of automatic (implantable) cardiac defibrillator: Secondary | ICD-10-CM

## 2017-05-29 NOTE — Telephone Encounter (Signed)
Remote ICM transmission received.  Attempted call to patient and left detailed message per DPR regarding transmission and next ICM scheduled for 07/02/2017.  Advised to return call for any fluid symptoms or questions.    

## 2017-05-29 NOTE — Progress Notes (Signed)
EPIC Encounter for ICM Monitoring  Patient Name: George Barber is a 56 y.o. male Date: 05/29/2017 Primary Care Physican: Mechele Claude, MD Primary Cardiologist:Taylor Electrophysiologist: Fuller Mandril Weight:Previous weight 144 lbs        Attempted call to patient and unable to reach.  Left detailed message regarding transmission.  Transmission reviewed.    Corvue: Thoracic impedance normal but was abnormal suggesting fluid accumulation from 04/30/2017 to 05/06/2017 and 05/11/2017 to 05/17/2017.  Prescribed dosage: Furosemide 20 mg 1 tablet daily.   Recommendations: Left voice mail with ICM number and encouraged to call if experiencing any fluid symptoms.  Follow-up plan: ICM clinic phone appointment on 07/02/2017.  Copy of ICM check sent to Dr. Ladona Ridgel.   3 month ICM trend: 05/29/2017    1 Year ICM trend:       Karie Soda, RN 05/29/2017 2:03 PM

## 2017-05-29 NOTE — Progress Notes (Signed)
Remote ICD transmission.   

## 2017-05-31 ENCOUNTER — Encounter: Payer: Self-pay | Admitting: Cardiology

## 2017-06-13 ENCOUNTER — Other Ambulatory Visit: Payer: Self-pay | Admitting: Nurse Practitioner

## 2017-06-13 ENCOUNTER — Other Ambulatory Visit: Payer: Self-pay | Admitting: Family Medicine

## 2017-06-13 MED ORDER — UMECLIDINIUM-VILANTEROL 62.5-25 MCG/INH IN AEPB: INHALATION_SPRAY | RESPIRATORY_TRACT | 0 refills | Status: DC

## 2017-06-13 NOTE — Telephone Encounter (Signed)
Patient aware that Rx has been sent to pharmacy and to keep follow up appt

## 2017-06-17 LAB — CUP PACEART REMOTE DEVICE CHECK
Implantable Lead Implant Date: 20120813
Implantable Lead Location: 753860
Implantable Lead Serial Number: 303783
Implantable Pulse Generator Implant Date: 20120813
MDC IDC SESS DTM: 20181125155201
MDC IDC SET LEADCHNL RV PACING AMPLITUDE: 2.5 V
MDC IDC SET LEADCHNL RV PACING PULSEWIDTH: 0.8 ms
MDC IDC SET LEADCHNL RV SENSING SENSITIVITY: 0.5 mV
Pulse Gen Serial Number: 1005776

## 2017-06-20 ENCOUNTER — Ambulatory Visit: Payer: BLUE CROSS/BLUE SHIELD | Admitting: Family Medicine

## 2017-06-27 ENCOUNTER — Encounter: Payer: Self-pay | Admitting: Nurse Practitioner

## 2017-06-27 ENCOUNTER — Ambulatory Visit: Payer: BLUE CROSS/BLUE SHIELD | Admitting: Nurse Practitioner

## 2017-06-27 VITALS — BP 134/80 | HR 113 | Temp 98.2°F | Ht 69.0 in | Wt 139.0 lb

## 2017-06-27 DIAGNOSIS — J441 Chronic obstructive pulmonary disease with (acute) exacerbation: Secondary | ICD-10-CM | POA: Diagnosis not present

## 2017-06-27 DIAGNOSIS — H6122 Impacted cerumen, left ear: Secondary | ICD-10-CM | POA: Diagnosis not present

## 2017-06-27 DIAGNOSIS — J069 Acute upper respiratory infection, unspecified: Secondary | ICD-10-CM | POA: Diagnosis not present

## 2017-06-27 MED ORDER — UMECLIDINIUM-VILANTEROL 62.5-25 MCG/INH IN AEPB: INHALATION_SPRAY | RESPIRATORY_TRACT | 5 refills | Status: DC

## 2017-06-27 MED ORDER — AZITHROMYCIN 250 MG PO TABS: ORAL_TABLET | ORAL | 0 refills | Status: DC

## 2017-06-27 MED ORDER — ALBUTEROL SULFATE HFA 108 (90 BASE) MCG/ACT IN AERS: 2.0000 | INHALATION_SPRAY | Freq: Four times a day (QID) | RESPIRATORY_TRACT | 1 refills | Status: DC | PRN

## 2017-06-27 NOTE — Patient Instructions (Signed)

## 2017-06-27 NOTE — Progress Notes (Signed)
   Subjective:    Patient ID: [name], male    DOB: 25-Jun-1961, 56 y.o.   MRN: [id number]  HPI Patient comes in today with c/o congestion, ear stopped up and cough. He has been sick for over a week. No fever. He has chronic COPD and uses albuterol at least 2-3 x a week.     Review of Systems  Constitutional: Negative for appetite change, chills and fatigue.  HENT: Positive for congestion and ear pain. Negative for sore throat and trouble swallowing.   Respiratory: Positive for cough.   Cardiovascular: Negative.   Gastrointestinal: Negative.   Genitourinary: Negative.   Neurological: Negative.   Psychiatric/Behavioral: Negative.   All other systems reviewed and are negative.      Objective:   Physical Exam  Constitutional: He is oriented to person, place, and time. He appears well-developed and well-nourished. No distress.  HENT:  Right Ear: Hearing, tympanic membrane, external ear and ear canal normal.  Left Ear: Hearing, tympanic membrane and external ear normal. A foreign body (cerumen impaction) is present.  Nose: Mucosal edema and rhinorrhea present. Right sinus exhibits no maxillary sinus tenderness and no frontal sinus tenderness. Left sinus exhibits no maxillary sinus tenderness and no frontal sinus tenderness.  Mouth/Throat: Uvula is midline, oropharynx is clear and moist and mucous membranes are normal.  Cardiovascular: Normal rate and regular rhythm.  Pulmonary/Chest: Effort normal. He has no wheezes.  Diminished breath sounds throughout  Neurological: He is alert and oriented to person, place, and time.  Skin: Skin is warm.  Psychiatric: He has a normal mood and affect. His behavior is normal. Judgment and thought content normal.    BP 134/80   Pulse (!) 113   Temp 98.2 F (36.8 C) (Oral)   Ht 5\' 9"  (1.753 m)   Wt 139 lb (63 kg)   SpO2 93%   BMI 20.53 kg/m   S/p ear lavage and curretting- TM left normal     Assessment & Plan:  1. COPD exacerbation  (HCC) Avoid smoking - umeclidinium-vilanterol (ANORO ELLIPTA) 62.5-25 MCG/INH AEPB; One puff daily  Dispense: 1 each; Refill: 5 - albuterol (PROVENTIL HFA;VENTOLIN HFA) 108 (90 Base) MCG/ACT inhaler; Inhale 2 puffs into the lungs every 6 (six) hours as needed for wheezing or shortness of breath.  Dispense: 1 Inhaler; Refill: 1  2. Upper respiratory tract infection, unspecified type 1. Take meds as prescribed 2. Use a cool mist humidifier especially during the winter months and when heat has been humid. 3. Use saline nose sprays frequently 4. Saline irrigations of the nose can be very helpful if done frequently.  * 4X daily for 1 week*  * Use of a nettie pot can be helpful with this. Follow directions with this* 5. Drink plenty of fluids 6. Keep thermostat turn down low 7.For any cough or congestion  Use plain Mucinex- regular strength or max strength is fine   * Children- consult with Pharmacist for dosing 8. For fever or aces or pains- take tylenol or ibuprofen appropriate for age and weight.  * for fevers greater than 101 orally you may alternate ibuprofen and tylenol every  3 hours.    - azithromycin (ZITHROMAX Z-PAK) 250 MG tablet; As directed  Dispense: 6 tablet; Refill: 0  3. Impacted cerumen of left ear Debrox 2-3x a week  Mary-Margaret Daphine Deutscher, FNP

## 2017-07-02 ENCOUNTER — Ambulatory Visit (INDEPENDENT_AMBULATORY_CARE_PROVIDER_SITE_OTHER): Payer: BLUE CROSS/BLUE SHIELD

## 2017-07-02 DIAGNOSIS — Z95810 Presence of automatic (implantable) cardiac defibrillator: Secondary | ICD-10-CM | POA: Diagnosis not present

## 2017-07-02 DIAGNOSIS — I5022 Chronic systolic (congestive) heart failure: Secondary | ICD-10-CM

## 2017-07-06 NOTE — Progress Notes (Signed)
EPIC Encounter for ICM Monitoring  Patient Name: George Barber is a 56 y.o. male Date: 07/06/2017 Primary Care Physican: Bennie Pierini, FNP Primary Cardiologist:Taylor Electrophysiologist: Ladona Ridgel Dry Weight:Previous weight 144lbs                                                Transmission reviewed.    Corvue: Thoracic impedance normal.  Prescribed dosage: Furosemide 20 mg 1 tablet daily.  Recommendations: None  Follow-up plan: ICM clinic phone appointment on 07/02/2017.  Copy of ICM check sent to Dr. Ladona Ridgel.   3 month ICM trend: 07/02/2017    1 Year ICM trend:       Karie Soda, RN 07/06/2017 2:42 PM

## 2017-08-14 NOTE — Progress Notes (Signed)
No ICM remote transmission received for 07/30/2017 and next ICM transmission scheduled for 08/28/2017.

## 2017-08-28 ENCOUNTER — Ambulatory Visit (INDEPENDENT_AMBULATORY_CARE_PROVIDER_SITE_OTHER): Payer: BLUE CROSS/BLUE SHIELD | Admitting: *Deleted

## 2017-08-28 ENCOUNTER — Telehealth: Payer: Self-pay | Admitting: Cardiology

## 2017-08-28 DIAGNOSIS — I429 Cardiomyopathy, unspecified: Secondary | ICD-10-CM

## 2017-08-28 DIAGNOSIS — Z95810 Presence of automatic (implantable) cardiac defibrillator: Secondary | ICD-10-CM

## 2017-08-28 DIAGNOSIS — I5022 Chronic systolic (congestive) heart failure: Secondary | ICD-10-CM | POA: Diagnosis not present

## 2017-08-28 NOTE — Progress Notes (Signed)
EPIC Encounter for ICM Monitoring  Patient Name: George Barber is a 57 y.o. male Date: 08/28/2017 Primary Care Physican: Bennie Pierini, FNP Primary Cardiologist:Taylor Electrophysiologist: Ladona Ridgel Dry Weight:147lbs      Heart Failure questions reviewed, pt asymptomatic.  Patient eats a lot salt. He drinks V8 juice very often.  Advised to limit salt to 2000 mg daily and encouraged him to at least try the low sodium V8 juice.  He adds salt to V8 juice and beer.   Thoracic impedance slightly below baseline suggesting fluid accumulation starting 2/4.  Prescribed dosage: Furosemide 20 mg 1 tablet daily.  Recommendations: No changes.    Encouraged to call for fluid symptoms.  Follow-up plan: ICM clinic phone appointment on 09/28/2017.   Advised he is due to for yearly office visit in April with Dr Ladona Ridgel and will have scheduler call him.  Last BMET 2017.  Copy of ICM check sent to Dr. Ladona Ridgel.   3 month ICM trend: 08/28/2017    1 Year ICM trend:       Karie Soda, RN 08/28/2017 4:05 PM

## 2017-08-28 NOTE — Telephone Encounter (Signed)
LMOVM reminding pt to send remote transmission.   

## 2017-08-29 ENCOUNTER — Encounter: Payer: Self-pay | Admitting: Cardiology

## 2017-08-29 NOTE — Progress Notes (Signed)
Remote ICD transmission.   

## 2017-09-24 LAB — CUP PACEART REMOTE DEVICE CHECK
Date Time Interrogation Session: 20190304123345
Implantable Lead Implant Date: 20120813
Implantable Lead Location: 753860
Implantable Lead Serial Number: 303783
Implantable Pulse Generator Implant Date: 20120813
Lead Channel Setting Pacing Amplitude: 2.5 V
Lead Channel Setting Pacing Pulse Width: 0.8 ms
MDC IDC PG SERIAL: 1005776
MDC IDC SET LEADCHNL RV SENSING SENSITIVITY: 0.5 mV

## 2017-09-28 ENCOUNTER — Telehealth: Payer: Self-pay

## 2017-09-28 ENCOUNTER — Ambulatory Visit (INDEPENDENT_AMBULATORY_CARE_PROVIDER_SITE_OTHER): Payer: BLUE CROSS/BLUE SHIELD

## 2017-09-28 DIAGNOSIS — Z95810 Presence of automatic (implantable) cardiac defibrillator: Secondary | ICD-10-CM

## 2017-09-28 DIAGNOSIS — I5022 Chronic systolic (congestive) heart failure: Secondary | ICD-10-CM

## 2017-09-28 NOTE — Progress Notes (Signed)
EPIC Encounter for ICM Monitoring  Patient Name: George Barber is a 57 y.o. male Date: 09/28/2017 Primary Care Physican: Bennie Pierini, FNP Primary Cardiologist:Taylor Electrophysiologist: Ladona Ridgel Dry Weight: Previous weight 147lbs       Attempted call to patient and unable to reach.  Left message to return call.  Transmission reviewed.      Thoracic impedance normal.  Prescribed dosage: Furosemide 20 mg 1 tablet daily.  Recommendations: NONE - Unable to reach.  Follow-up plan: ICM clinic phone appointment on 12/03/2017.  Office appointment scheduled 10/30/2017 with Dr. Ladona Ridgel.  Copy of ICM check sent to Dr. Ladona Ridgel.   3 month ICM trend: 09/28/2017    1 Year ICM trend:       Karie Soda, RN 09/28/2017 12:11 PM

## 2017-09-28 NOTE — Telephone Encounter (Signed)
Remote ICM transmission received.  Attempted call to patient and left message to return call. 

## 2017-10-04 ENCOUNTER — Other Ambulatory Visit: Payer: Self-pay

## 2017-10-04 ENCOUNTER — Telehealth: Payer: Self-pay | Admitting: Internal Medicine

## 2017-10-04 MED ORDER — LOSARTAN POTASSIUM 100 MG PO TABS: ORAL_TABLET | ORAL | 3 refills | Status: DC

## 2017-10-04 NOTE — Telephone Encounter (Signed)
NEW MESSAGE    Pt c/o medication issue:  1. Name of Medication: losartan (COZAAR) 50 MG tablet  2. How are you currently taking this medication (dosage and times per day)? AS PRESCRIBED  3. Are you having a reaction (difficulty breathing--STAT)? NO  4. What is your medication issue? MEDICATION NOT AVAIL, CAN PATIENT BE PRESCRIBED 100MG  , TAKING 1/2 TAB

## 2017-10-04 NOTE — Telephone Encounter (Signed)
Spoke with pharmacist who informed me that the patient's insurance will not cover the 50 mg tablets, so could we prescribe the 100 mg tablets and have him take 1/2 a tablet..   I informed them that it is fine and they verbalized understanding.. I also informed patient.[name] Kitchen

## 2017-10-09 ENCOUNTER — Other Ambulatory Visit: Payer: Self-pay

## 2017-10-30 ENCOUNTER — Encounter: Payer: Self-pay | Admitting: Internal Medicine

## 2017-10-30 ENCOUNTER — Ambulatory Visit: Payer: BLUE CROSS/BLUE SHIELD | Admitting: Internal Medicine

## 2017-10-30 VITALS — BP 138/82 | HR 65 | Ht 70.0 in | Wt 141.4 lb

## 2017-10-30 DIAGNOSIS — I4729 Other ventricular tachycardia: Secondary | ICD-10-CM

## 2017-10-30 DIAGNOSIS — I5022 Chronic systolic (congestive) heart failure: Secondary | ICD-10-CM | POA: Diagnosis not present

## 2017-10-30 DIAGNOSIS — Z95810 Presence of automatic (implantable) cardiac defibrillator: Secondary | ICD-10-CM | POA: Diagnosis not present

## 2017-10-30 DIAGNOSIS — I472 Ventricular tachycardia: Secondary | ICD-10-CM

## 2017-10-30 LAB — CUP PACEART INCLINIC DEVICE CHECK
Brady Statistic RV Percent Paced: 0 %
Date Time Interrogation Session: 20190409103427
Implantable Lead Location: 753860
Implantable Lead Model: 180
Implantable Lead Serial Number: 303783
Lead Channel Setting Pacing Amplitude: 2.5 V
MDC IDC LEAD IMPLANT DT: 20120813
MDC IDC MSMT BATTERY REMAINING LONGEVITY: 50 mo
MDC IDC MSMT LEADCHNL RV SENSING INTR AMPL: 11.8 mV
MDC IDC PG IMPLANT DT: 20120813
MDC IDC SET LEADCHNL RV PACING PULSEWIDTH: 0.8 ms
MDC IDC SET LEADCHNL RV SENSING SENSITIVITY: 0.5 mV
Pulse Gen Serial Number: 1005776

## 2017-10-30 NOTE — Progress Notes (Signed)
HPI George Barber returns today for followup. He is a very pleasant 57 year old man with a history of ventricular tachycardia, status post ICD insertion. He has a nonischemic cardiomyopathy with chronic systolic heart failure currently class II. In the interim, he has been stable. He denies chest pain or syncope. No peripheral edema. No ICD shocks. He notes that minimal exertion results in his getting sob. He admits to sodium indiscretion but is trying to do better.  No Known Allergies   Current Outpatient Medications  Medication Sig Dispense Refill  . albuterol (PROVENTIL HFA;VENTOLIN HFA) 108 (90 Base) MCG/ACT inhaler Inhale 2 puffs into the lungs every 6 (six) hours as needed for wheezing or shortness of breath. 1 Inhaler 1  . amiodarone (PACERONE) 200 MG tablet TAKE ONE TABLET BY MOUTH ONCE DAILY 30 tablet 11  . aspirin 81 MG tablet Take 81 mg by mouth daily.      . carvedilol (COREG) 12.5 MG tablet TAKE 1 TABLET BY MOUTH TWICE DAILY WITH MEALS 180 tablet 3  . furosemide (LASIX) 20 MG tablet TAKE ONE TABLET BY MOUTH ONCE DAILY 30 tablet 11  . losartan (COZAAR) 100 MG tablet Take one half pill (50 mg) daily by mouth 90 tablet 3  . spironolactone (ALDACTONE) 25 MG tablet TAKE ONE TABLET BY MOUTH ONCE DAILY 30 tablet 11  . umeclidinium-vilanterol (ANORO ELLIPTA) 62.5-25 MCG/INH AEPB Inhale 1 puff into the lungs daily.     No current facility-administered medications for this visit.      Past Medical History:  Diagnosis Date  . Elevated LFTs   . Emphysema   . Fluttering heart   . NICM (nonischemic cardiomyopathy) (HCC)    cath 12/13/10: Normal cors, EF 10-15%;  b. echo 5/12 EF 15%, mild MR, mod LAE, mild RVE, mild to mod RAE, mild to mod TR, PASP 44  . NSVT (nonsustained ventricular tachycardia) (HCC)    Life Vest; amiodarone rx  . Systolic CHF, chronic (HCC)     ROS:   All systems reviewed and negative except as noted in the HPI.   Past Surgical History:  Procedure  Laterality Date  . 2D ECHOCARDIOGRAM  02/2011  . CARDIAC CATHETERIZATION       Family History  Problem Relation Age of Onset  . Cancer Mother 2  . Coronary artery disease Father        unknown  . Emphysema Father      Social History   Socioeconomic History  . Marital status: Married    Spouse name: Not on file  . Number of children: 1  . Years of education: Not on file  . Highest education level: Not on file  Occupational History  . Occupation: ELECTRICAL    Employer: [employer]  Social Needs  . Financial resource strain: Not on file  . Food insecurity:    Worry: Not on file    Inability: Not on file  . Transportation needs:    Medical: Not on file    Non-medical: Not on file  Tobacco Use  . Smoking status: Former Smoker    Packs/day: 1.00    Years: 15.00    Pack years: 15.00    Last attempt to quit: 10/23/2010    Years since quitting: 7.0  . Smokeless tobacco: Never Used  Substance and Sexual Activity  . Alcohol use: No  . Drug use: No  . Sexual activity: Not on file  Lifestyle  . Physical activity:    Days per  week: Not on file    Minutes per session: Not on file  . Stress: Not on file  Relationships  . Social connections:    Talks on phone: Not on file    Gets together: Not on file    Attends religious service: Not on file    Active member of club or organization: Not on file    Attends meetings of clubs or organizations: Not on file    Relationship status: Not on file  . Intimate partner violence:    Fear of current or ex partner: Not on file    Emotionally abused: Not on file    Physically abused: Not on file    Forced sexual activity: Not on file  Other Topics Concern  . Not on file  Social History Narrative    Mr. Gochanour is married and lives with his wife.  They have     1 child together.  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  He quit alcohol 1 month ago, after a 20-year history     of what he describes  as social alcohol, but does endorse drinking 6-8     beers per day.           BP 138/82   Pulse 65   Ht 5\' 10"  (1.778 m)   Wt 141 lb 6.4 oz (64.1 kg)   SpO2 98%   BMI 20.29 kg/m   Physical Exam:  Well appearing 57 yo man, NAD HEENT: Unremarkable Neck:  6 cm JVD, no thyromegally Lymphatics:  No adenopathy Back:  No CVA tenderness Lungs:  Clear with no wheezes HEART:  Regular rate rhythm, no murmurs, no rubs, no clicks Abd:  soft, positive bowel sounds, no organomegally, no rebound, no guarding Ext:  2 plus pulses, no edema, no cyanosis, no clubbing Skin:  No rashes no nodules Neuro:  CN II through XII intact, motor grossly intact  EKG - NSR with a septal MI  DEVICE  Normal device function.  See PaceArt for details.   Assess/Plan: 1. Chronic systolic heart failure - his symptoms remain class 2. He is still working and I have encouraged him to continue his current meds, maintain a low sodium diet and stay active. 2. ICD - his St. Jude ICD is working normally. Will recheck in several months. 3. VT - he has had no recurrent episodes of VT. No change in meds.  Leonia Reeves.D.

## 2017-10-30 NOTE — Patient Instructions (Signed)
Medication Instructions:  Your physician recommends that you continue on your current medications as directed. Please refer to the Current Medication list given to you today.  Labwork: None ordered.  Testing/Procedures: None ordered.  Follow-Up: Your physician wants you to follow-up in: one year with Dr. Ladona Ridgel.   You will receive a reminder letter in the mail two months in advance. If you don't receive a letter, please call our office to schedule the follow-up appointment.  Remote monitoring is used to monitor your ICD from home. This monitoring reduces the number of office visits required to check your device to one time per year. It allows [place] to keep an eye on the functioning of your device to ensure it is working properly. You are scheduled for a device check from home on 12/03/2017. You may send your transmission at any time that day. If you have a wireless device, the transmission will be sent automatically. After your physician reviews your transmission, you will receive a postcard with your next transmission date.  Any Other Special Instructions Will Be Listed Below (If Applicable).  If you need a refill on your cardiac medications before your next appointment, please call your pharmacy.

## 2017-12-03 ENCOUNTER — Telehealth: Payer: Self-pay | Admitting: Cardiology

## 2017-12-03 ENCOUNTER — Ambulatory Visit (INDEPENDENT_AMBULATORY_CARE_PROVIDER_SITE_OTHER): Payer: BLUE CROSS/BLUE SHIELD | Admitting: *Deleted

## 2017-12-03 DIAGNOSIS — I5022 Chronic systolic (congestive) heart failure: Secondary | ICD-10-CM

## 2017-12-03 DIAGNOSIS — Z95810 Presence of automatic (implantable) cardiac defibrillator: Secondary | ICD-10-CM

## 2017-12-03 DIAGNOSIS — I429 Cardiomyopathy, unspecified: Secondary | ICD-10-CM

## 2017-12-03 NOTE — Telephone Encounter (Signed)
LMOVM reminding pt to send remote transmission.   

## 2017-12-04 NOTE — Progress Notes (Signed)
Remote ICD transmission.   

## 2017-12-04 NOTE — Progress Notes (Signed)
EPIC Encounter for ICM Monitoring  Patient Name: George Barber is a 57 y.o. male Date: 12/04/2017 Primary Care Physican: Bennie Pierini, FNP Primary Cardiologist:Taylor Electrophysiologist: Ladona Ridgel Dry Weight: Previous weight 147lbs         Attempted call to patient and unable to reach.  Left detailed message, per DPR, regarding transmission.  Transmission reviewed.    Thoracic impedance normal but was abnormal suggesting fluid accumulation from 11/26/2017 - 11/29/2017.  Prescribed dosage: Furosemide 20 mg 1 tablet daily  Recommendations: Left voice mail with ICM number and encouraged to call if experiencing any fluid symptoms.  Follow-up plan: ICM clinic phone appointment on 01/03/2018.    Copy of ICM check sent to Dr. Ladona Ridgel.   3 month ICM trend: 12/03/2017    1 Year ICM trend:       Karie Soda, RN 12/04/2017 1:59 PM

## 2017-12-05 ENCOUNTER — Encounter: Payer: Self-pay | Admitting: Cardiology

## 2017-12-06 LAB — CUP PACEART REMOTE DEVICE CHECK
Battery Remaining Longevity: 42 mo
Battery Remaining Percentage: 45 %
Brady Statistic RV Percent Paced: 1 % — CL
HighPow Impedance: 71 Ohm
Implantable Lead Implant Date: 20120813
Implantable Lead Serial Number: 303783
Implantable Pulse Generator Implant Date: 20120813
Lead Channel Impedance Value: 310 Ohm
Lead Channel Sensing Intrinsic Amplitude: 11.8 mV
Lead Channel Setting Pacing Amplitude: 2.5 V
Lead Channel Setting Pacing Pulse Width: 0.8 ms
Lead Channel Setting Sensing Sensitivity: 0.5 mV
MDC IDC LEAD LOCATION: 753860
MDC IDC SESS DTM: 20190516084504
Pulse Gen Serial Number: 1005776

## 2017-12-09 ENCOUNTER — Other Ambulatory Visit: Payer: Self-pay | Admitting: Internal Medicine

## 2018-01-03 ENCOUNTER — Ambulatory Visit (INDEPENDENT_AMBULATORY_CARE_PROVIDER_SITE_OTHER): Payer: BLUE CROSS/BLUE SHIELD

## 2018-01-03 ENCOUNTER — Telehealth: Payer: Self-pay | Admitting: Cardiology

## 2018-01-03 DIAGNOSIS — I5022 Chronic systolic (congestive) heart failure: Secondary | ICD-10-CM

## 2018-01-03 DIAGNOSIS — Z95810 Presence of automatic (implantable) cardiac defibrillator: Secondary | ICD-10-CM

## 2018-01-03 NOTE — Progress Notes (Signed)
EPIC Encounter for ICM Monitoring  Patient Name: George Barber is a 57 y.o. male Date: 01/03/2018 Primary Care Physican: Bennie Pierini, FNP Primary Cardiologist:Taylor Electrophysiologist: Ladona Ridgel Dry Weight: 147lbs        Heart Failure questions reviewed, pt asymptomatic.  Does not follow low salt diet and drinks V8 juice freqently.  He does try to drink low sodium V8.   Thoracic impedance normal but was abnormal suggesting fluid accumulation from 12/24/2017 - 01/01/2018.  Prescribed dosage: Furosemide 20 mg 1 tablet daily  Recommendations: Encouraged to restrict salt intake and explained too much salt makes the heart and kidneys work harder to get rid of the fluid.  Encouraged to call for fluid symptoms.  Follow-up plan: ICM clinic phone appointment on 02/04/2018.    Copy of ICM check sent to Dr. Ladona Ridgel.   3 month ICM trend: 01/03/2018    1 Year ICM trend:       Karie Soda, RN 01/03/2018 4:21 PM

## 2018-01-03 NOTE — Telephone Encounter (Signed)
LMOVM reminding pt to send remote transmission.   

## 2018-01-17 ENCOUNTER — Other Ambulatory Visit: Payer: Self-pay | Admitting: Internal Medicine

## 2018-02-04 ENCOUNTER — Ambulatory Visit (INDEPENDENT_AMBULATORY_CARE_PROVIDER_SITE_OTHER): Payer: BLUE CROSS/BLUE SHIELD

## 2018-02-04 DIAGNOSIS — I5022 Chronic systolic (congestive) heart failure: Secondary | ICD-10-CM

## 2018-02-04 DIAGNOSIS — Z95810 Presence of automatic (implantable) cardiac defibrillator: Secondary | ICD-10-CM

## 2018-02-04 NOTE — Progress Notes (Signed)
EPIC Encounter for ICM Monitoring  Patient Name: George Barber is a 57 y.o. male Date: 02/04/2018 Primary Care Physican: Bennie Pierini, FNP Primary Cardiologist:Taylor Electrophysiologist: Ladona Ridgel Dry Weight:Previous [id number]      Attempted call to patient and unable to reach.  Left detailed message, per DPR, regarding transmission.  Transmission reviewed.    Thoracic impedance normal.   Prescribed dosage: Furosemide 20 mg 1 tablet daily  Recommendations: Left voice mail with ICM number and encouraged to call if experiencing any fluid symptoms.  Follow-up plan: ICM clinic phone appointment on 03/07/2018.     Copy of ICM check sent to Dr. Ladona Ridgel.   3 month ICM trend: 02/04/2018    1 Year ICM trend:       Karie Soda, RN 02/04/2018 10:14 AM

## 2018-02-05 ENCOUNTER — Telehealth: Payer: Self-pay

## 2018-02-05 NOTE — Telephone Encounter (Signed)
Remote ICM transmission received.  Attempted call to patient and left detailed message, per DPR, regarding transmission and next ICM scheduled for 03/07/2018.  Advised to return call for any fluid symptoms or questions.    

## 2018-03-07 ENCOUNTER — Telehealth: Payer: Self-pay | Admitting: Cardiology

## 2018-03-07 ENCOUNTER — Ambulatory Visit (INDEPENDENT_AMBULATORY_CARE_PROVIDER_SITE_OTHER): Payer: Self-pay

## 2018-03-07 ENCOUNTER — Ambulatory Visit (INDEPENDENT_AMBULATORY_CARE_PROVIDER_SITE_OTHER): Payer: Self-pay | Admitting: *Deleted

## 2018-03-07 DIAGNOSIS — I472 Ventricular tachycardia: Secondary | ICD-10-CM

## 2018-03-07 DIAGNOSIS — Z95810 Presence of automatic (implantable) cardiac defibrillator: Secondary | ICD-10-CM

## 2018-03-07 DIAGNOSIS — I5022 Chronic systolic (congestive) heart failure: Secondary | ICD-10-CM

## 2018-03-07 DIAGNOSIS — I429 Cardiomyopathy, unspecified: Secondary | ICD-10-CM

## 2018-03-07 DIAGNOSIS — I4729 Other ventricular tachycardia: Secondary | ICD-10-CM

## 2018-03-07 NOTE — Progress Notes (Signed)
Remote ICD transmission.   

## 2018-03-07 NOTE — Telephone Encounter (Signed)
LMOVM reminding pt to send remote transmission.   

## 2018-03-07 NOTE — Progress Notes (Signed)
EPIC Encounter for ICM Monitoring  Patient Name: George Barber is a 57 y.o. male Date: 03/07/2018 Primary Care Physican: Bennie Pierini, FNP Primary Cardiologist:Taylor Electrophysiologist: Ladona Ridgel Dry Weight:Previous [id number]      Attempted call to patient and unable to reach.   Transmission reviewed.    Thoracic impedance normal.  Prescribed dosage: Furosemide 20 mg 1 tablet daily  Recommendations: NONE - Unable to reach.  Follow-up plan: ICM clinic phone appointment on 04/08/2018.    Copy of ICM check sent to Dr. Ladona Ridgel.   3 month ICM trend: 03/07/2018    1 Year ICM trend:       Karie Soda, RN 03/07/2018 5:19 PM

## 2018-04-08 ENCOUNTER — Ambulatory Visit (INDEPENDENT_AMBULATORY_CARE_PROVIDER_SITE_OTHER): Payer: Self-pay

## 2018-04-08 DIAGNOSIS — Z95810 Presence of automatic (implantable) cardiac defibrillator: Secondary | ICD-10-CM

## 2018-04-08 DIAGNOSIS — I5022 Chronic systolic (congestive) heart failure: Secondary | ICD-10-CM

## 2018-04-09 NOTE — Progress Notes (Signed)
EPIC Encounter for ICM Monitoring  Patient Name: George Barber is a 58 y.o. male Date: 04/09/2018 Primary Care Physican: Bennie Pierini, FNP Primary Cardiologist:Taylor Electrophysiologist: Ladona Ridgel Dry Weight:147lbs      Heart Failure questions reviewed, pt asymptomatic.   Thoracic impedance normal.  Decreased impedance dates suggesting fluid accumulation from 04/01/2018 - 04/05/2018.  Prescribed: Furosemide 20 mg 1 tablet daily  Recommendations: No changes.   Encouraged to call for fluid symptoms.  Follow-up plan: ICM clinic phone appointment on 05/09/2018.    Copy of ICM check sent to Dr. Ladona Ridgel.   3 month ICM trend: 04/08/2018    1 Year ICM trend:       Karie Soda, RN 04/09/2018 10:36 AM

## 2018-04-15 LAB — CUP PACEART REMOTE DEVICE CHECK
Implantable Lead Location: 753860
Implantable Lead Model: 180
Implantable Lead Serial Number: 303783
Implantable Pulse Generator Implant Date: 20120813
MDC IDC LEAD IMPLANT DT: 20120813
MDC IDC SESS DTM: 20190923074557
Pulse Gen Serial Number: 1005776

## 2018-05-09 ENCOUNTER — Ambulatory Visit (INDEPENDENT_AMBULATORY_CARE_PROVIDER_SITE_OTHER): Payer: Self-pay

## 2018-05-09 DIAGNOSIS — Z95810 Presence of automatic (implantable) cardiac defibrillator: Secondary | ICD-10-CM

## 2018-05-09 DIAGNOSIS — I5022 Chronic systolic (congestive) heart failure: Secondary | ICD-10-CM

## 2018-05-10 ENCOUNTER — Telehealth: Payer: Self-pay

## 2018-05-10 NOTE — Progress Notes (Signed)
EPIC Encounter for ICM Monitoring  Patient Name: George Barber is a 57 y.o. male Date: 05/10/2018 Primary Care Physican: Bennie Pierini, FNP Primary Cardiologist:Taylor Electrophysiologist: Ladona Ridgel Dry Weight:Previous weight 147lbs         Attempted call to patient and unable to reach.  Left detailed message, per DPR, regarding transmission.  Transmission reviewed.    Thoracic impedance normal but was abnormal suggesting fluid accumulation from 04/28/2018 - 05/08/2018.   Prescribed: Furosemide 20 mg 1 tablet daily  Recommendations: Left voice mail with ICM number and encouraged to call if experiencing any fluid symptoms.  Follow-up plan: ICM clinic phone appointment on 06/10/2018.    Copy of ICM check sent to Dr. Ladona Ridgel.   3 month ICM trend: 05/10/2018    1 Year ICM trend:       Karie Soda, RN 05/10/2018 7:58 AM

## 2018-05-10 NOTE — Telephone Encounter (Signed)
Remote ICM transmission received.  Attempted call to patient regarding ICM remote transmission and left detailed message, per DPR, with next ICM remote transmission date of 06/10/2018.  Advised to return call for any fluid symptoms or questions.    

## 2018-06-10 ENCOUNTER — Ambulatory Visit (INDEPENDENT_AMBULATORY_CARE_PROVIDER_SITE_OTHER): Payer: Self-pay

## 2018-06-10 DIAGNOSIS — I429 Cardiomyopathy, unspecified: Secondary | ICD-10-CM

## 2018-06-10 DIAGNOSIS — I5022 Chronic systolic (congestive) heart failure: Secondary | ICD-10-CM

## 2018-06-10 DIAGNOSIS — Z95810 Presence of automatic (implantable) cardiac defibrillator: Secondary | ICD-10-CM

## 2018-06-10 NOTE — Progress Notes (Signed)
Remote ICD transmission.   

## 2018-06-11 ENCOUNTER — Telehealth: Payer: Self-pay

## 2018-06-11 NOTE — Progress Notes (Signed)
EPIC Encounter for ICM Monitoring  Patient Name: George Barber is a 57 y.o. male Date: 06/11/2018 Primary Care Physican: Bennie Pierini, FNP Primary Cardiologist:Taylor Electrophysiologist: Ladona Ridgel Last Weight: 147lbs Today's Weight:  unknown       Attempted call to patient and unable to reach.  Left detailed message, per DPR, regarding transmission.  Transmission reviewed.    Thoracic impedance normal.   Prescribed: Furosemide 20 mg 1 tablet daily  Recommendations: Left voice mail with ICM number and encouraged to call if experiencing any fluid symptoms.  Follow-up plan: ICM clinic phone appointment on 07/11/2018.    Copy of ICM check sent to Dr. Ladona Ridgel.   3 month ICM trend: 06/10/2018    1 Year ICM trend:       Karie Soda, RN 06/11/2018 10:54 AM

## 2018-06-11 NOTE — Telephone Encounter (Signed)
Remote ICM transmission received.  Attempted call to patient regarding ICM remote transmission and left detailed message, per DPR, with next ICM remote transmission date of 07/11/2018.  Advised to return call for any fluid symptoms or questions.    

## 2018-06-13 ENCOUNTER — Encounter: Payer: Self-pay | Admitting: Cardiology

## 2018-07-11 ENCOUNTER — Ambulatory Visit (INDEPENDENT_AMBULATORY_CARE_PROVIDER_SITE_OTHER): Payer: Self-pay

## 2018-07-11 DIAGNOSIS — Z95810 Presence of automatic (implantable) cardiac defibrillator: Secondary | ICD-10-CM

## 2018-07-11 DIAGNOSIS — I5022 Chronic systolic (congestive) heart failure: Secondary | ICD-10-CM

## 2018-07-12 NOTE — Progress Notes (Signed)
EPIC Encounter for ICM Monitoring  Patient Name: George Barber is a 57 y.o. male Date: 07/12/2018 Primary Care Physican: Bennie Pierini, FNP Primary Cardiologist:Taylor Electrophysiologist: Ladona Ridgel Last Weight: 147lbs Today's Weight:  unknown                                                   Transmission reviewed.    Thoracic impedance normal.   Prescribed: Furosemide 20 mg 1 tablet daily  Recommendations: None  Follow-up plan: ICM clinic phone appointment on 08/15/2018    Copy of ICM check sent to Dr. Ladona Ridgel.   3 month ICM trend: 07/11/2018    1 Year ICM trend:       Karie Soda, RN 07/12/2018 1:53 PM

## 2018-08-07 LAB — CUP PACEART REMOTE DEVICE CHECK
Date Time Interrogation Session: 20200115084005
Implantable Lead Implant Date: 20120813
Implantable Lead Model: 180
Implantable Lead Serial Number: 303783
Implantable Pulse Generator Implant Date: 20120813
MDC IDC LEAD LOCATION: 753860
MDC IDC PG SERIAL: 1005776

## 2018-08-15 ENCOUNTER — Ambulatory Visit (INDEPENDENT_AMBULATORY_CARE_PROVIDER_SITE_OTHER): Payer: Self-pay

## 2018-08-15 DIAGNOSIS — I5022 Chronic systolic (congestive) heart failure: Secondary | ICD-10-CM

## 2018-08-15 DIAGNOSIS — Z95810 Presence of automatic (implantable) cardiac defibrillator: Secondary | ICD-10-CM

## 2018-08-15 NOTE — Progress Notes (Signed)
EPIC Encounter for ICM Monitoring  Patient Name: George Barber is a 57 y.o. male Date: 08/15/2018 Primary Care Physican: Bennie Pierini, FNP Primary Cardiologist:Taylor Electrophysiologist: Ladona Ridgel Last Weight:147lbs Today's Weight: unknown   Transmission reviewed.   Thoracic impedance normal.   Prescribed:Furosemide 20 mg 1 tablet daily  Recommendations:None  Follow-up plan: ICM clinic phone appointment on2/25/2020   Copy of ICM check sent to [name].   3 month ICM trend: 08/15/2018    1 Year ICM trend:       Karie Soda, RN 08/15/2018 5:42 PM

## 2018-09-10 ENCOUNTER — Telehealth: Payer: Self-pay

## 2018-09-17 ENCOUNTER — Ambulatory Visit (INDEPENDENT_AMBULATORY_CARE_PROVIDER_SITE_OTHER): Payer: Self-pay

## 2018-09-17 DIAGNOSIS — Z95810 Presence of automatic (implantable) cardiac defibrillator: Secondary | ICD-10-CM

## 2018-09-17 DIAGNOSIS — I5022 Chronic systolic (congestive) heart failure: Secondary | ICD-10-CM

## 2018-09-18 ENCOUNTER — Telehealth: Payer: Self-pay

## 2018-09-18 NOTE — Telephone Encounter (Signed)
Remote ICM transmission received.  Attempted call to patient regarding ICM remote transmission and left message to return call   

## 2018-09-18 NOTE — Progress Notes (Signed)
EPIC Encounter for ICM Monitoring  Patient Name: MORIO DUKART is a 58 y.o. male Date: 09/18/2018 Primary Care Physican: Bennie Pierini, FNP Primary Cardiologist:Taylor Electrophysiologist: Ladona Ridgel Last Weight:147lbs Today's Weight: unknown   Attempted call to patient and unable to reach.  Left message to return call. Transmission reviewed.    Thoracic impedance normal.   Prescribed:Furosemide 20 mg 1 tablet daily  Recommendations:Unable to reach.  Follow-up plan: ICM clinic phone appointment on3/30/2020  Copy of ICM check sent to [name].  3 month ICM trend: 09/17/2018    1 Year ICM trend:       Karie Soda, RN 09/18/2018 8:44 AM

## 2018-09-18 NOTE — Progress Notes (Addendum)
Patient returned call.  Transmission reviewed.  He reported he is feeling fine and denied any fluid symptoms.  No changes and advised to call if he develops any fluid symptoms. Advised he is due to make an appt with Dr Ladona Ridgel for April and to call the office to schedule that.  Also explained to update his DPR form to include can leave a message on cell phone in addition to home phone if he wants to do that.

## 2018-10-06 ENCOUNTER — Other Ambulatory Visit: Payer: Self-pay | Admitting: Internal Medicine

## 2018-10-21 ENCOUNTER — Other Ambulatory Visit: Payer: Self-pay

## 2018-10-21 ENCOUNTER — Ambulatory Visit (INDEPENDENT_AMBULATORY_CARE_PROVIDER_SITE_OTHER): Payer: Self-pay

## 2018-10-21 DIAGNOSIS — I5022 Chronic systolic (congestive) heart failure: Secondary | ICD-10-CM

## 2018-10-21 DIAGNOSIS — Z95810 Presence of automatic (implantable) cardiac defibrillator: Secondary | ICD-10-CM

## 2018-10-23 ENCOUNTER — Telehealth: Payer: Self-pay | Admitting: Nurse Practitioner

## 2018-10-23 ENCOUNTER — Other Ambulatory Visit: Payer: Self-pay

## 2018-10-23 DIAGNOSIS — J441 Chronic obstructive pulmonary disease with (acute) exacerbation: Secondary | ICD-10-CM

## 2018-10-23 NOTE — Telephone Encounter (Signed)
Not seen since 2018  Refused to pharmacy that patient needs to be seen

## 2018-10-23 NOTE — Telephone Encounter (Signed)
What is the name of the medication? Anoro and proventil   Have you contacted your pharmacy to request a refill? yes  Which pharmacy would you like this sent to? [facility] [place]    Patient notified that their request is being sent to the clinical staff for review and that they should receive a call once it is complete. If they do not receive a call within 24 hours they can check with their pharmacy or our office.

## 2018-10-23 NOTE — Progress Notes (Signed)
EPIC Encounter for ICM Monitoring  Patient Name: George Barber is a 58 y.o. male Date: 10/23/2018 Primary Care Physican: Bennie Pierini, FNP Primary Cardiologist:Taylor Electrophysiologist: Ladona Ridgel Last Weight:147lbs Today's Weight: unknown   Heart failure questions reviewed and patient is doing fine.  He denies any fluid symptoms.   Thoracic impedance normal.   Prescribed:Furosemide 20 mg 1 tablet daily  Recommendations: No changes and encouraged to call for fluid symptoms.   Follow-up plan: ICM clinic phone appointment on5/10/2018  Copy of ICM check sent to [name].  3 month ICM trend: 10/21/2018    1 Year ICM trend:       Karie Soda, RN 10/23/2018 11:41 AM

## 2018-10-25 ENCOUNTER — Telehealth: Payer: Self-pay | Admitting: Nurse Practitioner

## 2018-10-25 NOTE — Telephone Encounter (Signed)
Spoke to the pharmacy and advised pt hasn't been seen here since 2018 and MMM can't do any refills on any medications.

## 2018-10-26 ENCOUNTER — Other Ambulatory Visit: Payer: Self-pay | Admitting: Family

## 2018-10-26 DIAGNOSIS — J441 Chronic obstructive pulmonary disease with (acute) exacerbation: Secondary | ICD-10-CM

## 2018-10-26 MED ORDER — ALBUTEROL SULFATE HFA 108 (90 BASE) MCG/ACT IN AERS: 2.0000 | INHALATION_SPRAY | Freq: Four times a day (QID) | RESPIRATORY_TRACT | 1 refills | Status: DC | PRN

## 2018-10-26 MED ORDER — UMECLIDINIUM-VILANTEROL 62.5-25 MCG/INH IN AEPB: 1.0000 | INHALATION_SPRAY | Freq: Every day | RESPIRATORY_TRACT | 2 refills | Status: DC

## 2018-10-26 NOTE — Progress Notes (Signed)
Pt called requesting refill on Anoro and albuterol. RX sent to pharmacy.

## 2018-11-14 ENCOUNTER — Other Ambulatory Visit: Payer: Self-pay | Admitting: Internal Medicine

## 2018-11-25 ENCOUNTER — Other Ambulatory Visit: Payer: Self-pay

## 2018-11-25 ENCOUNTER — Ambulatory Visit (INDEPENDENT_AMBULATORY_CARE_PROVIDER_SITE_OTHER): Payer: Self-pay

## 2018-11-25 DIAGNOSIS — I5022 Chronic systolic (congestive) heart failure: Secondary | ICD-10-CM

## 2018-11-25 DIAGNOSIS — Z95810 Presence of automatic (implantable) cardiac defibrillator: Secondary | ICD-10-CM

## 2018-11-26 ENCOUNTER — Telehealth: Payer: Self-pay | Admitting: Internal Medicine

## 2018-11-26 NOTE — Telephone Encounter (Signed)
New message   [facility] for pt to call back and schedule virtual visit with Dr. Johnna Acosta. Will offer pt doxemity video, if pt has a smart phone.

## 2018-11-26 NOTE — Progress Notes (Signed)
EPIC Encounter for ICM Monitoring  Patient Name: George Barber is a 58 y.o. male Date: 11/26/2018 Primary Care Physican: Bennie Pierini, FNP Primary Cardiologist:Taylor Electrophysiologist: Ladona Ridgel Last Weight:147lbs 11/26/2018 Weight: 146 lbs   Heart failure questions reviewed and patient is doing fine.  He denies any fluid symptoms.    Thoracic impedance normal.   Prescribed:Furosemide 20 mg 1 tablet daily  Recommendations: No changes and encouraged to call for fluid symptoms.   Follow-up plan: ICM clinic phone appointment on6/02/2019.  He reports scheduler called this morning to set up appt with Dr Ladona Ridgel and he will give her a call back.  Copy of ICM check sent to [name].  3 month ICM trend: 11/25/2018    1 Year ICM trend:       Karie Soda, RN 11/26/2018 1:18 PM

## 2018-12-01 ENCOUNTER — Other Ambulatory Visit: Payer: Self-pay | Admitting: Internal Medicine

## 2018-12-04 MED ORDER — CARVEDILOL 12.5 MG PO TABS: 12.5000 mg | ORAL_TABLET | Freq: Two times a day (BID) | ORAL | 0 refills | Status: DC

## 2018-12-04 NOTE — Telephone Encounter (Signed)
New Message   Patient would like a nurse to call him regarding appointment, he would like to discuss whether or not it would be good for him to do a virtual visit.

## 2018-12-04 NOTE — Telephone Encounter (Signed)
Follow up    Returned call to pt per RN-it is appropriate for pt to have a virtual visit. Dr. Ladona Ridgel will review remotes with pt. LMOM for pt to return call.

## 2018-12-04 NOTE — Addendum Note (Signed)
Addended by: Demetrios Loll on: 12/04/2018 11:58 AM   Modules accepted: Orders

## 2018-12-12 ENCOUNTER — Other Ambulatory Visit: Payer: Self-pay | Admitting: Internal Medicine

## 2018-12-30 ENCOUNTER — Ambulatory Visit (INDEPENDENT_AMBULATORY_CARE_PROVIDER_SITE_OTHER): Payer: Self-pay

## 2018-12-30 DIAGNOSIS — I5022 Chronic systolic (congestive) heart failure: Secondary | ICD-10-CM

## 2018-12-30 DIAGNOSIS — Z95810 Presence of automatic (implantable) cardiac defibrillator: Secondary | ICD-10-CM

## 2018-12-31 ENCOUNTER — Encounter: Payer: Self-pay | Admitting: *Deleted

## 2018-12-31 NOTE — Progress Notes (Signed)
EPIC Encounter for ICM Monitoring  Patient Name: George Barber is a 58 y.o. male Date: 12/31/2018 Primary Care Physican: Chevis Pretty, [place] Primary Cardiologist:Taylor Electrophysiologist: Lovena Le Last Weight:147lbs 11/26/2018 Weight: 146 lbs   Heart failure questions reviewed and patient is doing fine. He denies any fluid symptoms.      Thoracic impedance normal.   Prescribed:Furosemide 20 mg 1 tablet daily  Recommendations:No changes and encouraged to call for fluid symptoms.  Follow-up plan: ICM clinic phone appointment on7/13/2020.    Copy of ICM check sent to [name].  3 month ICM trend: 12/30/2018    1 Year ICM trend:       Rosalene Billings, RN 12/31/2018 12:24 PM

## 2019-01-01 ENCOUNTER — Telehealth: Payer: Self-pay

## 2019-01-01 NOTE — Telephone Encounter (Signed)
Left message for patient to remind of missed remote transmission.  

## 2019-01-06 ENCOUNTER — Other Ambulatory Visit: Payer: Self-pay | Admitting: Internal Medicine

## 2019-02-05 ENCOUNTER — Other Ambulatory Visit: Payer: Self-pay | Admitting: Family

## 2019-02-05 NOTE — Telephone Encounter (Signed)
Last refills done by a Dr. Lovena Le with request to schedule an appointment.

## 2019-02-05 NOTE — Telephone Encounter (Signed)
Last office visit to [facility] was in 2018.

## 2019-02-14 ENCOUNTER — Other Ambulatory Visit: Payer: Self-pay | Admitting: Internal Medicine

## 2019-02-16 ENCOUNTER — Other Ambulatory Visit: Payer: Self-pay | Admitting: Family

## 2019-02-16 DIAGNOSIS — J441 Chronic obstructive pulmonary disease with (acute) exacerbation: Secondary | ICD-10-CM

## 2019-02-17 NOTE — Progress Notes (Signed)
No ICM remote transmission received for 02/03/2019 and next ICM transmission scheduled for 03/03/2019.   

## 2019-02-18 ENCOUNTER — Other Ambulatory Visit: Payer: Self-pay | Admitting: Internal Medicine

## 2019-03-07 NOTE — Progress Notes (Signed)
No ICM remote transmission received for 03/03/2019 and next ICM transmission scheduled for 05/05/2019.

## 2019-03-10 ENCOUNTER — Other Ambulatory Visit: Payer: Self-pay | Admitting: Internal Medicine

## 2019-03-12 ENCOUNTER — Other Ambulatory Visit: Payer: Self-pay | Admitting: Internal Medicine

## 2019-03-13 NOTE — Telephone Encounter (Signed)
Outpatient Medication Detail   Disp Refills Start End   losartan (COZAAR) 100 MG tablet 7 tablet 0 03/11/2019    Sig: Take 1/2 (one-half) tablet by mouth once daily   Sent to pharmacy as: losartan (COZAAR) 100 MG tablet   Notes to Pharmacy: Please make appt for future refills [phone] 2nd attempt   E-Prescribing Status: Receipt confirmed by pharmacy (03/11/2019 4:26 PM EDT)   Pharmacy  [facility], [place] [id number]

## 2019-03-14 ENCOUNTER — Other Ambulatory Visit: Payer: Self-pay | Admitting: Internal Medicine

## 2019-03-18 ENCOUNTER — Other Ambulatory Visit: Payer: Self-pay | Admitting: Internal Medicine

## 2019-03-21 ENCOUNTER — Other Ambulatory Visit: Payer: Self-pay | Admitting: Family

## 2019-03-21 DIAGNOSIS — J441 Chronic obstructive pulmonary disease with (acute) exacerbation: Secondary | ICD-10-CM

## 2019-03-27 ENCOUNTER — Other Ambulatory Visit: Payer: Self-pay | Admitting: Internal Medicine

## 2019-04-01 ENCOUNTER — Other Ambulatory Visit: Payer: Self-pay | Admitting: Internal Medicine

## 2019-04-02 ENCOUNTER — Ambulatory Visit (INDEPENDENT_AMBULATORY_CARE_PROVIDER_SITE_OTHER): Payer: Self-pay | Admitting: *Deleted

## 2019-04-02 DIAGNOSIS — I429 Cardiomyopathy, unspecified: Secondary | ICD-10-CM

## 2019-04-02 DIAGNOSIS — I5022 Chronic systolic (congestive) heart failure: Secondary | ICD-10-CM

## 2019-04-04 ENCOUNTER — Other Ambulatory Visit: Payer: Self-pay | Admitting: Internal Medicine

## 2019-04-06 LAB — CUP PACEART REMOTE DEVICE CHECK
Date Time Interrogation Session: 20200913151636
Implantable Lead Implant Date: 20120813
Implantable Lead Location: 753860
Implantable Lead Model: 180
Implantable Lead Serial Number: 303783
Implantable Pulse Generator Implant Date: 20120813
Pulse Gen Serial Number: 1005776

## 2019-04-07 ENCOUNTER — Other Ambulatory Visit: Payer: Self-pay | Admitting: Internal Medicine

## 2019-04-11 ENCOUNTER — Other Ambulatory Visit: Payer: Self-pay | Admitting: Internal Medicine

## 2019-04-14 ENCOUNTER — Other Ambulatory Visit: Payer: Self-pay | Admitting: Internal Medicine

## 2019-04-17 NOTE — Progress Notes (Signed)
Remote ICD transmission.   

## 2019-04-23 ENCOUNTER — Other Ambulatory Visit: Payer: Self-pay | Admitting: Internal Medicine

## 2019-04-24 ENCOUNTER — Other Ambulatory Visit: Payer: Self-pay | Admitting: Internal Medicine

## 2019-04-30 ENCOUNTER — Other Ambulatory Visit: Payer: Self-pay | Admitting: Nurse Practitioner

## 2019-04-30 ENCOUNTER — Other Ambulatory Visit: Payer: Self-pay | Admitting: Family

## 2019-04-30 DIAGNOSIS — J441 Chronic obstructive pulmonary disease with (acute) exacerbation: Secondary | ICD-10-CM

## 2019-04-30 NOTE — Telephone Encounter (Signed)
Message left for patient that he must be seen. Last seen in 2018

## 2019-04-30 NOTE — Telephone Encounter (Signed)
Message left for patient that he needs to be seen. Last seen in 2018

## 2019-05-02 ENCOUNTER — Other Ambulatory Visit: Payer: Self-pay | Admitting: Nurse Practitioner

## 2019-05-02 DIAGNOSIS — J441 Chronic obstructive pulmonary disease with (acute) exacerbation: Secondary | ICD-10-CM

## 2019-05-02 MED ORDER — ALBUTEROL SULFATE HFA 108 (90 BASE) MCG/ACT IN AERS: INHALATION_SPRAY | RESPIRATORY_TRACT | 0 refills | Status: DC

## 2019-05-02 NOTE — Telephone Encounter (Signed)
Spoke with patient and he wasn't sure what inhaler he needed. Sent albuterol inhaler to [facility]. Patient aware

## 2019-05-05 ENCOUNTER — Other Ambulatory Visit: Payer: Self-pay

## 2019-05-05 ENCOUNTER — Ambulatory Visit (INDEPENDENT_AMBULATORY_CARE_PROVIDER_SITE_OTHER): Payer: Self-pay

## 2019-05-05 DIAGNOSIS — Z95810 Presence of automatic (implantable) cardiac defibrillator: Secondary | ICD-10-CM

## 2019-05-05 DIAGNOSIS — I5022 Chronic systolic (congestive) heart failure: Secondary | ICD-10-CM

## 2019-05-07 ENCOUNTER — Telehealth: Payer: Self-pay

## 2019-05-07 NOTE — Telephone Encounter (Signed)
Remote ICM transmission received.  Attempted call to patient regarding ICM remote transmission and no message left. 

## 2019-05-07 NOTE — Progress Notes (Signed)
EPIC Encounter for ICM Monitoring  Patient Name: George Barber is a 58 y.o. male Date: 05/07/2019 Primary Care Physican: Chevis Pretty, [place] Primary Cardiologist:Taylor Electrophysiologist: Lovena Le Last Weight:147lbs Last Weight: 146 lbs   Attempted call to patient and unable to reach.   Transmission reviewed.    Thoracic impedance suggestive of possible ongoing fluid accumulation since 04/30/2019  Prescribed:Furosemide 20 mg 1 tablet daily  Recommendations: Unable to reach.    Follow-up plan: ICM clinic phone appointment on 05/19/2019 to recheck fluid levels.   91 day device clinic remote transmission 07/02/2019.  Office appt 05/12/2019 with Oda Kilts PA.    Copy of ICM check sent to Dr. Lovena Le.   3 month ICM trend: 05/05/2019    1 Year ICM trend:       Rosalene Billings, RN 05/07/2019 5:11 PM

## 2019-05-09 NOTE — Progress Notes (Signed)
Electrophysiology Office Note Date: 05/12/2019  ID:  George Barber, DOB 07/14/1961, MRN [id number]  PCP: George Pierini, FNP Primary Cardiologist: No primary care provider on file. Electrophysiologist: None  CC: Routine ICD follow-up  George Barber is a 58 y.o. male with h/o VT s/p ICD insertion, NICM, and chronic systolic CHF. seen today for George Barber.  They present today for routine electrophysiology followup.  Since last being seen in our clinic, the patient reports doing well. They deny chest pain, palpitations, PND, orthopnea, nausea, vomiting, dizziness, syncope, edema, weight gain, or early satiety.  He has not had ICD shocks. He has occasional SOB with moderate exertion in the setting of his COPD. Improves/relieves with MDI.   Device History: St. Jude Single Chamber ICD implanted 03/06/2011 for VT History of appropriate therapy: No History of AAD therapy: Yes (Amiodarone)   Past Medical History:  Diagnosis Date  . Elevated LFTs   . Emphysema   . Fluttering heart   . NICM (nonischemic cardiomyopathy) (HCC)    cath 12/13/10: Normal cors, EF 10-15%;  b. echo 5/12 EF 15%, mild MR, mod LAE, mild RVE, mild to mod RAE, mild to mod TR, PASP 44  . NSVT (nonsustained ventricular tachycardia) (HCC)    Life Vest; amiodarone rx  . Systolic CHF, chronic (HCC)    Past Surgical History:  Procedure Laterality Date  . 2D ECHOCARDIOGRAM  02/2011  . CARDIAC CATHETERIZATION      Current Outpatient Medications  Medication Sig Dispense Refill  . albuterol (VENTOLIN HFA) 108 (90 Base) MCG/ACT inhaler INHALE 2 PUFFS BY MOUTH EVERY 6 HOURS AS NEEDED FOR WHEEZING AND FOR SHORTNESS OF BREATH 9 g 0  . amiodarone (PACERONE) 200 MG tablet Take 1 tablet (200 mg total) by mouth daily. schedule an appt for further refills 2nd attempt 15 tablet 0  . ANORO ELLIPTA 62.5-25 MCG/INH AEPB Inhale 1 puff by mouth once daily 60 each 0  . aspirin 81 MG tablet Take 81 mg by mouth daily.      .  carvedilol (COREG) 12.5 MG tablet TAKE 1 TABLET BY MOUTH TWICE DAILY WITH MEALS schedule an appt for further refills 2nd attempt 15 tablet 0  . furosemide (LASIX) 20 MG tablet Take 1 tablet (20 mg total) by mouth daily. schedule an appt for further refills 2nd attempt 15 tablet 0  . losartan (COZAAR) 100 MG tablet Take 0.5 tablets (50 mg total) by mouth daily. schedule an appt for further refills 2nd attempt 7 tablet 0  . spironolactone (ALDACTONE) 25 MG tablet Take 1 tablet (25 mg total) by mouth daily. schedule an appt for further refills 2nd attempt 15 tablet 0   No current facility-administered medications for this visit.     Allergies:   Patient has no known allergies.   Social History: Social History   Socioeconomic History  . Marital status: Married    Spouse name: Not on file  . Number of children: 1  . Years of education: Not on file  . Highest education level: Not on file  Occupational History  . Occupation: ELECTRICAL    Employer: [employer]  Social Needs  . Financial resource strain: Not on file  . Food insecurity    Worry: Not on file    Inability: Not on file  . Transportation needs    Medical: Not on file    Non-medical: Not on file  Tobacco Use  . Smoking status: Former Smoker    Packs/day: 1.00  Years: 15.00    Pack years: 15.00    Quit date: 10/23/2010    Years since quitting: 8.5  . Smokeless tobacco: Never Used  Substance and Sexual Activity  . Alcohol use: No  . Drug use: No  . Sexual activity: Not on file  Lifestyle  . Physical activity    Days per week: Not on file    Minutes per session: Not on file  . Stress: Not on file  Relationships  . Social Herbalist on phone: Not on file    Gets together: Not on file    Attends religious service: Not on file    Active member of club or organization: Not on file    Attends meetings of clubs or organizations: Not on file    Relationship status: Not on file  . Intimate partner  violence    Fear of current or ex partner: Not on file    Emotionally abused: Not on file    Physically abused: Not on file    Forced sexual activity: Not on file  Other Topics Concern  . Not on file  Social History Narrative    George Barber is married and lives with his wife.  They have     1 child together.  He is an Clinical biochemist.  He smoked for 20 years and     quit 1 month ago.  He quit alcohol 1 month ago, after a 20-year history     of what he describes as social alcohol, but does endorse drinking 6-8     beers per day.          Family History: Family History  Problem Relation Age of Onset  . Cancer Mother 19  . Coronary artery disease Father        unknown  . Emphysema Father     Review of Systems: All other systems reviewed and are otherwise negative except as noted above.   Physical Exam: Vitals:   05/12/19 1011  BP: (!) 144/96  Pulse: 71  SpO2: 90%  Weight: 144 lb 12.8 oz (65.7 kg)  Height: 5\' 10"  (1.778 m)     GEN- The patient is well appearing, alert and oriented x 3 today.   HEENT: normocephalic, atraumatic; sclera clear, conjunctiva pink; hearing intact; oropharynx clear; neck supple, no JVP Lymph- no cervical lymphadenopathy Lungs- Clear to ausculation bilaterally, normal work of breathing.  No wheezes, rales, rhonchi Heart- Regular rate and rhythm, no murmurs, rubs or gallops, PMI not laterally displaced GI- soft, non-tender, non-distended, bowel sounds present, no hepatosplenomegaly Extremities- no clubbing, cyanosis, or edema; DP/PT/radial pulses 2+ bilaterally MS- no significant deformity or atrophy Skin- warm and dry, no rash or lesion; ICD pocket well healed Psych- euthymic mood, full affect Neuro- strength and sensation are intact  ICD interrogation- reviewed in detail today,  See PACEART report  EKG:  EKG is ordered today. The ekg ordered today shows NSR 71 bpm with PVC  Recent Labs: No results found for requested labs within last 8760 hours.    Wt Readings from Last 3 Encounters:  05/12/19 144 lb 12.8 oz (65.7 kg)  10/30/17 141 lb 6.4 oz (64.1 kg)  06/27/17 139 lb (63 kg)     Other studies Reviewed: Additional studies/ records that were reviewed today include: Previous office notes, previous remote checks, most recent labwork.    Assessment and Plan:  1.  Chronic systolic dysfunction s/p St. Jude single chamber ICD  Euvolemic today. NYHA  II symptoms Stable on an appropriate medical regimen Will not purse Entresto with no insurance/cost.  Normal ICD function See Pace Art report No changes today Consider repeat echo pending labwork. No insurance, so pt may not be willing.   2. VT No episodes in the interim.  Continue current medications.    Current medicines are reviewed at length with the patient today.   The patient does not have concerns regarding his medicines.  The following changes were made today:  none  Labs/ tests ordered today include:  Orders Placed This Encounter  Procedures  . Comprehensive metabolic panel  . T3, free  . T4, free  . TSH  . CBC  . EKG 12-Lead    Disposition:   Follow up with George Barber annually.   Dustin Flock, PA-C  05/12/2019 10:35 AM  [facility] [address] [place] [address] [phone] (office) [phone] (fax)

## 2019-05-12 ENCOUNTER — Encounter: Payer: Self-pay | Admitting: Student

## 2019-05-12 ENCOUNTER — Other Ambulatory Visit: Payer: Self-pay

## 2019-05-12 ENCOUNTER — Ambulatory Visit: Payer: Self-pay | Admitting: Student

## 2019-05-12 VITALS — BP 144/96 | HR 71 | Ht 70.0 in | Wt 144.8 lb

## 2019-05-12 DIAGNOSIS — I4729 Other ventricular tachycardia: Secondary | ICD-10-CM

## 2019-05-12 DIAGNOSIS — I5022 Chronic systolic (congestive) heart failure: Secondary | ICD-10-CM

## 2019-05-12 DIAGNOSIS — I472 Ventricular tachycardia: Secondary | ICD-10-CM

## 2019-05-12 LAB — COMPREHENSIVE METABOLIC PANEL
ALT: 20 IU/L (ref 0–44)
AST: 27 IU/L (ref 0–40)
Albumin/Globulin Ratio: 2.3 — ABNORMAL HIGH (ref 1.2–2.2)
Albumin: 4.6 g/dL (ref 3.8–4.9)
Alkaline Phosphatase: 65 IU/L (ref 39–117)
BUN/Creatinine Ratio: 12 (ref 9–20)
BUN: 11 mg/dL (ref 6–24)
Bilirubin Total: 0.5 mg/dL (ref 0.0–1.2)
CO2: 23 mmol/L (ref 20–29)
Calcium: 9.7 mg/dL (ref 8.7–10.2)
Chloride: 101 mmol/L (ref 96–106)
Creatinine, Ser: 0.93 mg/dL (ref 0.76–1.27)
GFR calc Af Amer: 105 mL/min/{1.73_m2} (ref >59)
GFR calc non Af Amer: 91 mL/min/{1.73_m2} (ref >59)
Globulin, Total: 2 g/dL (ref 1.5–4.5)
Glucose: 82 mg/dL (ref 65–99)
Potassium: 4.5 mmol/L (ref 3.5–5.2)
Sodium: 140 mmol/L (ref 134–144)
Total Protein: 6.6 g/dL (ref 6.0–8.5)

## 2019-05-12 LAB — CBC
Hematocrit: 45 % (ref 37.5–51.0)
Hemoglobin: 15.7 g/dL (ref 13.0–17.7)
MCH: 35 pg — ABNORMAL HIGH (ref 26.6–33.0)
MCHC: 34.9 g/dL (ref 31.5–35.7)
MCV: 100 fL — ABNORMAL HIGH (ref 79–97)
Platelets: 331 10*3/uL (ref 150–450)
RBC: 4.48 x10E6/uL (ref 4.14–5.80)
RDW: 11.2 % — ABNORMAL LOW (ref 11.6–15.4)
WBC: 9 10*3/uL (ref 3.4–10.8)

## 2019-05-12 LAB — CUP PACEART INCLINIC DEVICE CHECK
Battery Remaining Longevity: 38 mo
Brady Statistic RV Percent Paced: 0.01 %
Date Time Interrogation Session: 20201019104713
HighPow Impedance: 70.875
Implantable Lead Implant Date: 20120813
Implantable Lead Location: 753860
Implantable Lead Model: 180
Implantable Lead Serial Number: 303783
Implantable Pulse Generator Implant Date: 20120813
Lead Channel Impedance Value: 325 Ohm
Lead Channel Pacing Threshold Amplitude: 1.5 V
Lead Channel Pacing Threshold Amplitude: 1.5 V
Lead Channel Pacing Threshold Pulse Width: 0.8 ms
Lead Channel Pacing Threshold Pulse Width: 0.8 ms
Lead Channel Sensing Intrinsic Amplitude: 11.8 mV
Lead Channel Setting Pacing Amplitude: 2.5 V
Lead Channel Setting Pacing Pulse Width: 0.8 ms
Lead Channel Setting Sensing Sensitivity: 0.5 mV
Pulse Gen Serial Number: 1005776

## 2019-05-12 LAB — T3, FREE: T3, Free: 2.9 pg/mL (ref 2.0–4.4)

## 2019-05-12 LAB — T4, FREE: Free T4: 1.56 ng/dL (ref 0.82–1.77)

## 2019-05-12 LAB — TSH: TSH: 2.91 u[IU]/mL (ref 0.450–4.500)

## 2019-05-12 NOTE — Patient Instructions (Signed)
Medication Instructions:   Your physician recommends that you continue on your current medications as directed. Please refer to the Current Medication list given to you today.  *If you need a refill on your cardiac medications before your next appointment, please call your pharmacy*  Lab Work:  You will have labs drawn today: CMET, CBC, TSH, Free T3, and Free T4  If you have labs (blood work) drawn today and your tests are completely normal, you will receive your results only by: [name] Kitchen MyChart Message (if you have MyChart) OR . A paper copy in the mail If you have any lab test that is abnormal or we need to change your treatment, we will call you to review the results.  Testing/Procedures:  None ordered today  Follow-Up: At [facility], you and your health needs are our priority.  As part of our continuing mission to provide you with exceptional heart care, we have created designated Provider Care Teams.  These Care Teams include your primary Cardiologist (physician) and Advanced Practice Providers (APPs -  Physician Assistants and Nurse Practitioners) who all work together to provide you with the care you need, when you need it.  Your next appointment:   12 months  The format for your next appointment:   In Person  Provider:   You may see Cristopher Peru, MD or one of the following Advanced Practice Providers on your designated Care Team:    Chanetta Marshall, NP  Tommye Standard, PA-C  Legrand Como "[place]" [place], [place]

## 2019-05-13 ENCOUNTER — Telehealth: Payer: Self-pay | Admitting: Nurse Practitioner

## 2019-05-13 ENCOUNTER — Other Ambulatory Visit: Payer: Self-pay | Admitting: Internal Medicine

## 2019-05-14 NOTE — Telephone Encounter (Signed)
Requesting refills on medications.  Patient was advised he could not have refills until he has been seen by provider and established care.  He has appointment in November.

## 2019-05-19 ENCOUNTER — Ambulatory Visit (INDEPENDENT_AMBULATORY_CARE_PROVIDER_SITE_OTHER): Payer: Self-pay

## 2019-05-19 DIAGNOSIS — Z95810 Presence of automatic (implantable) cardiac defibrillator: Secondary | ICD-10-CM

## 2019-05-19 DIAGNOSIS — I5022 Chronic systolic (congestive) heart failure: Secondary | ICD-10-CM

## 2019-05-19 NOTE — Progress Notes (Signed)
EPIC Encounter for ICM Monitoring  Patient Name: George Barber is a 58 y.o. male Date: 05/19/2019 Primary Care Physican: Chevis Pretty, [place] Primary Cardiologist:Taylor Electrophysiologist: Lovena Le Last Weight: 146 lbs   Transmission reviewed.   CorVue thoracic impedance returned to normal.  Prescribed:Furosemide 20 mg 1 tablet daily  Recommendations: None  Follow-up plan: ICM clinic phone appointment on 07/03/2019.   91 day device clinic remote transmission 07/02/2019.    Copy of ICM check sent to Dr. Lovena Le.   3 month ICM trend: 05/19/2019    1 Year ICM trend:       Rosalene Billings, RN 05/19/2019 4:16 PM

## 2019-05-20 ENCOUNTER — Other Ambulatory Visit: Payer: Self-pay | Admitting: Internal Medicine

## 2019-05-28 ENCOUNTER — Encounter: Payer: Self-pay | Admitting: Nurse Practitioner

## 2019-05-28 ENCOUNTER — Ambulatory Visit (INDEPENDENT_AMBULATORY_CARE_PROVIDER_SITE_OTHER): Payer: Commercial Managed Care - PPO | Admitting: Nurse Practitioner

## 2019-05-28 DIAGNOSIS — I5022 Chronic systolic (congestive) heart failure: Secondary | ICD-10-CM

## 2019-05-28 DIAGNOSIS — I472 Ventricular tachycardia: Secondary | ICD-10-CM | POA: Diagnosis not present

## 2019-05-28 DIAGNOSIS — Z95810 Presence of automatic (implantable) cardiac defibrillator: Secondary | ICD-10-CM

## 2019-05-28 DIAGNOSIS — I10 Essential (primary) hypertension: Secondary | ICD-10-CM | POA: Diagnosis not present

## 2019-05-28 DIAGNOSIS — I429 Cardiomyopathy, unspecified: Secondary | ICD-10-CM

## 2019-05-28 DIAGNOSIS — I4729 Other ventricular tachycardia: Secondary | ICD-10-CM

## 2019-05-28 DIAGNOSIS — J411 Mucopurulent chronic bronchitis: Secondary | ICD-10-CM

## 2019-05-28 MED ORDER — SPIRONOLACTONE 25 MG PO TABS: 25.0000 mg | ORAL_TABLET | Freq: Every day | ORAL | 1 refills | Status: DC

## 2019-05-28 MED ORDER — ANORO ELLIPTA 62.5-25 MCG/INH IN AEPB: INHALATION_SPRAY | RESPIRATORY_TRACT | 1 refills | Status: DC

## 2019-05-28 MED ORDER — FUROSEMIDE 20 MG PO TABS: 20.0000 mg | ORAL_TABLET | Freq: Every day | ORAL | 1 refills | Status: DC

## 2019-05-28 NOTE — Progress Notes (Signed)
Virtual Visit via telephone Note Due to COVID-19 pandemic this visit was conducted virtually. This visit type was conducted due to national recommendations for restrictions regarding the COVID-19 Pandemic (e.g. social distancing, sheltering in place) in an effort to limit this patient's exposure and mitigate transmission in our community. All issues noted in this document were discussed and addressed.  A physical exam was not performed with this format.  I connected with George Barber on 05/28/19 at 8:35 by telephone and verified that I am speaking with the correct person using two identifiers. George Barber is currently located at work and no one is currently with hinm during visit. The provider, Mary-Margaret Hassell Done, FNP is located in their office at time of visit.  I discussed the limitations, risks, security and privacy concerns of performing an evaluation and management service by telephone and the availability of in person appointments. I also discussed with the patient that there may be a patient responsible charge related to this service. The patient expressed understanding and agreed to proceed.   History and Present Illness:   Chief Complaint: Medical Management of Chronic Issues    HPI:  1. Essential hypertension No c/o chest pain, sob or headache. Does not check blood pressure at home. BP Readings from Last 3 Encounters:  05/12/19 (!) 144/96  10/30/17 138/82  06/27/17 134/80     2. Cardiomyopathy, secondary ([place]) Saw cardiology on 05/12/19 and according to note nothing was changed to plan of care  3. Chronic systolic heart failure (HCC) No swelling or sob  4. NSVT (nonsustained ventricular tachycardia) (HCC) No palpitations  5. Automatic implantable cardioverter-defibrillator in situ Says it has gone off a couple of times in the last year but usually when the cardiologist is working on it.  6. Mucopurulent chronic bronchitis ([place]) He is on anoro and becomes sob  if does not take.    Outpatient Encounter Medications as of 05/28/2019  Medication Sig  . albuterol (VENTOLIN HFA) 108 (90 Base) MCG/ACT inhaler INHALE 2 PUFFS BY MOUTH EVERY 6 HOURS AS NEEDED FOR WHEEZING AND FOR SHORTNESS OF BREATH  . amiodarone (PACERONE) 200 MG tablet Take 1 tablet (200 mg total) by mouth daily.  George Barber ELLIPTA 62.5-25 MCG/INH AEPB Inhale 1 puff by mouth once daily  . aspirin 81 MG tablet Take 81 mg by mouth daily.    . carvedilol (COREG) 12.5 MG tablet Take 1 tablet (12.5 mg total) by mouth 2 (two) times daily with a meal. TAKE 1 TABLET BY MOUTH TWICE DAILY WITH MEALS . APPOINTMENT REQUIRED FOR FUTURE REFILLS  . furosemide (LASIX) 20 MG tablet Take 1 tablet (20 mg total) by mouth daily. schedule an appt for further refills 2nd attempt  . losartan (COZAAR) 100 MG tablet Take 0.5 tablets (50 mg total) by mouth daily.  [name] Kitchen spironolactone (ALDACTONE) 25 MG tablet Take 1 tablet (25 mg total) by mouth daily. schedule an appt for further refills 2nd attempt     Past Surgical History:  Procedure Laterality Date  . 2D ECHOCARDIOGRAM  02/2011  . CARDIAC CATHETERIZATION      Family History  Problem Relation Age of Onset  . Cancer Mother 22  . Coronary artery disease Father        unknown  . Emphysema Father     New complaints: None today  Social history: [employer]  Controlled substance contract: n/a    Review of Systems  Constitutional: Negative for diaphoresis and weight loss.  Eyes: Negative for blurred  vision, double vision and pain.  Respiratory: Negative for cough and shortness of breath.   Cardiovascular: Negative for chest pain, palpitations, orthopnea and leg swelling.  Gastrointestinal: Negative for abdominal pain.  Skin: Negative for rash.  Neurological: Negative for dizziness, sensory change, loss of consciousness, weakness and headaches.  Endo/Heme/Allergies: Negative for polydipsia. Does not bruise/bleed easily.  Psychiatric/Behavioral:  Negative for memory loss. The patient does not have insomnia.   All other systems reviewed and are negative.    Observations/Objective: Alert and oriented- answers all questions appropriately No distress    Assessment and Plan: George Barber comes in today with chief complaint of Medical Management of Chronic Issues   Diagnosis and orders addressed:  1. Essential hypertension Low sodium diet  2. Cardiomyopathy, secondary (HCC) Keep follow ups with cardiology every 6 months  3. Chronic systolic heart failure (HCC) - furosemide (LASIX) 20 MG tablet; Take 1 tablet (20 mg total) by mouth daily. schedule an appt for further refills 2nd attempt  Dispense: 90 tablet; Refill: 1 - spironolactone (ALDACTONE) 25 MG tablet; Take 1 tablet (25 mg total) by mouth daily. schedule an appt for further refills 2nd attempt  Dispense: 90 tablet; Refill: 1  4. NSVT (nonsustained ventricular tachycardia) (HCC)  5. Automatic implantable cardioverter-defibrillator in situ  6. Mucopurulent chronic bronchitis (HCC) Avoid cigarette smoke - umeclidinium-vilanterol (ANORO ELLIPTA) 62.5-25 MCG/INH AEPB; Inhale 1 puff by mouth once daily  Dispense: 180 each; Refill: 1   Labs results reviewed Health Maintenance reviewed Diet and exercise encouraged  Follow up plan: 6 months     I discussed the assessment and treatment plan with the patient. The patient was provided an opportunity to ask questions and all were answered. The patient agreed with the plan and demonstrated an understanding of the instructions.   The patient was advised to call back or seek an in-person evaluation if the symptoms worsen or if the condition fails to improve as anticipated.  The above assessment and management plan was discussed with the patient. The patient verbalized understanding of and has agreed to the management plan. Patient is aware to call the clinic if symptoms persist or worsen. Patient is aware when to return to  the clinic for a follow-up visit. Patient educated on when it is appropriate to go to the emergency department.   Time call ended:  8:47  I provided 12 minutes of non-face-to-face time during this encounter.    Mary-Margaret Daphine Deutscher, FNP

## 2019-06-10 ENCOUNTER — Telehealth: Payer: Self-pay

## 2019-06-12 ENCOUNTER — Other Ambulatory Visit: Payer: Self-pay | Admitting: Nurse Practitioner

## 2019-06-12 DIAGNOSIS — J441 Chronic obstructive pulmonary disease with (acute) exacerbation: Secondary | ICD-10-CM

## 2019-07-02 ENCOUNTER — Ambulatory Visit (INDEPENDENT_AMBULATORY_CARE_PROVIDER_SITE_OTHER): Payer: Self-pay | Admitting: *Deleted

## 2019-07-02 DIAGNOSIS — Z95810 Presence of automatic (implantable) cardiac defibrillator: Secondary | ICD-10-CM

## 2019-07-03 ENCOUNTER — Ambulatory Visit (INDEPENDENT_AMBULATORY_CARE_PROVIDER_SITE_OTHER): Payer: Self-pay

## 2019-07-03 DIAGNOSIS — Z95810 Presence of automatic (implantable) cardiac defibrillator: Secondary | ICD-10-CM

## 2019-07-03 DIAGNOSIS — I5022 Chronic systolic (congestive) heart failure: Secondary | ICD-10-CM

## 2019-07-03 LAB — CUP PACEART REMOTE DEVICE CHECK
Battery Remaining Longevity: 36 mo
Battery Remaining Percentage: 33 %
Brady Statistic RV Percent Paced: 1 %
Date Time Interrogation Session: 20201210061857
HighPow Impedance: 79 Ohm
Implantable Lead Implant Date: 20120813
Implantable Lead Location: 753860
Implantable Lead Model: 180
Implantable Lead Serial Number: 303783
Implantable Pulse Generator Implant Date: 20120813
Lead Channel Impedance Value: 310 Ohm
Lead Channel Sensing Intrinsic Amplitude: 11.2 mV
Lead Channel Setting Pacing Amplitude: 2.5 V
Lead Channel Setting Pacing Pulse Width: 0.8 ms
Lead Channel Setting Sensing Sensitivity: 0.5 mV
Pulse Gen Serial Number: 1005776

## 2019-07-04 ENCOUNTER — Telehealth: Payer: Self-pay

## 2019-07-04 NOTE — Telephone Encounter (Signed)
Remote ICM transmission received.  Attempted call to patient regarding ICM remote transmission and left detailed message per DPR.  Advised to return call for any fluid symptoms or questions.  

## 2019-07-04 NOTE — Progress Notes (Signed)
EPIC Encounter for ICM Monitoring  Patient Name: George Barber is a 58 y.o. male Date: 07/04/2019 Primary Care Physican: Chevis Pretty, [place] Primary Cardiologist:Taylor Electrophysiologist: Lovena Le LastWeight: 146 lbs   Attempted call to patient and unable to reach.  Left detailed message per DPR regarding transmission. Transmission reviewed.   CorVue thoracic impedancereturned to normal.  Prescribed:Furosemide 20 mg 1 tablet daily  Recommendations: Left voice mail with ICM number and encouraged to call if experiencing any fluid symptoms.  Follow-up plan: ICM clinic phone appointment on 08/04/2019.   91 day device clinic remote transmission 10/01/2019.    Copy of ICM check sent to Dr. Lovena Le.   3 month ICM trend: 07/02/2019    1 Year ICM trend:       Rosalene Billings, RN 07/04/2019 1:43 PM

## 2019-08-04 ENCOUNTER — Ambulatory Visit (INDEPENDENT_AMBULATORY_CARE_PROVIDER_SITE_OTHER): Payer: Self-pay

## 2019-08-04 DIAGNOSIS — Z95810 Presence of automatic (implantable) cardiac defibrillator: Secondary | ICD-10-CM

## 2019-08-04 DIAGNOSIS — I5022 Chronic systolic (congestive) heart failure: Secondary | ICD-10-CM

## 2019-08-08 ENCOUNTER — Telehealth: Payer: Self-pay

## 2019-08-08 NOTE — Telephone Encounter (Signed)
Remote ICM transmission received.  Attempted call to patient regarding ICM remote transmission and left detailed message per DPR.  Advised to return call for any fluid symptoms or questions. Next ICM remote transmission scheduled 08/12/2019.

## 2019-08-08 NOTE — Progress Notes (Signed)
EPIC Encounter for ICM Monitoring  Patient Name: George Barber is a 59 y.o. male Date: 08/08/2019 Primary Care Physican: Bennie Pierini, FNP Primary Cardiologist:Taylor Electrophysiologist: Ladona Ridgel LastWeight: 146 lbs   Attempted call to patient and unable to reach.  Left detailed message per DPR regarding transmission. Transmission reviewed.   CorVue thoracic impedancesuggesting possible fluid accumulation since 08/03/2019 but is trending back to baseline.  Prescribed:Furosemide 20 mg 1 tablet daily  Recommendations:Left voice mail with ICM number and encouraged to call if experiencing any fluid symptoms.  Follow-up plan: ICM clinic phone appointment on1/19/2021 (manual send) to recheck fluid levels. 91 day device clinic remote transmission 10/01/2019.   Copy of ICM check sent to [name].  Updated Direct Trend Viewer 08/06/2019    3 month ICM trend: 08/04/2019    1 Year ICM trend:       Karie Soda, RN 08/08/2019 8:20 AM

## 2019-08-09 NOTE — Progress Notes (Signed)
ICD remote 

## 2019-08-12 ENCOUNTER — Ambulatory Visit: Payer: Self-pay

## 2019-08-12 DIAGNOSIS — I5022 Chronic systolic (congestive) heart failure: Secondary | ICD-10-CM

## 2019-08-12 DIAGNOSIS — Z95810 Presence of automatic (implantable) cardiac defibrillator: Secondary | ICD-10-CM

## 2019-08-15 NOTE — Progress Notes (Signed)
EPIC Encounter for ICM Monitoring  Patient Name: George Barber is a 59 y.o. male Date: 08/15/2019 Primary Care Physican: Bennie Pierini, FNP Primary Cardiologist:Taylor Electrophysiologist: Ladona Ridgel LastWeight: 146 lbs   Transmission reviewed.  CorVue thoracic impedance returned to baseline since 08/04/2019.  Prescribed:Furosemide 20 mg 1 tablet daily  Recommendations:None  Follow-up plan: ICM clinic phone appointment on2/15/2021. 91 day device clinic remote transmission3/04/2020.   Copy of ICM check sent to [name].  3 month ICM trend: 08/12/2019    1 Year ICM trend:       Karie Soda, RN 08/15/2019 4:08 PM

## 2019-09-24 NOTE — Progress Notes (Signed)
No ICM remote transmission received for 09/08/2019 and next ICM transmission scheduled for 10/13/2019.

## 2019-10-01 ENCOUNTER — Ambulatory Visit (INDEPENDENT_AMBULATORY_CARE_PROVIDER_SITE_OTHER): Payer: Self-pay | Admitting: *Deleted

## 2019-10-01 DIAGNOSIS — Z95810 Presence of automatic (implantable) cardiac defibrillator: Secondary | ICD-10-CM

## 2019-10-02 LAB — CUP PACEART REMOTE DEVICE CHECK
Battery Remaining Longevity: 31 mo
Battery Remaining Percentage: 30 %
Battery Voltage: 2.84 V
Brady Statistic RV Percent Paced: 1 %
Date Time Interrogation Session: 20210310184926
HighPow Impedance: 65 Ohm
HighPow Impedance: 65 Ohm
Implantable Lead Implant Date: 20120813
Implantable Lead Location: 753860
Implantable Lead Model: 180
Implantable Lead Serial Number: 303783
Implantable Pulse Generator Implant Date: 20120813
Lead Channel Impedance Value: 290 Ohm
Lead Channel Pacing Threshold Amplitude: 1.5 V
Lead Channel Pacing Threshold Pulse Width: 0.8 ms
Lead Channel Sensing Intrinsic Amplitude: 9.6 mV
Lead Channel Setting Pacing Amplitude: 2.5 V
Lead Channel Setting Pacing Pulse Width: 0.8 ms
Lead Channel Setting Sensing Sensitivity: 0.5 mV
Pulse Gen Serial Number: 1005776

## 2019-10-02 NOTE — Progress Notes (Signed)
ICD Remote  

## 2019-10-13 ENCOUNTER — Ambulatory Visit (INDEPENDENT_AMBULATORY_CARE_PROVIDER_SITE_OTHER): Payer: Self-pay

## 2019-10-13 DIAGNOSIS — Z95810 Presence of automatic (implantable) cardiac defibrillator: Secondary | ICD-10-CM

## 2019-10-13 DIAGNOSIS — I5022 Chronic systolic (congestive) heart failure: Secondary | ICD-10-CM

## 2019-10-14 ENCOUNTER — Telehealth: Payer: Self-pay

## 2019-10-14 NOTE — Telephone Encounter (Signed)
Remote ICM transmission received.  Attempted call to patient regarding ICM remote transmission and no answer.  

## 2019-10-14 NOTE — Progress Notes (Signed)
EPIC Encounter for ICM Monitoring  Patient Name: George Barber is a 59 y.o. male Date: 10/14/2019 Primary Care Physican: Bennie Pierini, FNP Primary Cardiologist:Taylor Electrophysiologist: Ladona Ridgel LastWeight: 146 lbs   Attempted call to patient and unable to reach.   Transmission reviewed.   CorVue thoracic impedance suggesting possible ongoing fluid accumulation since 10/12/2019.  Prescribed:Furosemide 20 mg 1 tablet daily  Recommendations:Unable to reach.    Follow-up plan: ICM clinic phone appointment on3/30/2021 to recheck fluid levels. 91 day device clinic remote transmission 12/31/2019.   Copy of ICM check sent to [name].  3 month ICM trend: 10/13/2019    1 Year ICM trend:       Karie Soda, RN 10/14/2019 8:41 AM

## 2019-10-21 ENCOUNTER — Ambulatory Visit (INDEPENDENT_AMBULATORY_CARE_PROVIDER_SITE_OTHER): Payer: Self-pay

## 2019-10-21 DIAGNOSIS — Z95810 Presence of automatic (implantable) cardiac defibrillator: Secondary | ICD-10-CM

## 2019-10-21 DIAGNOSIS — I5022 Chronic systolic (congestive) heart failure: Secondary | ICD-10-CM

## 2019-10-24 NOTE — Progress Notes (Signed)
EPIC Encounter for ICM Monitoring  Patient Name: George Barber is a 59 y.o. male Date: 10/24/2019 Primary Care Physican: Bennie Pierini, FNP Primary Cardiologist:Taylor Electrophysiologist: Ladona Ridgel LastWeight: 146 lbs   Transmission reviewed.   CorVue thoracic impedancereturned to normal.  Prescribed:Furosemide 20 mg 1 tablet daily  Recommendations: None   Follow-up plan: ICM clinic phone appointment on5/4/20221. 91 day device clinic remote transmission 12/31/2019.   Copy of ICM check sent to [name].  3 month ICM trend: 10/21/2019    1 Year ICM trend:       Karie Soda, RN 10/24/2019 3:45 PM

## 2019-11-25 ENCOUNTER — Ambulatory Visit (INDEPENDENT_AMBULATORY_CARE_PROVIDER_SITE_OTHER): Payer: Self-pay

## 2019-11-25 DIAGNOSIS — I5022 Chronic systolic (congestive) heart failure: Secondary | ICD-10-CM

## 2019-11-25 DIAGNOSIS — Z95810 Presence of automatic (implantable) cardiac defibrillator: Secondary | ICD-10-CM

## 2019-11-26 ENCOUNTER — Telehealth: Payer: Self-pay

## 2019-11-26 NOTE — Telephone Encounter (Signed)
Remote ICM transmission received.  Attempted call to patient regarding ICM remote transmission and left message per DPR to return call.   

## 2019-11-26 NOTE — Progress Notes (Signed)
EPIC Encounter for ICM Monitoring  Patient Name: George Barber is a 59 y.o. male Date: 11/26/2019 Primary Care Physican: Bennie Pierini, FNP Primary Cardiologist:Taylor Electrophysiologist: Ladona Ridgel LastWeight: 146 lbs   Attempted call to patient and unable to reach.  Left message to return call. Transmission reviewed.   CorVue thoracic impedancesuggesting possible fluid accumulation starting 11/23/19.  Impedance suggested possible dryness 4/25-5/1.  Prescribed:Furosemide 20 mg 1 tablet daily  Recommendations: Unable to reach.    Follow-up plan: ICM clinic phone appointment on5/13/2021 to recheck fluid levels. 91 day device clinic remote transmission6/03/2020.   Copy of ICM check sent to [name].  3 month ICM trend: 11/25/2019    1 Year ICM trend:       Karie Soda, RN 11/26/2019 10:18 AM

## 2019-12-01 ENCOUNTER — Other Ambulatory Visit: Payer: Self-pay | Admitting: Nurse Practitioner

## 2019-12-01 DIAGNOSIS — J441 Chronic obstructive pulmonary disease with (acute) exacerbation: Secondary | ICD-10-CM

## 2019-12-04 ENCOUNTER — Ambulatory Visit (INDEPENDENT_AMBULATORY_CARE_PROVIDER_SITE_OTHER): Payer: Self-pay

## 2019-12-04 DIAGNOSIS — I5022 Chronic systolic (congestive) heart failure: Secondary | ICD-10-CM

## 2019-12-04 DIAGNOSIS — Z95810 Presence of automatic (implantable) cardiac defibrillator: Secondary | ICD-10-CM

## 2019-12-05 ENCOUNTER — Telehealth: Payer: Self-pay

## 2019-12-05 NOTE — Progress Notes (Signed)
EPIC Encounter for ICM Monitoring  Patient Name: George Barber is a 59 y.o. male Date: 12/05/2019 Primary Care Physican: Bennie Pierini, FNP Primary Cardiologist:Taylor Electrophysiologist: Ladona Ridgel LastWeight: 146 lbs   Attempted call to patient and unable to reach.  Left message to return call. Transmission reviewed.   CorVue thoracic impedancecontinues to suggest possible fluid accumulation starting 11/23/19.  Impedance suggested possible dryness 4/25-5/1.  Prescribed:Furosemide 20 mg 1 tablet daily  Labs: 05/12/2019 Creatinine 0.93, BUN 11, Potassium 4.5, Sodium 140, GFR 91-105  Recommendations:Unable to reach.    Follow-up plan: ICM clinic phone appointment on5/20/2021 to recheck fluid levels.91 day device clinic remote transmission6/03/2020.   Copy of ICM check sent to [name].  3 month ICM trend: 12/04/2019    1 Year ICM trend:       Karie Soda, RN 12/05/2019 12:28 PM

## 2019-12-05 NOTE — Telephone Encounter (Signed)
Remote ICM transmission received.  Attempted call to patient regarding ICM remote transmission and left detailed message per DPR to return call.   

## 2019-12-11 ENCOUNTER — Ambulatory Visit: Payer: Self-pay

## 2019-12-11 DIAGNOSIS — I5022 Chronic systolic (congestive) heart failure: Secondary | ICD-10-CM

## 2019-12-11 DIAGNOSIS — Z95810 Presence of automatic (implantable) cardiac defibrillator: Secondary | ICD-10-CM

## 2019-12-12 NOTE — Progress Notes (Signed)
EPIC Encounter for ICM Monitoring  Patient Name: George Barber is a 59 y.o. male Date: 12/12/2019 Primary Care Physican: Bennie Pierini, FNP Primary Cardiologist:Taylor Electrophysiologist: Ladona Ridgel LastWeight: 146 lbs   Transmission reviewed.  CorVue thoracic impedancereturned to normal since 12/04/2019 remote transmission.  Prescribed:Furosemide 20 mg 1 tablet daily  Labs: 05/12/2019 Creatinine 0.93, BUN 11, Potassium 4.5, Sodium 140, GFR 91-105  Recommendations:None  Follow-up plan: ICM clinic phone appointment on6/04/2020.91 day device clinic remote transmission6/03/2020.   Copy of ICM check sent to [name].  3 month ICM trend: 12/11/2019    1 Year ICM trend:       George Soda, RN 12/12/2019 10:46 AM

## 2019-12-26 ENCOUNTER — Other Ambulatory Visit: Payer: Self-pay | Admitting: Nurse Practitioner

## 2019-12-26 DIAGNOSIS — I5022 Chronic systolic (congestive) heart failure: Secondary | ICD-10-CM

## 2019-12-31 ENCOUNTER — Ambulatory Visit (INDEPENDENT_AMBULATORY_CARE_PROVIDER_SITE_OTHER): Payer: Self-pay | Admitting: *Deleted

## 2019-12-31 DIAGNOSIS — I472 Ventricular tachycardia: Secondary | ICD-10-CM

## 2019-12-31 DIAGNOSIS — I4729 Other ventricular tachycardia: Secondary | ICD-10-CM

## 2020-01-01 ENCOUNTER — Ambulatory Visit: Payer: Self-pay

## 2020-01-01 DIAGNOSIS — Z95810 Presence of automatic (implantable) cardiac defibrillator: Secondary | ICD-10-CM

## 2020-01-01 DIAGNOSIS — I5022 Chronic systolic (congestive) heart failure: Secondary | ICD-10-CM

## 2020-01-01 LAB — CUP PACEART REMOTE DEVICE CHECK
Battery Remaining Longevity: 31 mo
Battery Remaining Longevity: 31 mo
Battery Remaining Percentage: 30 %
Battery Remaining Percentage: 30 %
Battery Voltage: 2.84 V
Battery Voltage: 2.84 V
Brady Statistic RV Percent Paced: 1 %
Brady Statistic RV Percent Paced: 1 %
Date Time Interrogation Session: 20210609202726
Date Time Interrogation Session: 20210610020022
HighPow Impedance: 64 Ohm
HighPow Impedance: 64 Ohm
HighPow Impedance: 70 Ohm
HighPow Impedance: 70 Ohm
Implantable Lead Implant Date: 20120813
Implantable Lead Implant Date: 20120813
Implantable Lead Location: 753860
Implantable Lead Location: 753860
Implantable Lead Model: 180
Implantable Lead Model: 180
Implantable Lead Serial Number: 303783
Implantable Lead Serial Number: 303783
Implantable Pulse Generator Implant Date: 20120813
Implantable Pulse Generator Implant Date: 20120813
Lead Channel Impedance Value: 310 Ohm
Lead Channel Impedance Value: 310 Ohm
Lead Channel Pacing Threshold Amplitude: 1.5 V
Lead Channel Pacing Threshold Amplitude: 1.5 V
Lead Channel Pacing Threshold Pulse Width: 0.8 ms
Lead Channel Pacing Threshold Pulse Width: 0.8 ms
Lead Channel Sensing Intrinsic Amplitude: 11.7 mV
Lead Channel Sensing Intrinsic Amplitude: 9 mV
Lead Channel Setting Pacing Amplitude: 2.5 V
Lead Channel Setting Pacing Amplitude: 2.5 V
Lead Channel Setting Pacing Pulse Width: 0.8 ms
Lead Channel Setting Pacing Pulse Width: 0.8 ms
Lead Channel Setting Sensing Sensitivity: 0.5 mV
Lead Channel Setting Sensing Sensitivity: 0.5 mV
Pulse Gen Serial Number: 1005776
Pulse Gen Serial Number: 1005776

## 2020-01-02 NOTE — Progress Notes (Signed)
EPIC Encounter for ICM Monitoring  Patient Name: George Barber is a 59 y.o. male Date: 01/02/2020 Primary Care Physican: Bennie Pierini, FNP Primary Cardiologist:Taylor Electrophysiologist: Ladona Ridgel LastWeight: 146 lbs   Transmission reviewed.  CorVue thoracic impedancenormal.  Prescribed:Furosemide 20 mg 1 tablet daily  Labs: 05/12/2019 Creatinine 0.93, BUN 11, Potassium 4.5, Sodium 140, GFR 91-105  Recommendations:None  Follow-up plan: ICM clinic phone appointment on6/04/2020.91 day device clinic remote transmission6/03/2020.   Copy of ICM check sent to [name].  3 month ICM trend: 01/01/2020    1 Year ICM trend:       Karie Soda, RN 01/02/2020 10:19 AM

## 2020-01-05 NOTE — Progress Notes (Signed)
Remote ICD transmission.   

## 2020-01-27 ENCOUNTER — Other Ambulatory Visit: Payer: Self-pay | Admitting: Nurse Practitioner

## 2020-01-27 DIAGNOSIS — I5022 Chronic systolic (congestive) heart failure: Secondary | ICD-10-CM

## 2020-01-28 NOTE — Telephone Encounter (Signed)
Left detailed message will need to set up an appointment

## 2020-01-28 NOTE — Telephone Encounter (Signed)
MMM NTBS 30 days given 12/26/19

## 2020-01-30 ENCOUNTER — Other Ambulatory Visit: Payer: Self-pay | Admitting: Nurse Practitioner

## 2020-01-30 ENCOUNTER — Telehealth: Payer: Self-pay | Admitting: Internal Medicine

## 2020-01-30 DIAGNOSIS — I5022 Chronic systolic (congestive) heart failure: Secondary | ICD-10-CM

## 2020-01-30 MED ORDER — FUROSEMIDE 20 MG PO TABS: 20.0000 mg | ORAL_TABLET | Freq: Every day | ORAL | 0 refills | Status: DC

## 2020-01-30 MED ORDER — SPIRONOLACTONE 25 MG PO TABS: 25.0000 mg | ORAL_TABLET | Freq: Every day | ORAL | 0 refills | Status: DC

## 2020-01-30 NOTE — Telephone Encounter (Signed)
   Pt wanted to refill fluid pill/ advised both fluid pills prescribe by PCP. He needs to call them. Pt understood

## 2020-01-30 NOTE — Telephone Encounter (Signed)
  Prescription Request  01/30/2020  What is the name of the medication or equipment? furosemide (LASIX) 20 MG tablet and spironolactone (ALDACTONE) 25 MG tablet     Have you contacted your pharmacy to request a refill? (if applicable) no, pt wanting to know if enough to last until his appt next Wednesday can be sent in as he is out of both.   Which pharmacy would you like this sent to? [facility] in [place]   Patient notified that their request is being sent to the clinical staff for review and that they should receive a response within 2 business days.

## 2020-01-30 NOTE — Addendum Note (Signed)
Addended by: Hessie Diener on: 01/30/2020 02:16 PM   Modules accepted: Orders

## 2020-01-30 NOTE — Telephone Encounter (Signed)
Left message stating requested rx sent into the pharmacy and to make sure to be at appt next week as we can't do any further refills without him being seen in office.

## 2020-02-02 ENCOUNTER — Ambulatory Visit (INDEPENDENT_AMBULATORY_CARE_PROVIDER_SITE_OTHER): Payer: Self-pay

## 2020-02-02 DIAGNOSIS — Z95810 Presence of automatic (implantable) cardiac defibrillator: Secondary | ICD-10-CM

## 2020-02-02 DIAGNOSIS — I5022 Chronic systolic (congestive) heart failure: Secondary | ICD-10-CM

## 2020-02-03 NOTE — Progress Notes (Signed)
EPIC Encounter for ICM Monitoring  Patient Name: George Barber is a 59 y.o. male Date: 02/03/2020 Primary Care Physican: Bennie Pierini, FNP Primary Cardiologist:Taylor Electrophysiologist: Ladona Ridgel 02/03/2020 Weight: 142 lbs   Spoke with patient and reports feeling well at this time.  Denies fluid symptoms.  He said he uses salt on fresh tomatoes in the summer.  CorVue thoracic impedancenormal but was suggesting possible fluid accumulation 7/8-7/11.  Prescribed:Furosemide 20 mg 1 tablet daily  Labs: 05/12/2019 Creatinine 0.93, BUN 11, Potassium 4.5, Sodium 140, GFR 91-105  Recommendations:No changes and encouraged to call if experiencing any fluid symptoms.  Follow-up plan: ICM clinic phone appointment on8/16/2021.91 day device clinic remote transmission6/03/2020.   Copy of ICM check sent to [name].  Follow-up plan: ICM clinic phone appointment on 03/08/2020.   91 day device clinic remote transmission 03/31/2020.    EP/Cardiology Office Visits: Recall 05/11/2020 with Dr. Ladona Ridgel.    Copy of ICM check sent to Dr. Ladona Ridgel.   3 month ICM trend: 02/02/2020    1 Year ICM trend:       Karie Soda, RN 02/03/2020 2:15 PM

## 2020-02-04 ENCOUNTER — Other Ambulatory Visit: Payer: Self-pay

## 2020-02-04 ENCOUNTER — Encounter: Payer: Self-pay | Admitting: Nurse Practitioner

## 2020-02-04 ENCOUNTER — Ambulatory Visit (INDEPENDENT_AMBULATORY_CARE_PROVIDER_SITE_OTHER): Payer: Commercial Managed Care - PPO | Admitting: Nurse Practitioner

## 2020-02-04 VITALS — BP 136/76 | HR 56 | Temp 98.2°F | Resp 20 | Ht 70.0 in | Wt 142.0 lb

## 2020-02-04 DIAGNOSIS — I5022 Chronic systolic (congestive) heart failure: Secondary | ICD-10-CM

## 2020-02-04 DIAGNOSIS — I472 Ventricular tachycardia: Secondary | ICD-10-CM | POA: Diagnosis not present

## 2020-02-04 DIAGNOSIS — J411 Mucopurulent chronic bronchitis: Secondary | ICD-10-CM | POA: Diagnosis not present

## 2020-02-04 DIAGNOSIS — I429 Cardiomyopathy, unspecified: Secondary | ICD-10-CM

## 2020-02-04 DIAGNOSIS — Z95810 Presence of automatic (implantable) cardiac defibrillator: Secondary | ICD-10-CM

## 2020-02-04 DIAGNOSIS — I4729 Other ventricular tachycardia: Secondary | ICD-10-CM

## 2020-02-04 DIAGNOSIS — F1011 Alcohol abuse, in remission: Secondary | ICD-10-CM

## 2020-02-04 MED ORDER — SPIRONOLACTONE 25 MG PO TABS: 25.0000 mg | ORAL_TABLET | Freq: Every day | ORAL | 0 refills | Status: DC

## 2020-02-04 MED ORDER — CARVEDILOL 12.5 MG PO TABS: 12.5000 mg | ORAL_TABLET | Freq: Two times a day (BID) | ORAL | 3 refills | Status: DC

## 2020-02-04 MED ORDER — ANORO ELLIPTA 62.5-25 MCG/INH IN AEPB: INHALATION_SPRAY | RESPIRATORY_TRACT | 1 refills | Status: DC

## 2020-02-04 MED ORDER — FUROSEMIDE 20 MG PO TABS: 20.0000 mg | ORAL_TABLET | Freq: Every day | ORAL | 0 refills | Status: DC

## 2020-02-04 NOTE — Progress Notes (Signed)
Subjective:    Patient ID: [name], male    DOB: December 29, 1960, 59 y.o.   MRN: [id number]   Chief Complaint: Medical Management of Chronic Issues    HPI:  1. Chronic systolic heart failure (HCC) Does follow-up with cardiologist regularly, annually. Last saw cardiologist in October- according t o office note no changes were made to plan of care  2. Cardiomyopathy, secondary [facility]) Does see a cardiologist regularly, annually.  3. NSVT (nonsustained ventricular tachycardia) (HCC) No feelings of sob or heart racing. Sees a cardiologist annually.   4. Mucopurulent chronic bronchitis (HCC) Reports no trouble breathing as long as he has his inhaler. Is on anoro daily  5. History of ETOH abuse Drinks 40 oz of beer daily  6. Automatic implantable cardioverter-defibrillator in situ Has not felt his defibrillator go off.     Outpatient Encounter Medications as of 02/04/2020  Medication Sig   albuterol (VENTOLIN HFA) 108 (90 Base) MCG/ACT inhaler INHALE 2 PUFFS BY MOUTH EVERY 6 HOURS AS NEEDED FOR WHEEZING AND FOR SHORTNESS OF BREATH.  Needs to be seen for further refills.   amiodarone (PACERONE) 200 MG tablet Take 1 tablet (200 mg total) by mouth daily.   aspirin 81 MG tablet Take 81 mg by mouth daily.     carvedilol (COREG) 12.5 MG tablet Take 1 tablet (12.5 mg total) by mouth 2 (two) times daily with a meal. TAKE 1 TABLET BY MOUTH TWICE DAILY WITH MEALS . APPOINTMENT REQUIRED FOR FUTURE REFILLS   furosemide (LASIX) 20 MG tablet Take 1 tablet (20 mg total) by mouth daily. (Needs to be seen before next refill)   losartan (COZAAR) 100 MG tablet Take 0.5 tablets (50 mg total) by mouth daily.   spironolactone (ALDACTONE) 25 MG tablet Take 1 tablet (25 mg total) by mouth daily. (Needs to be seen before next refill)   umeclidinium-vilanterol (ANORO ELLIPTA) 62.5-25 MCG/INH AEPB Inhale 1 puff by mouth once daily   No facility-administered encounter medications on file as of  02/04/2020.    Past Surgical History:  Procedure Laterality Date   2D ECHOCARDIOGRAM  02/2011   CARDIAC CATHETERIZATION      Family History  Problem Relation Age of Onset   Cancer Mother 58   Coronary artery disease Father        unknown   Emphysema Father     New complaints: No new complaints.   Social history: Lives at home with his wife. Works 6 days per week, 45 hours.   Controlled substance contract: n/a     Review of Systems  Constitutional: Negative.   HENT: Negative.   Eyes: Negative.   Respiratory: Negative.   Cardiovascular: Negative.   Gastrointestinal: Negative.   Endocrine: Negative.   Genitourinary: Negative.   Musculoskeletal: Negative.   Skin: Negative.   Allergic/Immunologic: Negative.   Neurological: Negative.   Hematological: Negative.   Psychiatric/Behavioral: Negative.        Objective:   Physical Exam Vitals reviewed.  Constitutional:      Appearance: Normal appearance. He is normal weight.  HENT:     Head: Normocephalic and atraumatic.     Right Ear: Tympanic membrane, ear canal and external ear normal.     Left Ear: Tympanic membrane, ear canal and external ear normal. There is impacted cerumen.     Nose: Nose normal.     Mouth/Throat:     Mouth: Mucous membranes are moist.     Pharynx: Oropharynx is clear.  Eyes:  Extraocular Movements: Extraocular movements intact.     Conjunctiva/sclera: Conjunctivae normal.     Pupils: Pupils are equal, round, and reactive to light.  Cardiovascular:     Rate and Rhythm: Normal rate and regular rhythm.     Pulses: Normal pulses.     Heart sounds: Normal heart sounds.  Pulmonary:     Effort: Pulmonary effort is normal.     Breath sounds: Normal breath sounds.  Abdominal:     General: Abdomen is flat. Bowel sounds are normal.     Palpations: Abdomen is soft.  Genitourinary:    Penis: Normal.      Testes: Normal.     Prostate: Normal.     Rectum: Normal.  Musculoskeletal:          General: Normal range of motion.     Cervical back: Normal range of motion and neck supple.  Skin:    General: Skin is warm and dry.     Capillary Refill: Capillary refill takes less than 2 seconds.  Neurological:     General: No focal deficit present.     Mental Status: He is alert and oriented to person, place, and time. Mental status is at baseline.  Psychiatric:        Mood and Affect: Mood normal.        Behavior: Behavior normal.        Thought Content: Thought content normal.        Judgment: Judgment normal.   BP 136/76    Pulse (!) 56    Temp 98.2 F (36.8 C) (Temporal)    Resp 20    Ht 5\' 10"  (1.778 m)    Wt 142 lb (64.4 kg)    SpO2 97%    BMI 20.37 kg/m   s/p left ear lavage- tympanic membrane clear    Assessment & Plan:  George Barber comes in today with chief complaint of Medical Management of Chronic Issues   Diagnosis and orders addressed:  1. Chronic systolic heart failure [facility]) Patient encouraged to follow-up with a cardiologist regularly and maintain current therapeutic regimen.   2. Cardiomyopathy, secondary (HCC) Patient encouraged to follow-up with a cardiologist regularly and maintain current therapeutic regimen.   3. NSVT (nonsustained ventricular tachycardia) (HCC) Patient encouraged to follow-up with a cardiologist regularly and maintain current therapeutic regimen.   4. Mucopurulent chronic bronchitis (HCC) Patient encouraged to avoid stressors and follow-up with a pulmonologist regularly.   5. History of ETOH abuse Patient encouraged to avoid alcohol and attend AA meetings if applicable. Instructed on ways to manages stress in his life.   6. Automatic implantable cardioverter-defibrillator in situ Patient encouraged to follow-up with a cardiologist regularly and maintain current therapeutic regimen. Also encouraged to report when he feels the defibrillator fire, and call 911.   7. Cerumen impaction Debrox in ear 2-3 x a week Do not stick  anything in ear  Labs pending Health Maintenance reviewed Diet and exercise encouraged  Follow up plan: Follow-up in 6 months    [facility], FNP Daphine Deutscher, FNP Student

## 2020-02-04 NOTE — Patient Instructions (Signed)
Earwax Buildup, Adult The ears produce a substance called earwax that helps keep bacteria out of the ear and protects the skin in the ear canal. Occasionally, earwax can build up in the ear and cause discomfort or hearing loss. What increases the risk? This condition is more likely to develop in people who:  Are male.  Are elderly.  Naturally produce more earwax.  Clean their ears often with cotton swabs.  Use earplugs often.  Use in-ear headphones often.  Wear hearing aids.  Have narrow ear canals.  Have earwax that is overly thick or sticky.  Have eczema.  Are dehydrated.  Have excess hair in the ear canal. What are the signs or symptoms? Symptoms of this condition include:  Reduced or muffled hearing.  A feeling of fullness in the ear or feeling that the ear is plugged.  Fluid coming from the ear.  Ear pain.  Ear itch.  Ringing in the ear.  Coughing.  An obvious piece of earwax that can be seen inside the ear canal. How is this diagnosed? This condition may be diagnosed based on:  Your symptoms.  Your medical history.  An ear exam. During the exam, your health care provider will look into your ear with an instrument called an otoscope. You may have tests, including a hearing test. How is this treated? This condition may be treated by:  Using ear drops to soften the earwax.  Having the earwax removed by a health care provider. The health care provider may: ? Flush the ear with water. ? Use an instrument that has a loop on the end (curette). ? Use a suction device.  Surgery to remove the wax buildup. This may be done in severe cases. Follow these instructions at home:   Take over-the-counter and prescription medicines only as told by your health care provider.  Do not put any objects, including cotton swabs, into your ear. You can clean the opening of your ear canal with a washcloth or facial tissue.  Follow instructions from your health care  provider about cleaning your ears. Do not over-clean your ears.  Drink enough fluid to keep your urine clear or pale yellow. This will help to thin the earwax.  Keep all follow-up visits as told by your health care provider. If earwax builds up in your ears often or if you use hearing aids, consider seeing your health care provider for routine, preventive ear cleanings. Ask your health care provider how often you should schedule your cleanings.  If you have hearing aids, clean them according to instructions from the manufacturer and your health care provider. Contact a health care provider if:  You have ear pain.  You develop a fever.  You have blood, pus, or other fluid coming from your ear.  You have hearing loss.  You have ringing in your ears that does not go away.  Your symptoms do not improve with treatment.  You feel like the room is spinning (vertigo). Summary  Earwax can build up in the ear and cause discomfort or hearing loss.  The most common symptoms of this condition include reduced or muffled hearing and a feeling of fullness in the ear or feeling that the ear is plugged.  This condition may be diagnosed based on your symptoms, your medical history, and an ear exam.  This condition may be treated by using ear drops to soften the earwax or by having the earwax removed by a health care provider.  Do not put any   objects, including cotton swabs, into your ear. You can clean the opening of your ear canal with a washcloth or facial tissue. This information is not intended to replace advice given to you by your health care provider. Make sure you discuss any questions you have with your health care provider. Document Revised: 06/22/2017 Document Reviewed: 09/20/2016 Elsevier Patient Education  2020 Elsevier Inc.  

## 2020-02-04 NOTE — Addendum Note (Signed)
Addended by: Caeson Filippi, MARY-MARGARET on: 02/04/2020 02:26 PM   Modules accepted: Orders  

## 2020-02-05 LAB — CBC WITH DIFFERENTIAL/PLATELET
Basophils Absolute: 0.1 10*3/uL (ref 0.0–0.2)
Basos: 1 %
EOS (ABSOLUTE): 0.4 10*3/uL (ref 0.0–0.4)
Eos: 5 %
Hematocrit: 43 % (ref 37.5–51.0)
Hemoglobin: 15.2 g/dL (ref 13.0–17.7)
Immature Grans (Abs): 0 10*3/uL (ref 0.0–0.1)
Immature Granulocytes: 1 %
Lymphocytes Absolute: 2.3 10*3/uL (ref 0.7–3.1)
Lymphs: 27 %
MCH: 35.9 pg — ABNORMAL HIGH (ref 26.6–33.0)
MCHC: 35.3 g/dL (ref 31.5–35.7)
MCV: 102 fL — ABNORMAL HIGH (ref 79–97)
Monocytes Absolute: 0.8 10*3/uL (ref 0.1–0.9)
Monocytes: 10 %
Neutrophils Absolute: 4.8 10*3/uL (ref 1.4–7.0)
Neutrophils: 56 %
Platelets: 317 10*3/uL (ref 150–450)
RBC: 4.23 x10E6/uL (ref 4.14–5.80)
RDW: 12.1 % (ref 11.6–15.4)
WBC: 8.5 10*3/uL (ref 3.4–10.8)

## 2020-02-05 LAB — CMP14+EGFR
ALT: 30 IU/L (ref 0–44)
AST: 29 IU/L (ref 0–40)
Albumin/Globulin Ratio: 2.2 (ref 1.2–2.2)
Albumin: 4.4 g/dL (ref 3.8–4.9)
Alkaline Phosphatase: 61 IU/L (ref 48–121)
BUN/Creatinine Ratio: 11 (ref 9–20)
BUN: 11 mg/dL (ref 6–24)
Bilirubin Total: <0.2 mg/dL (ref 0.0–1.2)
CO2: 25 mmol/L (ref 20–29)
Calcium: 9.7 mg/dL (ref 8.7–10.2)
Chloride: 101 mmol/L (ref 96–106)
Creatinine, Ser: 0.99 mg/dL (ref 0.76–1.27)
GFR calc Af Amer: 97 mL/min/{1.73_m2} (ref >59)
GFR calc non Af Amer: 84 mL/min/{1.73_m2} (ref >59)
Globulin, Total: 2 g/dL (ref 1.5–4.5)
Glucose: 77 mg/dL (ref 65–99)
Potassium: 4.3 mmol/L (ref 3.5–5.2)
Sodium: 141 mmol/L (ref 134–144)
Total Protein: 6.4 g/dL (ref 6.0–8.5)

## 2020-02-05 LAB — LIPID PANEL
Chol/HDL Ratio: 2.4 ratio (ref 0.0–5.0)
Cholesterol, Total: 158 mg/dL (ref 100–199)
HDL: 66 mg/dL (ref >39)
LDL Chol Calc (NIH): 77 mg/dL (ref 0–99)
Triglycerides: 81 mg/dL (ref 0–149)
VLDL Cholesterol Cal: 15 mg/dL (ref 5–40)

## 2020-02-09 ENCOUNTER — Telehealth: Payer: Self-pay | Admitting: Nurse Practitioner

## 2020-02-09 NOTE — Telephone Encounter (Signed)
Aware of results. 

## 2020-02-09 NOTE — Telephone Encounter (Signed)
Returning call for lab results °

## 2020-03-08 ENCOUNTER — Ambulatory Visit (INDEPENDENT_AMBULATORY_CARE_PROVIDER_SITE_OTHER): Payer: Self-pay

## 2020-03-08 DIAGNOSIS — Z95810 Presence of automatic (implantable) cardiac defibrillator: Secondary | ICD-10-CM

## 2020-03-08 DIAGNOSIS — I5022 Chronic systolic (congestive) heart failure: Secondary | ICD-10-CM

## 2020-03-09 NOTE — Progress Notes (Signed)
EPIC Encounter for ICM Monitoring  Patient Name: George Barber is a 59 y.o. male Date: 03/09/2020 Primary Care Physican: Bennie Pierini, FNP Primary Cardiologist:Taylor Electrophysiologist: Ladona Ridgel 02/03/2020 Weight: 142 lbs   Attempted call to patient and unable to reach.  Left message to return call. Transmission reviewed.   CorVue thoracic impedancesuggesting normal fluid levels.  Prescribed:Furosemide 20 mg 1 tablet daily  Labs: 05/12/2019 Creatinine 0.93, BUN 11, Potassium 4.5, Sodium 140, GFR 91-105  Recommendations:Unable to reach.    Follow-up plan: ICM clinic phone appointment on 04/12/2020.   91 day device clinic remote transmission 03/31/2020.    EP/Cardiology Office Visits: Recall 05/11/2020 with Dr. Ladona Ridgel.    Copy of ICM check sent to Dr. Ladona Ridgel.    3 month ICM trend: 03/08/2020    1 Year ICM trend:       Karie Soda, RN 03/09/2020 4:51 PM

## 2020-03-31 ENCOUNTER — Ambulatory Visit (INDEPENDENT_AMBULATORY_CARE_PROVIDER_SITE_OTHER): Payer: Self-pay | Admitting: *Deleted

## 2020-03-31 DIAGNOSIS — I429 Cardiomyopathy, unspecified: Secondary | ICD-10-CM

## 2020-03-31 LAB — CUP PACEART REMOTE DEVICE CHECK
Battery Remaining Longevity: 28 mo
Battery Remaining Percentage: 27 %
Battery Voltage: 2.83 V
Brady Statistic RV Percent Paced: 1 %
Date Time Interrogation Session: 20210908040021
HighPow Impedance: 64 Ohm
HighPow Impedance: 64 Ohm
Implantable Lead Implant Date: 20120813
Implantable Lead Location: 753860
Implantable Lead Model: 180
Implantable Lead Serial Number: 303783
Implantable Pulse Generator Implant Date: 20120813
Lead Channel Impedance Value: 280 Ohm
Lead Channel Pacing Threshold Amplitude: 1.5 V
Lead Channel Pacing Threshold Pulse Width: 0.8 ms
Lead Channel Sensing Intrinsic Amplitude: 9.7 mV
Lead Channel Setting Pacing Amplitude: 2.5 V
Lead Channel Setting Pacing Pulse Width: 0.8 ms
Lead Channel Setting Sensing Sensitivity: 0.5 mV
Pulse Gen Serial Number: 1005776

## 2020-04-01 NOTE — Progress Notes (Signed)
Remote ICD transmission.   

## 2020-04-03 ENCOUNTER — Other Ambulatory Visit: Payer: Self-pay | Admitting: Nurse Practitioner

## 2020-04-03 DIAGNOSIS — I5022 Chronic systolic (congestive) heart failure: Secondary | ICD-10-CM

## 2020-04-12 ENCOUNTER — Ambulatory Visit (INDEPENDENT_AMBULATORY_CARE_PROVIDER_SITE_OTHER): Payer: Self-pay

## 2020-04-12 DIAGNOSIS — I5022 Chronic systolic (congestive) heart failure: Secondary | ICD-10-CM

## 2020-04-12 DIAGNOSIS — Z95810 Presence of automatic (implantable) cardiac defibrillator: Secondary | ICD-10-CM

## 2020-04-14 NOTE — Progress Notes (Signed)
EPIC Encounter for ICM Monitoring  Patient Name: George Barber is a 59 y.o. male Date: 04/14/2020 Primary Care Physican: Bennie Pierini, FNP Primary Cardiologist:Taylor Electrophysiologist: Ladona Ridgel 7/13/2021Weight: 142lbs   Transmission reviewed.   CorVue thoracic impedancesuggesting normal fluid levels.  Prescribed:Furosemide 20 mg 1 tablet daily  Labs: 05/12/2019 Creatinine 0.93, BUN 11, Potassium 4.5, Sodium 140, GFR 91-105  Recommendations:No changes  Follow-up plan: ICM clinic phone appointment on10/25/2021.91 day device clinic remote transmission 06/30/2020.   EP/Cardiology Office Visits:Recall 05/11/2020 with [name].   Copy of ICM check sent to [name].    3 month ICM trend: 04/12/2020    1 Year ICM trend:       Karie Soda, RN 04/14/2020 10:44 AM

## 2020-05-17 ENCOUNTER — Ambulatory Visit (INDEPENDENT_AMBULATORY_CARE_PROVIDER_SITE_OTHER): Payer: Self-pay

## 2020-05-17 DIAGNOSIS — Z95810 Presence of automatic (implantable) cardiac defibrillator: Secondary | ICD-10-CM

## 2020-05-17 DIAGNOSIS — I5022 Chronic systolic (congestive) heart failure: Secondary | ICD-10-CM

## 2020-05-19 NOTE — Progress Notes (Signed)
EPIC Encounter for ICM Monitoring  Patient Name: George Barber is a 59 y.o. male Date: 05/19/2020 Primary Care Physican: Bennie Pierini, FNP Primary Cardiologist:Taylor Electrophysiologist: Ladona Ridgel 10/27/2021Weight: 142lbs   Spoke with patient and reports feeling well at this time.  Denies fluid symptoms.    CorVue thoracic impedancesuggestingnormalfluid levels.  Prescribed:Furosemide 20 mg 1 tablet daily  Labs: 05/12/2019 Creatinine 0.93, BUN 11, Potassium 4.5, Sodium 140, GFR 91-105  Recommendations:No changes and encouraged to call if experiencing any fluid symptoms.  Follow-up plan: ICM clinic phone appointment on11/29/2021.91 day device clinic remote transmission 06/30/2020.   EP/Cardiology Office Visits:Recall 05/11/2020 with [name].  Message sent to EP scheduler to call patient for appointment.  Copy of ICM check sent to [name].   3 month ICM trend: 05/17/2020    1 Year ICM trend:       Karie Soda, RN 05/19/2020 12:37 PM

## 2020-05-25 ENCOUNTER — Other Ambulatory Visit: Payer: Self-pay | Admitting: Nurse Practitioner

## 2020-05-25 DIAGNOSIS — J441 Chronic obstructive pulmonary disease with (acute) exacerbation: Secondary | ICD-10-CM

## 2020-06-01 ENCOUNTER — Other Ambulatory Visit: Payer: Self-pay | Admitting: Internal Medicine

## 2020-06-02 MED ORDER — AMIODARONE HCL 200 MG PO TABS
200.0000 mg | ORAL_TABLET | Freq: Every day | ORAL | 0 refills | Status: DC
Start: 2020-06-02 — End: 2020-06-08

## 2020-06-02 MED ORDER — LOSARTAN POTASSIUM 100 MG PO TABS
50.0000 mg | ORAL_TABLET | Freq: Every day | ORAL | 0 refills | Status: DC
Start: 2020-06-02 — End: 2020-09-09

## 2020-06-08 ENCOUNTER — Encounter: Payer: Self-pay | Admitting: Internal Medicine

## 2020-06-08 ENCOUNTER — Other Ambulatory Visit: Payer: Self-pay

## 2020-06-08 ENCOUNTER — Ambulatory Visit (INDEPENDENT_AMBULATORY_CARE_PROVIDER_SITE_OTHER): Payer: Commercial Managed Care - PPO | Admitting: Internal Medicine

## 2020-06-08 VITALS — BP 164/88 | HR 62 | Ht 70.0 in | Wt 139.4 lb

## 2020-06-08 DIAGNOSIS — I429 Cardiomyopathy, unspecified: Secondary | ICD-10-CM | POA: Diagnosis not present

## 2020-06-08 DIAGNOSIS — I472 Ventricular tachycardia: Secondary | ICD-10-CM

## 2020-06-08 DIAGNOSIS — I5022 Chronic systolic (congestive) heart failure: Secondary | ICD-10-CM

## 2020-06-08 DIAGNOSIS — I4729 Other ventricular tachycardia: Secondary | ICD-10-CM

## 2020-06-08 DIAGNOSIS — Z95810 Presence of automatic (implantable) cardiac defibrillator: Secondary | ICD-10-CM

## 2020-06-08 LAB — CUP PACEART INCLINIC DEVICE CHECK
Battery Remaining Longevity: 26 mo
Brady Statistic RV Percent Paced: 0.07 %
Date Time Interrogation Session: 20211116121307
HighPow Impedance: 70.875
Implantable Lead Implant Date: 20120813
Implantable Lead Location: 753860
Implantable Lead Model: 180
Implantable Lead Serial Number: 303783
Implantable Pulse Generator Implant Date: 20120813
Lead Channel Impedance Value: 312.5 Ohm
Lead Channel Pacing Threshold Amplitude: 1.5 V
Lead Channel Pacing Threshold Pulse Width: 0.8 ms
Lead Channel Sensing Intrinsic Amplitude: 11.8 mV
Lead Channel Setting Pacing Amplitude: 2.5 V
Lead Channel Setting Pacing Pulse Width: 0.8 ms
Lead Channel Setting Sensing Sensitivity: 0.5 mV
Pulse Gen Serial Number: 1005776

## 2020-06-08 MED ORDER — AMIODARONE HCL 200 MG PO TABS
200.0000 mg | ORAL_TABLET | Freq: Every day | ORAL | 0 refills | Status: DC
Start: 2020-06-08 — End: 2020-11-22

## 2020-06-08 NOTE — Patient Instructions (Addendum)
Medication Instructions:  Your physician has recommended you make the following change in your medication:   Decrease your Amiodarone to 1 tablet daily Monday - Saturday.  *If you need a refill on your cardiac medications before your next appointment, please call your pharmacy*   Lab Work: None ordered.  If you have labs (blood work) drawn today and your tests are completely normal, you will receive your results only by: [name] Kitchen MyChart Message (if you have MyChart) OR . A paper copy in the mail If you have any lab test that is abnormal or we need to change your treatment, we will call you to review the results.   Testing/Procedures: None ordered.    Follow-Up: At [facility], you and your health needs are our priority.  As part of our continuing mission to provide you with exceptional heart care, we have created designated Provider Care Teams.  These Care Teams include your primary Cardiologist (physician) and Advanced Practice Providers (APPs -  Physician Assistants and Nurse Practitioners) who all work together to provide you with the care you need, when you need it.  We recommend signing up for the patient portal called "MyChart".  Sign up information is provided on this After Visit Summary.  MyChart is used to connect with patients for Virtual Visits (Telemedicine).  Patients are able to view lab/test results, encounter notes, upcoming appointments, etc.  Non-urgent messages can be sent to your provider as well.   To learn more about what you can do with MyChart, go to [email or web address].    Your next appointment:   12 month(s)  The format for your next appointment:   In Person  Provider:   Casimiro Needle "[name], PA-C   Other Instructions You will see Dr Ladona Ridgel again in 2 years

## 2020-06-08 NOTE — Progress Notes (Signed)
HPI GeorgeDossreturns today for followup. He is a very pleasant 59 year old man with a history of ventricular tachycardia, status post ICD insertion. He has a nonischemic cardiomyopathy with chronic systolic heart failure currently class II. In the interim, he has been stable.He denies chest pain or syncope. No peripheral edema. No ICD shocks. He is still working and also helps take care of his grandchildren.   No Known Allergies   Current Outpatient Medications  Medication Sig Dispense Refill  . albuterol (VENTOLIN HFA) 108 (90 Base) MCG/ACT inhaler INHALE 2 PUFFS BY MOUTH EVERY 6 HOURS AS NEEDED FOR WHEEZING AND FOR SHORTNESS OF BREATH . 18 g 0  . amiodarone (PACERONE) 200 MG tablet Take 1 tablet (200 mg total) by mouth daily. Please keep upcoming appt in November with Dr. Ladona Ridgel before anymore refills. Thank you 90 tablet 0  . aspirin 81 MG tablet Take 81 mg by mouth daily.      . carvedilol (COREG) 12.5 MG tablet Take 1 tablet (12.5 mg total) by mouth 2 (two) times daily with a meal. TAKE 1 TABLET BY MOUTH TWICE DAILY WITH MEALS . APPOINTMENT REQUIRED FOR FUTURE REFILLS 180 tablet 3  . furosemide (LASIX) 20 MG tablet Take 1 tablet (20 mg total) by mouth daily. 30 tablet 2  . losartan (COZAAR) 100 MG tablet Take 0.5 tablets (50 mg total) by mouth daily. Please keep upcoming appt in November with Dr. Ladona Ridgel before anymore refills. Thank you 45 tablet 0  . spironolactone (ALDACTONE) 25 MG tablet Take 1 tablet (25 mg total) by mouth daily. 30 tablet 2  . umeclidinium-vilanterol (ANORO ELLIPTA) 62.5-25 MCG/INH AEPB Inhale 1 puff by mouth once daily 180 each 1   No current facility-administered medications for this visit.     Past Medical History:  Diagnosis Date  . Elevated LFTs   . Emphysema   . Fluttering heart   . NICM (nonischemic cardiomyopathy) (HCC)    cath 12/13/10: Normal cors, EF 10-15%;  b. echo 5/12 EF 15%, mild MR, mod LAE, mild RVE, mild to mod RAE, mild to mod TR,  PASP 44  . NSVT (nonsustained ventricular tachycardia) (HCC)    Life Vest; amiodarone rx  . Systolic CHF, chronic (HCC)     ROS:   All systems reviewed and negative except as noted in the HPI.   Past Surgical History:  Procedure Laterality Date  . 2D ECHOCARDIOGRAM  02/2011  . CARDIAC CATHETERIZATION       Family History  Problem Relation Age of Onset  . Cancer Mother 69  . Coronary artery disease Father        unknown  . Emphysema Father      Social History   Socioeconomic History  . Marital status: Married    Spouse name: Not on file  . Number of children: 1  . Years of education: Not on file  . Highest education level: Not on file  Occupational History  . Occupation: ELECTRICAL    Employer: [employer]  Tobacco Use  . Smoking status: Former Smoker    Packs/day: 1.00    Years: 15.00    Pack years: 15.00    Quit date: 10/23/2010    Years since quitting: 9.6  . Smokeless tobacco: Never Used  Substance and Sexual Activity  . Alcohol use: No  . Drug use: No  . Sexual activity: Not on file  Other Topics Concern  . Not on file  Social History Narrative    George Barber is married and lives with his wife.  They have     1 child together.  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  He quit alcohol 1 month ago, after a 20-year history     of what he describes as social alcohol, but does endorse drinking 6-8     beers per day.         Social Determinants of Health   Financial Resource Strain:   . Difficulty of Paying Living Expenses: Not on file  Food Insecurity:   . Worried About Programme researcher, broadcasting/film/video in the Last Year: Not on file  . Ran Out of Food in the Last Year: Not on file  Transportation Needs:   . Lack of Transportation (Medical): Not on file  . Lack of Transportation (Non-Medical): Not on file  Physical Activity:   . Days of Exercise per Week: Not on file  . Minutes of Exercise per Session: Not on file  Stress:   .  Feeling of Stress : Not on file  Social Connections:   . Frequency of Communication with Friends and Family: Not on file  . Frequency of Social Gatherings with Friends and Family: Not on file  . Attends Religious Services: Not on file  . Active Member of Clubs or Organizations: Not on file  . Attends Banker Meetings: Not on file  . Marital Status: Not on file  Intimate Partner Violence:   . Fear of Current or Ex-Partner: Not on file  . Emotionally Abused: Not on file  . Physically Abused: Not on file  . Sexually Abused: Not on file     BP (!) 164/88   Pulse 62   Ht 5\' 10"  (1.778 m)   Wt 139 lb 6.4 oz (63.2 kg)   SpO2 90%   BMI 20.00 kg/m   Physical Exam:  Well appearing NAD HEENT: Unremarkable Neck:  No JVD, no thyromegally Lymphatics:  No adenopathy Back:  No CVA tenderness Lungs:  Clear with no wheezes HEART:  Regular rate rhythm, no murmurs, no rubs, no clicks Abd:  soft, positive bowel sounds, no organomegally, no rebound, no guarding Ext:  2 plus pulses, no edema, no cyanosis, no clubbing Skin:  No rashes no nodules Neuro:  CN II through XII intact, motor grossly intact  EKG - nsr with LAE and poor R wave progression  DEVICE  Normal device function.  See PaceArt for details.   Assess/Plan: 1. VT - he has done well with no recurrent episodes. He has been instructed to reduce his dose of amiodarone to 200 mg daily, none on Sunday. I would recommend further reducing to 5 days a week in a year if his VT remains quiet. 2. Chronic systolic heart failure -his symptoms remain class 2. He will continue his current meds. He is encouraged to reduce his salt intake. 3. HTN - his SBP is up but he notes a lot of stress driving to our office today from [place]. 4. ICD - His St. Jude single chamber ICD is working normally. He has about 2 years of battery longevity.  Media Tanis Burnley,MD

## 2020-06-21 ENCOUNTER — Ambulatory Visit (INDEPENDENT_AMBULATORY_CARE_PROVIDER_SITE_OTHER): Payer: Commercial Managed Care - PPO

## 2020-06-21 DIAGNOSIS — Z95810 Presence of automatic (implantable) cardiac defibrillator: Secondary | ICD-10-CM | POA: Diagnosis not present

## 2020-06-21 DIAGNOSIS — I5022 Chronic systolic (congestive) heart failure: Secondary | ICD-10-CM | POA: Diagnosis not present

## 2020-06-23 NOTE — Progress Notes (Signed)
EPIC Encounter for ICM Monitoring  Patient Name: George Barber is a 59 y.o. male Date: 06/23/2020 Primary Care Physican: Bennie Pierini, FNP Primary Cardiologist:Taylor Electrophysiologist: Ladona Ridgel 10/27/2021Weight: 142lbs   Transmission reviewed.  CorVue thoracic impedancesuggestingnormalfluid levels.  Prescribed:Furosemide 20 mg 1 tablet daily  Labs: 02/04/2020 Creatinine 0.99, BUN 11, Potassium 4.3, Sodium 141, GFR 84-97 *A complete set of results can be found in Results Review.  Recommendations:No changes  Follow-up plan: ICM clinic phone appointment on1/10/2020.91 day device clinic remote transmission12/02/2020.   EP/Cardiology Office Visits:Recall 05/11/2020 with [name].  Message sent to EP scheduler to call patient for appointment.  Copy of ICM check sent to [name].    3 month ICM trend: 06/21/2020    1 Year ICM trend:       Karie Soda, RN 06/23/2020 5:03 PM

## 2020-06-30 ENCOUNTER — Ambulatory Visit (INDEPENDENT_AMBULATORY_CARE_PROVIDER_SITE_OTHER): Payer: Commercial Managed Care - PPO

## 2020-06-30 DIAGNOSIS — I429 Cardiomyopathy, unspecified: Secondary | ICD-10-CM | POA: Diagnosis not present

## 2020-07-01 LAB — CUP PACEART REMOTE DEVICE CHECK
Battery Remaining Longevity: 25 mo
Battery Remaining Percentage: 24 %
Battery Voltage: 2.8 V
Brady Statistic RV Percent Paced: 1 %
Date Time Interrogation Session: 20211208040015
HighPow Impedance: 71 Ohm
HighPow Impedance: 71 Ohm
Implantable Lead Implant Date: 20120813
Implantable Lead Location: 753860
Implantable Lead Model: 180
Implantable Lead Serial Number: 303783
Implantable Pulse Generator Implant Date: 20120813
Lead Channel Impedance Value: 290 Ohm
Lead Channel Pacing Threshold Amplitude: 1.5 V
Lead Channel Pacing Threshold Pulse Width: 0.8 ms
Lead Channel Sensing Intrinsic Amplitude: 11.8 mV
Lead Channel Setting Pacing Amplitude: 2.5 V
Lead Channel Setting Pacing Pulse Width: 0.8 ms
Lead Channel Setting Sensing Sensitivity: 0.5 mV
Pulse Gen Serial Number: 1005776

## 2020-07-13 ENCOUNTER — Other Ambulatory Visit: Payer: Self-pay | Admitting: Nurse Practitioner

## 2020-07-13 DIAGNOSIS — I5022 Chronic systolic (congestive) heart failure: Secondary | ICD-10-CM

## 2020-07-13 NOTE — Progress Notes (Signed)
Remote ICD transmission.   

## 2020-07-27 ENCOUNTER — Ambulatory Visit (INDEPENDENT_AMBULATORY_CARE_PROVIDER_SITE_OTHER): Payer: Commercial Managed Care - PPO

## 2020-07-27 DIAGNOSIS — Z95810 Presence of automatic (implantable) cardiac defibrillator: Secondary | ICD-10-CM

## 2020-07-27 DIAGNOSIS — I5022 Chronic systolic (congestive) heart failure: Secondary | ICD-10-CM | POA: Diagnosis not present

## 2020-07-30 NOTE — Progress Notes (Signed)
EPIC Encounter for ICM Monitoring  Patient Name: George Barber is a 60 y.o. male Date: 07/30/2020 Primary Care Physican: Bennie Pierini, FNP Primary Cardiologist:Taylor Electrophysiologist: Ladona Ridgel 10/27/2021Weight: 142lbs   Spoke with patient and reports severe diarrhea x 3 days which correlates with impedance above baseline.  He has back to feeling better last 2 days and diarrhea has resolved.    CorVue thoracic impedancesuggestingpossible dryness but returned to normal 07/28/2020.  Prescribed:Furosemide 20 mg 1 tablet daily  Labs: 02/04/2020 Creatinine 0.99, BUN 11, Potassium 4.3, Sodium 141, GFR 84-97 *A complete set of results can be found in Results Review.  Recommendations:No changes and encouraged to call if experiencing any fluid symptoms.  Follow-up plan: ICM clinic phone appointment on2/02/2021.91 day device clinic remote transmission 09/29/2020.   EP/Cardiology Office Visits:Recall 05/11/2020 with [name].    Copy of ICM check sent to [name]     3 month ICM trend: 07/27/2020.    1 Year ICM trend:       Karie Soda, RN 07/30/2020 12:41 PM

## 2020-08-31 ENCOUNTER — Ambulatory Visit (INDEPENDENT_AMBULATORY_CARE_PROVIDER_SITE_OTHER): Payer: Commercial Managed Care - PPO

## 2020-08-31 DIAGNOSIS — Z95810 Presence of automatic (implantable) cardiac defibrillator: Secondary | ICD-10-CM | POA: Diagnosis not present

## 2020-08-31 DIAGNOSIS — I5022 Chronic systolic (congestive) heart failure: Secondary | ICD-10-CM

## 2020-09-06 NOTE — Progress Notes (Signed)
EPIC Encounter for ICM Monitoring  Patient Name: HAKIM MINNIEFIELD is a 60 y.o. male Date: 09/06/2020 Primary Care Physican: Bennie Pierini, FNP Primary Cardiologist:Taylor Electrophysiologist: Ladona Ridgel 10/27/2021Weight: 142lbs   Transmission reviewed.     CorVue thoracic impedancesuggesting normal fluid levels.  Prescribed:Furosemide 20 mg 1 tablet daily  Labs: 02/04/2020 Creatinine0.99, BUN11, Potassium4.3, Sodium141, [id number] A complete set of results can be found in Results Review.  Recommendations:No changes.  Follow-up plan: ICM clinic phone appointment on3/15/2022.91 day device clinic remote transmission 09/29/2020.   EP/Cardiology Office Visits:  Recall 06/08/2021 with [name], PA.  Recall 05/26/2022 with [name].    Copy of ICM check sent to [name]   3 month ICM trend: 08/31/2020.    1 Year ICM trend:        Karie Soda, RN 09/06/2020 10:28 AM

## 2020-09-09 ENCOUNTER — Other Ambulatory Visit: Payer: Self-pay | Admitting: Internal Medicine

## 2020-09-29 ENCOUNTER — Ambulatory Visit (INDEPENDENT_AMBULATORY_CARE_PROVIDER_SITE_OTHER): Payer: Commercial Managed Care - PPO

## 2020-09-29 DIAGNOSIS — Z95810 Presence of automatic (implantable) cardiac defibrillator: Secondary | ICD-10-CM

## 2020-09-29 DIAGNOSIS — I429 Cardiomyopathy, unspecified: Secondary | ICD-10-CM | POA: Diagnosis not present

## 2020-09-29 DIAGNOSIS — I5022 Chronic systolic (congestive) heart failure: Secondary | ICD-10-CM

## 2020-09-29 NOTE — Progress Notes (Signed)
EPIC Encounter for ICM Monitoring  Patient Name: George Barber is a 60 y.o. male Date: 09/29/2020 Primary Care Physican: Bennie Pierini, FNP Primary Cardiologist:Taylor Electrophysiologist: Ladona Ridgel 3/9/2022Weight: 140lbs   Spoke with patient and he denies fluid symptoms at this time.  He reports he did have a respiratory infection over the last couple of weeks but that has resolved.  Discussed diet. He typically eats restaurant foods and advised to avoid if possible since are normally high in salt.   CorVue thoracic impedancesuggesting possible fluid accumulation starting 09/23/2020.  Prescribed:Furosemide 20 mg 1 tablet daily  Labs: 02/04/2020 Creatinine0.99, BUN11, Potassium4.3, Sodium141, [id number] A complete set of results can be found in Results Review.  Recommendations:Advised to take Furosemide 2 tablets daily x 3 days and then return to 1 tablet daily.  He verbalized understanding and agreed with plan.  Follow-up plan: ICM clinic phone appointment on3/15/2022 for 31 day check and recheck of fluids.91 day device clinic remote transmission6/02/2021.   EP/Cardiology Office Visits:  Recall 06/08/2021 with [name], PA.  Recall 05/26/2022 with [name].    Copy of ICM check sent to [name]   3 month ICM trend: 09/29/2020.    1 Year ICM trend:       Karie Soda, RN 09/29/2020 8:28 AM

## 2020-10-01 LAB — CUP PACEART REMOTE DEVICE CHECK
Battery Remaining Longevity: 20 mo
Battery Remaining Longevity: 20 mo
Battery Remaining Percentage: 21 %
Battery Remaining Percentage: 21 %
Battery Voltage: 2.78 V
Battery Voltage: 2.78 V
Brady Statistic RV Percent Paced: 1 %
Brady Statistic RV Percent Paced: 1 %
Date Time Interrogation Session: 20220309040029
Date Time Interrogation Session: 20220309040029
HighPow Impedance: 60 Ohm
HighPow Impedance: 60 Ohm
HighPow Impedance: 60 Ohm
HighPow Impedance: 60 Ohm
Implantable Lead Implant Date: 20120813
Implantable Lead Implant Date: 20120813
Implantable Lead Location: 753860
Implantable Lead Location: 753860
Implantable Lead Model: 180
Implantable Lead Model: 180
Implantable Lead Serial Number: 303783
Implantable Lead Serial Number: 303783
Implantable Pulse Generator Implant Date: 20120813
Implantable Pulse Generator Implant Date: 20120813
Lead Channel Impedance Value: 280 Ohm
Lead Channel Impedance Value: 280 Ohm
Lead Channel Pacing Threshold Amplitude: 1.5 V
Lead Channel Pacing Threshold Amplitude: 1.5 V
Lead Channel Pacing Threshold Pulse Width: 0.8 ms
Lead Channel Pacing Threshold Pulse Width: 0.8 ms
Lead Channel Sensing Intrinsic Amplitude: 9.7 mV
Lead Channel Sensing Intrinsic Amplitude: 9.7 mV
Lead Channel Setting Pacing Amplitude: 2.5 V
Lead Channel Setting Pacing Amplitude: 2.5 V
Lead Channel Setting Pacing Pulse Width: 0.8 ms
Lead Channel Setting Pacing Pulse Width: 0.8 ms
Lead Channel Setting Sensing Sensitivity: 0.5 mV
Lead Channel Setting Sensing Sensitivity: 0.5 mV
Pulse Gen Serial Number: 1005776
Pulse Gen Serial Number: 1005776

## 2020-10-05 ENCOUNTER — Ambulatory Visit (INDEPENDENT_AMBULATORY_CARE_PROVIDER_SITE_OTHER): Payer: Commercial Managed Care - PPO

## 2020-10-05 DIAGNOSIS — Z95810 Presence of automatic (implantable) cardiac defibrillator: Secondary | ICD-10-CM

## 2020-10-05 DIAGNOSIS — I5022 Chronic systolic (congestive) heart failure: Secondary | ICD-10-CM

## 2020-10-07 NOTE — Progress Notes (Signed)
Remote ICD transmission.   

## 2020-10-08 ENCOUNTER — Other Ambulatory Visit: Payer: Self-pay | Admitting: Nurse Practitioner

## 2020-10-08 DIAGNOSIS — J411 Mucopurulent chronic bronchitis: Secondary | ICD-10-CM

## 2020-10-08 NOTE — Progress Notes (Signed)
EPIC Encounter for ICM Monitoring  Patient Name: George Barber is a 60 y.o. male Date: 10/08/2020 Primary Care Physican: Bennie Pierini, FNP Primary Cardiologist:Taylor Electrophysiologist: Ladona Ridgel 3/9/2022Weight: 140lbs 10/08/2020 Weight: 142 lbs   Spoke with patient and he denies fluid symptoms at this time.  CorVue thoracic impedancesuggestingfluid levels returned to normal after taking extra Furosemide.  Impedance possible fluid accumulation starting 09/23/2020 - 10/02/2020.  Prescribed:Furosemide 20 mg 1 tablet daily  Labs: 02/04/2020 Creatinine0.99, BUN11, Potassium4.3, Sodium141, [id number] A complete set of results can be found in Results Review.  Recommendations:No changes and encouraged to call if experiencing any fluid symptoms.  Follow-up plan: ICM clinic phone appointment on4/19/2022.91 day device clinic remote transmission6/02/2021.   EP/Cardiology Office Visits:Recall 06/08/2021 with [name], PA.Recall11/3/2023with [name].    Copy of ICM check sent to [name].   3 month ICM trend: 10/05/2020.    1 Year ICM trend:       Karie Soda, RN 10/08/2020 12:11 PM

## 2020-10-11 ENCOUNTER — Other Ambulatory Visit: Payer: Self-pay | Admitting: Nurse Practitioner

## 2020-10-11 DIAGNOSIS — I5022 Chronic systolic (congestive) heart failure: Secondary | ICD-10-CM

## 2020-11-09 ENCOUNTER — Ambulatory Visit (INDEPENDENT_AMBULATORY_CARE_PROVIDER_SITE_OTHER): Payer: Commercial Managed Care - PPO

## 2020-11-09 DIAGNOSIS — I5022 Chronic systolic (congestive) heart failure: Secondary | ICD-10-CM | POA: Diagnosis not present

## 2020-11-09 DIAGNOSIS — Z95810 Presence of automatic (implantable) cardiac defibrillator: Secondary | ICD-10-CM

## 2020-11-12 ENCOUNTER — Telehealth: Payer: Self-pay

## 2020-11-12 NOTE — Progress Notes (Signed)
Spoke with patient and reports feeling well at this time.  Denies fluid symptoms.  Transmission reviewed.  No changes and encouraged to call if experiencing any fluid symptoms.   

## 2020-11-12 NOTE — Telephone Encounter (Signed)
Remote ICM transmission received.  Attempted call to patient regarding ICM remote transmission and no answer.  

## 2020-11-12 NOTE — Progress Notes (Signed)
EPIC Encounter for ICM Monitoring  Patient Name: George Barber is a 60 y.o. male Date: 11/12/2020 Primary Care Physican: Bennie Pierini, FNP Primary Cardiologist:Taylor Electrophysiologist: Ladona Ridgel 10/08/2020 Weight: 142 lbs   Attempted call to patient and unable to reach. Transmission reviewed.   CorVue thoracic impedance normal but was suggesting possible fluid accumulation from 10/13/2020 - 10/23/2020.  Prescribed:Furosemide 20 mg 1 tablet daily  Labs: 02/04/2020 Creatinine0.99, BUN11, Potassium4.3, Sodium141, [id number] A complete set of results can be found in Results Review.  Recommendations:Unable to reach.    Follow-up plan: ICM clinic phone appointment on6/03/2021.91 day device clinic remote transmission6/02/2021.   EP/Cardiology Office Visits:Recall 06/08/2021 with [name], PA.Recall11/3/2023with [name].    Copy of ICM check sent to [name].   3 month ICM trend: 11/09/2020.    1 Year ICM trend:       Karie Soda, RN 11/12/2020 12:26 PM

## 2020-11-13 ENCOUNTER — Other Ambulatory Visit: Payer: Self-pay | Admitting: Nurse Practitioner

## 2020-11-13 DIAGNOSIS — I5022 Chronic systolic (congestive) heart failure: Secondary | ICD-10-CM

## 2020-11-15 NOTE — Telephone Encounter (Signed)
MMM NTBS 30 days given 10/12/20

## 2020-11-19 ENCOUNTER — Other Ambulatory Visit: Payer: Self-pay | Admitting: Nurse Practitioner

## 2020-11-19 ENCOUNTER — Telehealth: Payer: Self-pay

## 2020-11-19 DIAGNOSIS — I5022 Chronic systolic (congestive) heart failure: Secondary | ICD-10-CM

## 2020-11-22 ENCOUNTER — Encounter: Payer: Self-pay | Admitting: Nurse Practitioner

## 2020-11-22 ENCOUNTER — Other Ambulatory Visit: Payer: Self-pay

## 2020-11-22 ENCOUNTER — Ambulatory Visit: Payer: Commercial Managed Care - PPO | Admitting: Nurse Practitioner

## 2020-11-22 VITALS — BP 140/78 | HR 61 | Temp 98.1°F | Resp 20 | Ht 70.0 in | Wt 145.0 lb

## 2020-11-22 DIAGNOSIS — J411 Mucopurulent chronic bronchitis: Secondary | ICD-10-CM | POA: Diagnosis not present

## 2020-11-22 DIAGNOSIS — R7989 Other specified abnormal findings of blood chemistry: Secondary | ICD-10-CM

## 2020-11-22 DIAGNOSIS — I472 Ventricular tachycardia: Secondary | ICD-10-CM

## 2020-11-22 DIAGNOSIS — I4729 Other ventricular tachycardia: Secondary | ICD-10-CM

## 2020-11-22 DIAGNOSIS — I429 Cardiomyopathy, unspecified: Secondary | ICD-10-CM

## 2020-11-22 DIAGNOSIS — I5022 Chronic systolic (congestive) heart failure: Secondary | ICD-10-CM | POA: Diagnosis not present

## 2020-11-22 DIAGNOSIS — I10 Essential (primary) hypertension: Secondary | ICD-10-CM | POA: Insufficient documentation

## 2020-11-22 MED ORDER — LOSARTAN POTASSIUM 100 MG PO TABS: 50.0000 mg | ORAL_TABLET | Freq: Every day | ORAL | 3 refills | Status: DC

## 2020-11-22 MED ORDER — ANORO ELLIPTA 62.5-25 MCG/INH IN AEPB: 1.0000 | INHALATION_SPRAY | Freq: Every day | RESPIRATORY_TRACT | 1 refills | Status: DC

## 2020-11-22 MED ORDER — FUROSEMIDE 20 MG PO TABS: ORAL_TABLET | ORAL | 1 refills | Status: DC

## 2020-11-22 MED ORDER — CARVEDILOL 12.5 MG PO TABS: 12.5000 mg | ORAL_TABLET | Freq: Two times a day (BID) | ORAL | 3 refills | Status: DC

## 2020-11-22 MED ORDER — AMIODARONE HCL 200 MG PO TABS: 200.0000 mg | ORAL_TABLET | Freq: Every day | ORAL | 0 refills | Status: DC

## 2020-11-22 MED ORDER — SPIRONOLACTONE 25 MG PO TABS: ORAL_TABLET | ORAL | 1 refills | Status: DC

## 2020-11-22 MED ORDER — ALBUTEROL SULFATE HFA 108 (90 BASE) MCG/ACT IN AERS: INHALATION_SPRAY | RESPIRATORY_TRACT | 0 refills | Status: DC

## 2020-11-22 NOTE — Progress Notes (Signed)
**Note George-Identified via Obfuscation** Subjective:    Patient ID: George Barber, male    DOB: 03-Feb-1961, 60 y.o.   MRN: [id number]   Chief Complaint: Medical Management of Chronic Issues    HPI:  1. Cardiomyopathy, secondary [facility]) Last saw cardiology on 1116/21. According to office note, no changes were made to plan of cre. BP Readings from Last 3 Encounters:  11/22/20 140/78  06/08/20 (!) 164/88  02/04/20 136/76     2. Chronic systolic heart failure (HCC) No c/o sob or swelling.  3. NSVT (nonsustained ventricular tachycardia) (HCC) No recent episodes of heart racing.  4. Mucopurulent chronic bronchitis (HCC) Is on anoro daily and is doing well. Has not needed his albuterol lately. Needs refill on albuterol.  5. Elevated LFTs Lab Results  Component Value Date   ALT 30 02/04/2020   AST 29 02/04/2020   ALKPHOS 61 02/04/2020   BILITOT <0.2 02/04/2020       Outpatient Encounter Medications as of 11/22/2020  Medication Sig  . albuterol (VENTOLIN HFA) 108 (90 Base) MCG/ACT inhaler INHALE 2 PUFFS BY MOUTH EVERY 6 HOURS AS NEEDED FOR WHEEZING AND FOR SHORTNESS OF BREATH .  [name] Kitchen amiodarone (PACERONE) 200 MG tablet Take 1 tablet (200 mg total) by mouth daily. Monday - Saturday  . ANORO ELLIPTA 62.5-25 MCG/INH AEPB Inhale 1 puff by mouth once daily  . aspirin 81 MG tablet Take 81 mg by mouth daily.    . carvedilol (COREG) 12.5 MG tablet Take 1 tablet (12.5 mg total) by mouth 2 (two) times daily with a meal. TAKE 1 TABLET BY MOUTH TWICE DAILY WITH MEALS . APPOINTMENT REQUIRED FOR FUTURE REFILLS  . furosemide (LASIX) 20 MG tablet TAKE 1 TABLET BY MOUTH ONCE DAILY . APPOINTMENT REQUIRED FOR FUTURE REFILLS  . losartan (COZAAR) 100 MG tablet Take 0.5 tablets (50 mg total) by mouth daily.  [name] Kitchen spironolactone (ALDACTONE) 25 MG tablet TAKE 1 TABLET BY MOUTH ONCE DAILY . APPOINTMENT REQUIRED FOR FUTURE REFILLS   No facility-administered encounter medications on file as of 11/22/2020.    Past Surgical History:  Procedure  Laterality Date  . 2D ECHOCARDIOGRAM  02/2011  . CARDIAC CATHETERIZATION      Family History  Problem Relation Age of Onset  . Cancer Mother 71  . Coronary artery disease Father        unknown  . Emphysema Father     New complaints: One today  Social history: Lives his wife.  Controlled substance contract: n/a    Review of Systems  Constitutional: Negative for diaphoresis.  Eyes: Negative for pain.  Respiratory: Negative for shortness of breath.   Cardiovascular: Negative for chest pain, palpitations and leg swelling.  Gastrointestinal: Negative for abdominal pain.  Endocrine: Negative for polydipsia.  Skin: Negative for rash.  Neurological: Negative for dizziness, weakness and headaches.  Hematological: Does not bruise/bleed easily.  All other systems reviewed and are negative.      Objective:   Physical Exam Vitals and nursing note reviewed.  Constitutional:      Appearance: Normal appearance. He is well-developed.  HENT:     Head: Normocephalic.     Nose: Nose normal.  Eyes:     Pupils: Pupils are equal, round, and reactive to light.  Neck:     Thyroid: No thyroid mass or thyromegaly.     Vascular: No carotid bruit or JVD.     Trachea: Phonation normal.  Cardiovascular:     Rate and Rhythm: Normal rate and regular rhythm.  Pulmonary:  Effort: Pulmonary effort is normal. No respiratory distress.     Breath sounds: Normal breath sounds.  Abdominal:     General: Bowel sounds are normal.     Palpations: Abdomen is soft.     Tenderness: There is no abdominal tenderness.  Musculoskeletal:        General: Normal range of motion.     Cervical back: Normal range of motion and neck supple.  Lymphadenopathy:     Cervical: No cervical adenopathy.  Skin:    General: Skin is warm and dry.  Neurological:     Mental Status: He is alert and oriented to person, place, and time.  Psychiatric:        Behavior: Behavior normal.        Thought Content: Thought  content normal.        Judgment: Judgment normal.     BP 140/78   Pulse 61   Temp 98.1 F (36.7 C) (Temporal)   Resp 20   Ht 5\' 10"  (1.778 m)   Wt 145 lb (65.8 kg)   SpO2 95%   BMI 20.81 kg/m        Assessment & Plan:  KOJO LIBY comes in today with chief complaint of Medical Management of Chronic Issues   Diagnosis and orders addressed:  1. Cardiomyopathy, secondary (HCC) Keep follow up with cardiology - amiodarone (PACERONE) 200 MG tablet; Take 1 tablet (200 mg total) by mouth daily. Monday - Saturday  Dispense: 90 tablet; Refill: 0 - carvedilol (COREG) 12.5 MG tablet; Take 1 tablet (12.5 mg total) by mouth 2 (two) times daily with a meal. TAKE 1 TABLET BY MOUTH TWICE DAILY WITH MEALS . APPOINTMENT REQUIRED FOR FUTURE REFILLS  Dispense: 180 tablet; Refill: 3  2. Chronic systolic heart failure (HCC) - spironolactone (ALDACTONE) 25 MG tablet; TAKE 1 TABLET BY MOUTH ONCE DAILY . APPOINTMENT REQUIRED FOR FUTURE REFILLS  Dispense: 90 tablet; Refill: 1 - furosemide (LASIX) 20 MG tablet; TAKE 1 TABLET BY MOUTH ONCE DAILY . APPOINTMENT REQUIRED FOR FUTURE REFILLS  Dispense: 90 tablet; Refill: 1  3. NSVT (nonsustained ventricular tachycardia) (HCC) Keep record of any tachycardia  4. Mucopurulent chronic bronchitis (HCC) - umeclidinium-vilanterol (ANORO ELLIPTA) 62.5-25 MCG/INH AEPB; Inhale 1 puff into the lungs daily.  Dispense: 180 each; Refill: 1 - albuterol (VENTOLIN HFA) 108 (90 Base) MCG/ACT inhaler; INHALE 2 PUFFS BY MOUTH EVERY 6 HOURS AS NEEDED FOR WHEEZING AND FOR SHORTNESS OF BREATH .  Dispense: 18 g; Refill: 0  5. Elevated LFTs Labs pending   6.  Primary hypertension Low sodium diet - losartan (COZAAR) 100 MG tablet; Take 0.5 tablets (50 mg total) by mouth daily.  Dispense: 45 tablet; Refill: 3   Labs pending Health Maintenance reviewed Diet and exercise encouraged  Follow up plan: 6 months   Mary-Margaret 08-22-1985, FNP

## 2020-11-22 NOTE — Addendum Note (Signed)
Addended by: Cleda Daub on: 11/22/2020 09:07 AM   Modules accepted: Orders

## 2020-11-22 NOTE — Patient Instructions (Signed)

## 2020-11-23 LAB — CMP14+EGFR
ALT: 22 IU/L (ref 0–44)
AST: 33 IU/L (ref 0–40)
Albumin/Globulin Ratio: 1.9 (ref 1.2–2.2)
Albumin: 4.2 g/dL (ref 3.8–4.9)
Alkaline Phosphatase: 78 IU/L (ref 44–121)
BUN/Creatinine Ratio: 14 (ref 9–20)
BUN: 13 mg/dL (ref 6–24)
Bilirubin Total: <0.2 mg/dL (ref 0.0–1.2)
CO2: 24 mmol/L (ref 20–29)
Calcium: 9.4 mg/dL (ref 8.7–10.2)
Chloride: 104 mmol/L (ref 96–106)
Creatinine, Ser: 0.91 mg/dL (ref 0.76–1.27)
Globulin, Total: 2.2 g/dL (ref 1.5–4.5)
Glucose: 79 mg/dL (ref 65–99)
Potassium: 4.6 mmol/L (ref 3.5–5.2)
Sodium: 143 mmol/L (ref 134–144)
Total Protein: 6.4 g/dL (ref 6.0–8.5)
eGFR: 97 mL/min/{1.73_m2} (ref >59)

## 2020-11-23 LAB — CBC WITH DIFFERENTIAL/PLATELET
Basophils Absolute: 0.1 10*3/uL (ref 0.0–0.2)
Basos: 1 %
EOS (ABSOLUTE): 0.4 10*3/uL (ref 0.0–0.4)
Eos: 6 %
Hematocrit: 40 % (ref 37.5–51.0)
Hemoglobin: 14.2 g/dL (ref 13.0–17.7)
Immature Grans (Abs): 0 10*3/uL (ref 0.0–0.1)
Immature Granulocytes: 0 %
Lymphocytes Absolute: 2.5 10*3/uL (ref 0.7–3.1)
Lymphs: 33 %
MCH: 35.1 pg — ABNORMAL HIGH (ref 26.6–33.0)
MCHC: 35.5 g/dL (ref 31.5–35.7)
MCV: 99 fL — ABNORMAL HIGH (ref 79–97)
Monocytes Absolute: 0.6 10*3/uL (ref 0.1–0.9)
Monocytes: 8 %
Neutrophils Absolute: 4 10*3/uL (ref 1.4–7.0)
Neutrophils: 52 %
Platelets: 322 10*3/uL (ref 150–450)
RBC: 4.04 x10E6/uL — ABNORMAL LOW (ref 4.14–5.80)
RDW: 11.8 % (ref 11.6–15.4)
WBC: 7.5 10*3/uL (ref 3.4–10.8)

## 2020-11-23 LAB — LIPID PANEL
Chol/HDL Ratio: 2.8 ratio (ref 0.0–5.0)
Cholesterol, Total: 150 mg/dL (ref 100–199)
HDL: 54 mg/dL (ref >39)
LDL Chol Calc (NIH): 82 mg/dL (ref 0–99)
Triglycerides: 71 mg/dL (ref 0–149)
VLDL Cholesterol Cal: 14 mg/dL (ref 5–40)

## 2020-12-29 ENCOUNTER — Ambulatory Visit (INDEPENDENT_AMBULATORY_CARE_PROVIDER_SITE_OTHER): Payer: Commercial Managed Care - PPO

## 2020-12-29 DIAGNOSIS — I429 Cardiomyopathy, unspecified: Secondary | ICD-10-CM

## 2020-12-30 ENCOUNTER — Ambulatory Visit (INDEPENDENT_AMBULATORY_CARE_PROVIDER_SITE_OTHER): Payer: Commercial Managed Care - PPO

## 2020-12-30 DIAGNOSIS — I5022 Chronic systolic (congestive) heart failure: Secondary | ICD-10-CM | POA: Diagnosis not present

## 2020-12-30 DIAGNOSIS — Z95810 Presence of automatic (implantable) cardiac defibrillator: Secondary | ICD-10-CM | POA: Diagnosis not present

## 2020-12-30 LAB — CUP PACEART REMOTE DEVICE CHECK
Battery Remaining Longevity: 17 mo
Battery Remaining Percentage: 17 %
Battery Voltage: 2.75 V
Brady Statistic RV Percent Paced: 1 %
Date Time Interrogation Session: 20220608214239
HighPow Impedance: 64 Ohm
HighPow Impedance: 64 Ohm
Implantable Lead Implant Date: 20120813
Implantable Lead Location: 753860
Implantable Lead Model: 180
Implantable Lead Serial Number: 303783
Implantable Pulse Generator Implant Date: 20120813
Lead Channel Impedance Value: 280 Ohm
Lead Channel Pacing Threshold Amplitude: 1.5 V
Lead Channel Pacing Threshold Pulse Width: 0.8 ms
Lead Channel Sensing Intrinsic Amplitude: 10.5 mV
Lead Channel Setting Pacing Amplitude: 2.5 V
Lead Channel Setting Pacing Pulse Width: 0.8 ms
Lead Channel Setting Sensing Sensitivity: 0.5 mV
Pulse Gen Serial Number: 1005776

## 2020-12-31 NOTE — Progress Notes (Signed)
EPIC Encounter for ICM Monitoring  Patient Name: George Barber is a 60 y.o. male Date: 12/31/2020 Primary Care Physican: Bennie Pierini, FNP Primary Cardiologist: Ladona Ridgel Electrophysiologist: Ladona Ridgel 12/31/2020 Weight: 142 lbs     Spoke with patient and reports feeling well at this time. Heart failure questions reviewed. Pt asymptomatic.        CorVue thoracic impedance normal.   Prescribed: Furosemide 20 mg 1 tablet daily   Labs: 11/22/2020 Creatinine 0.91, BUN 13, Potassium 4.6, Sodium 143, GFR 97 A complete set of results can be found in Results Review.   Recommendations:  No changes and encouraged to call if experiencing any fluid symptoms.   Follow-up plan: ICM clinic phone appointment on 02/07/2021.   91 day device clinic remote transmission 03/30/2021.     EP/Cardiology Office Visits:  Recall 06/08/2021 with [name], PA.   Recall 05/26/2022 with Dr. Ladona Ridgel.     Copy of ICM check sent to Dr. Ladona Ridgel.   3 month ICM trend: 12/30/2020.    1 Year ICM trend:       Karie Soda, RN 12/31/2020 2:11 PM

## 2021-01-11 ENCOUNTER — Telehealth: Payer: Self-pay | Admitting: Nurse Practitioner

## 2021-01-11 NOTE — Telephone Encounter (Signed)
Pt cannot refill losartan (COZAAR) 100 MG tablet. He said something about 01/28/2021. Pt asked if nurse can call and figure out why he cant get refill. Pt uses [facility]

## 2021-01-11 NOTE — Telephone Encounter (Signed)
Spoke with [employer].  They last filled Losartan in February and she is unsure what is going on  She is going to contact [employer] and then will call patient.

## 2021-01-20 NOTE — Progress Notes (Signed)
Remote ICD transmission.   

## 2021-02-07 ENCOUNTER — Ambulatory Visit (INDEPENDENT_AMBULATORY_CARE_PROVIDER_SITE_OTHER): Payer: Commercial Managed Care - PPO

## 2021-02-07 DIAGNOSIS — Z95810 Presence of automatic (implantable) cardiac defibrillator: Secondary | ICD-10-CM

## 2021-02-07 DIAGNOSIS — I5022 Chronic systolic (congestive) heart failure: Secondary | ICD-10-CM

## 2021-02-08 NOTE — Progress Notes (Signed)
EPIC Encounter for ICM Monitoring  Patient Name: NYREE YONKER is a 60 y.o. male Date: 02/08/2021 Primary Care Physican: Bennie Pierini, FNP Primary Cardiologist: Ladona Ridgel Electrophysiologist: Ladona Ridgel 12/31/2020 Weight: 142 lbs     Spoke with patient and reports feeling well at this time. Heart failure questions reviewed. Pt asymptomatic.  He has been drinking more fluids due to hot weather.         CorVue thoracic impedance normal but was suggesting possible fluid accumulation 7/10-7/15.   Prescribed: Furosemide 20 mg 1 tablet daily   Labs: 11/22/2020 Creatinine 0.91, BUN 13, Potassium 4.6, Sodium 143, GFR 97 A complete set of results can be found in Results Review.   Recommendations:  No changes and encouraged to call if experiencing any fluid symptoms.   Follow-up plan: ICM clinic phone appointment on 03/14/2021.   91 day device clinic remote transmission 03/30/2021.     EP/Cardiology Office Visits:  Recall 06/08/2021 with [name], PA.   Recall 05/26/2022 with Dr. Ladona Ridgel.     Copy of ICM check sent to Dr. Ladona Ridgel.    3 month ICM trend: 02/07/2021.    1 Year ICM trend:       Karie Soda, RN 02/08/2021 4:51 PM

## 2021-03-14 ENCOUNTER — Ambulatory Visit (INDEPENDENT_AMBULATORY_CARE_PROVIDER_SITE_OTHER): Payer: Commercial Managed Care - PPO

## 2021-03-14 DIAGNOSIS — I5022 Chronic systolic (congestive) heart failure: Secondary | ICD-10-CM

## 2021-03-14 DIAGNOSIS — Z95810 Presence of automatic (implantable) cardiac defibrillator: Secondary | ICD-10-CM | POA: Diagnosis not present

## 2021-03-15 ENCOUNTER — Telehealth: Payer: Self-pay

## 2021-03-15 NOTE — Progress Notes (Signed)
EPIC Encounter for ICM Monitoring  Patient Name: George Barber is a 60 y.o. male Date: 03/15/2021 Primary Care Physican: George Pierini, FNP Primary Cardiologist: George Barber Electrophysiologist: George Barber 12/31/2020 Weight: 142 lbs     Attempted call to patient and unable to reach.  Left detailed message per DPR regarding transmission. Transmission reviewed.          CorVue thoracic impedance suggesting possible fluid accumulation starting 8/19 but returning to baseline on transmission date 8/22.    Prescribed: Furosemide 20 mg 1 tablet daily   Labs: 11/22/2020 Creatinine 0.91, BUN 13, Potassium 4.6, Sodium 143, GFR 97 A complete set of results can be found in Results Review.   Recommendations:  Left voice mail with ICM number and encouraged to call if experiencing any fluid symptoms.   Follow-up plan: ICM clinic phone appointment on 03/14/2021.   91 day device clinic remote transmission 03/30/2021.     EP/Cardiology Office Visits:  Recall 06/08/2021 with George Saber, PA.   Recall 05/26/2022 with Dr. Ladona Barber.     Copy of ICM check sent to Dr. Ladona Barber.    3 month ICM trend: 03/14/2021.    1 Year ICM trend:       Karie Soda, RN 03/15/2021 4:50 PM

## 2021-03-15 NOTE — Telephone Encounter (Signed)
Remote ICM transmission received.  Attempted call to patient regarding ICM remote transmission and left detailed message per DPR.  Advised to return call for any fluid symptoms or questions. Next ICM remote transmission scheduled 04/25/2021.    

## 2021-03-30 ENCOUNTER — Ambulatory Visit (INDEPENDENT_AMBULATORY_CARE_PROVIDER_SITE_OTHER): Payer: Commercial Managed Care - PPO

## 2021-03-30 DIAGNOSIS — I429 Cardiomyopathy, unspecified: Secondary | ICD-10-CM

## 2021-03-30 LAB — CUP PACEART REMOTE DEVICE CHECK
Battery Remaining Longevity: 13 mo
Battery Remaining Percentage: 13 %
Battery Voltage: 2.72 V
Brady Statistic RV Percent Paced: 1 %
Date Time Interrogation Session: 20220907040018
HighPow Impedance: 65 Ohm
HighPow Impedance: 65 Ohm
Implantable Lead Implant Date: 20120813
Implantable Lead Location: 753860
Implantable Lead Model: 180
Implantable Lead Serial Number: 303783
Implantable Pulse Generator Implant Date: 20120813
Lead Channel Impedance Value: 310 Ohm
Lead Channel Pacing Threshold Amplitude: 1.5 V
Lead Channel Pacing Threshold Pulse Width: 0.8 ms
Lead Channel Sensing Intrinsic Amplitude: 11.8 mV
Lead Channel Setting Pacing Amplitude: 2.5 V
Lead Channel Setting Pacing Pulse Width: 0.8 ms
Lead Channel Setting Sensing Sensitivity: 0.5 mV
Pulse Gen Serial Number: 1005776

## 2021-04-07 NOTE — Progress Notes (Signed)
Remote ICD transmission.   

## 2021-04-23 ENCOUNTER — Other Ambulatory Visit: Payer: Self-pay | Admitting: Nurse Practitioner

## 2021-04-23 DIAGNOSIS — I429 Cardiomyopathy, unspecified: Secondary | ICD-10-CM

## 2021-04-25 ENCOUNTER — Ambulatory Visit (INDEPENDENT_AMBULATORY_CARE_PROVIDER_SITE_OTHER): Payer: Commercial Managed Care - PPO

## 2021-04-25 DIAGNOSIS — Z95810 Presence of automatic (implantable) cardiac defibrillator: Secondary | ICD-10-CM | POA: Diagnosis not present

## 2021-04-25 DIAGNOSIS — I5022 Chronic systolic (congestive) heart failure: Secondary | ICD-10-CM

## 2021-04-26 ENCOUNTER — Telehealth: Payer: Self-pay

## 2021-04-26 NOTE — Telephone Encounter (Signed)
Remote ICM transmission received.  Attempted call to patient regarding ICM remote transmission and left message to return call   

## 2021-04-26 NOTE — Progress Notes (Signed)
EPIC Encounter for ICM Monitoring  Patient Name: George Barber is a 60 y.o. male Date: 04/26/2021 Primary Care Physican: Bennie Pierini, FNP Primary Cardiologist: Ladona Ridgel Electrophysiologist: Ladona Ridgel 12/31/2020 Weight: 142 lbs   Attempted call to patient and unable to reach.  Left detailed message per DPR regarding transmission. Transmission reviewed.          CorVue thoracic impedance suggesting normal fluid levels.    Prescribed: Furosemide 20 mg 1 tablet daily   Labs: 11/22/2020 Creatinine 0.91, BUN 13, Potassium 4.6, Sodium 143, GFR 97 A complete set of results can be found in Results Review.   Recommendations:  Left voice mail with ICM number and encouraged to call if experiencing any fluid symptoms.   Follow-up plan: ICM clinic phone appointment on 05/30/2021.   91 day device clinic remote transmission 06/29/2021.     EP/Cardiology Office Visits:  Recall 06/08/2021 with [name], PA.   Recall 05/26/2022 with Dr. Ladona Ridgel.     Copy of ICM check sent to Dr. Ladona Ridgel.     3 month ICM trend: 04/25/2021.    1 Year ICM trend:       Karie Soda, RN 04/26/2021 3:45 PM

## 2021-05-25 ENCOUNTER — Ambulatory Visit: Payer: Commercial Managed Care - PPO | Admitting: Nurse Practitioner

## 2021-05-25 ENCOUNTER — Other Ambulatory Visit: Payer: Self-pay

## 2021-05-25 ENCOUNTER — Encounter: Payer: Self-pay | Admitting: Nurse Practitioner

## 2021-05-25 ENCOUNTER — Telehealth: Payer: Self-pay | Admitting: *Deleted

## 2021-05-25 VITALS — BP 140/78 | HR 57 | Temp 97.5°F | Resp 20 | Ht 70.0 in | Wt 144.0 lb

## 2021-05-25 DIAGNOSIS — I4729 Other ventricular tachycardia: Secondary | ICD-10-CM

## 2021-05-25 DIAGNOSIS — I429 Cardiomyopathy, unspecified: Secondary | ICD-10-CM

## 2021-05-25 DIAGNOSIS — I5022 Chronic systolic (congestive) heart failure: Secondary | ICD-10-CM | POA: Diagnosis not present

## 2021-05-25 DIAGNOSIS — I10 Essential (primary) hypertension: Secondary | ICD-10-CM

## 2021-05-25 DIAGNOSIS — Z125 Encounter for screening for malignant neoplasm of prostate: Secondary | ICD-10-CM

## 2021-05-25 DIAGNOSIS — J411 Mucopurulent chronic bronchitis: Secondary | ICD-10-CM

## 2021-05-25 DIAGNOSIS — Z95810 Presence of automatic (implantable) cardiac defibrillator: Secondary | ICD-10-CM

## 2021-05-25 MED ORDER — CARVEDILOL 12.5 MG PO TABS: 12.5000 mg | ORAL_TABLET | Freq: Two times a day (BID) | ORAL | 1 refills | Status: DC

## 2021-05-25 MED ORDER — SPIRONOLACTONE 25 MG PO TABS: ORAL_TABLET | ORAL | 1 refills | Status: DC

## 2021-05-25 MED ORDER — AMIODARONE HCL 200 MG PO TABS: 200.0000 mg | ORAL_TABLET | Freq: Every day | ORAL | 1 refills | Status: DC

## 2021-05-25 MED ORDER — LOSARTAN POTASSIUM 100 MG PO TABS: 50.0000 mg | ORAL_TABLET | Freq: Every day | ORAL | 1 refills | Status: DC

## 2021-05-25 MED ORDER — FUROSEMIDE 20 MG PO TABS: ORAL_TABLET | ORAL | 1 refills | Status: DC

## 2021-05-25 NOTE — Patient Instructions (Signed)

## 2021-05-25 NOTE — Progress Notes (Signed)
**Note George-Identified via Obfuscation** Subjective:    Patient ID: George Barber, male    DOB: 25-Nov-1960, 60 y.o.   MRN: [id number]   Chief Complaint: medical management of chronic issues     HPI:  1. Primary hypertension No c/o chest pain, sob or headache. Des not check blood pressure at home. BP Readings from Last 3 Encounters:  11/22/20 140/78  06/08/20 (!) 164/88  02/04/20 136/76     2. Chronic systolic heart failure (HCC) Last had in office visit with cardiology on 05/11/21. Review of office note no change in plan of care.  3. Cardiomyopathy, secondary (HCC) Denies any dyspnea on exertion.  4. NSVT (nonsustained ventricular tachycardia) Last had pacemaker checked on 04/25/21.  5. Mucopurulent chronic bronchitis (HCC) Has not seen pulmonology. Uses anoro daily and has uses albuterol a coupleof times a week at work due to the checmicals he is around.  6. Automatic implantable cardioverter-defibrillator in situ Again, had pace maker check 04/25/21.    Outpatient Encounter Medications as of 05/25/2021  Medication Sig   albuterol (VENTOLIN HFA) 108 (90 Base) MCG/ACT inhaler INHALE 2 PUFFS BY MOUTH EVERY 6 HOURS AS NEEDED FOR WHEEZING AND FOR SHORTNESS OF BREATH .   amiodarone (PACERONE) 200 MG tablet Take 1 tablet (200 mg total) by mouth daily. Monday through Saturday   aspirin 81 MG tablet Take 81 mg by mouth daily.     carvedilol (COREG) 12.5 MG tablet Take 1 tablet (12.5 mg total) by mouth 2 (two) times daily with a meal. TAKE 1 TABLET BY MOUTH TWICE DAILY WITH MEALS . APPOINTMENT REQUIRED FOR FUTURE REFILLS   furosemide (LASIX) 20 MG tablet TAKE 1 TABLET BY MOUTH ONCE DAILY . APPOINTMENT REQUIRED FOR FUTURE REFILLS   losartan (COZAAR) 100 MG tablet Take 0.5 tablets (50 mg total) by mouth daily.   spironolactone (ALDACTONE) 25 MG tablet TAKE 1 TABLET BY MOUTH ONCE DAILY . APPOINTMENT REQUIRED FOR FUTURE REFILLS   umeclidinium-vilanterol (ANORO ELLIPTA) 62.5-25 MCG/INH AEPB Inhale 1 puff into the lungs  daily.   No facility-administered encounter medications on file as of 05/25/2021.    Past Surgical History:  Procedure Laterality Date   2D ECHOCARDIOGRAM  02/2011   CARDIAC CATHETERIZATION      Family History  Problem Relation Age of Onset   Cancer Mother 33   Coronary artery disease Father        unknown   Emphysema Father     New complaints: None today  Social history: Lives with wife  Controlled substance contract: n/a      Review of Systems  Constitutional:  Negative for diaphoresis.  Eyes:  Negative for pain.  Respiratory:  Negative for shortness of breath.   Cardiovascular:  Negative for chest pain, palpitations and leg swelling.  Gastrointestinal:  Negative for abdominal pain.  Endocrine: Negative for polydipsia.  Skin:  Negative for rash.  Neurological:  Negative for dizziness, weakness and headaches.  Hematological:  Does not bruise/bleed easily.  All other systems reviewed and are negative.     Objective:   Physical Exam Vitals and nursing note reviewed.  Constitutional:      Appearance: Normal appearance. He is well-developed.  HENT:     Head: Normocephalic.     Nose: Nose normal.  Eyes:     Pupils: Pupils are equal, round, and reactive to light.  Neck:     Thyroid: No thyroid mass or thyromegaly.     Vascular: No carotid bruit or JVD.     Trachea: Phonation  normal.  Cardiovascular:     Rate and Rhythm: Normal rate and regular rhythm.  Pulmonary:     Effort: Pulmonary effort is normal. No respiratory distress.     Breath sounds: Normal breath sounds.  Abdominal:     General: Bowel sounds are normal.     Palpations: Abdomen is soft.     Tenderness: There is no abdominal tenderness.  Musculoskeletal:        General: Normal range of motion.     Cervical back: Normal range of motion and neck supple.  Lymphadenopathy:     Cervical: No cervical adenopathy.  Skin:    General: Skin is warm and dry.  Neurological:     Mental Status: He is  alert and oriented to person, place, and time.  Psychiatric:        Behavior: Behavior normal.        Thought Content: Thought content normal.        Judgment: Judgment normal.    BP 140/78   Pulse (!) 57   Temp (!) 97.5 F (36.4 C) (Temporal)   Resp 20   Ht 5\' 10"  (1.778 m)   Wt 144 lb (65.3 kg)   SpO2 100%   BMI 20.66 kg/m         Assessment & Plan:   George Barber comes in today with chief complaint of Medical Management of Chronic Issues   Diagnosis and orders addressed:  1. Primary hypertension Low sodium diet - losartan (COZAAR) 100 MG tablet; Take 0.5 tablets (50 mg total) by mouth daily.  Dispense: 45 tablet; Refill: 1  2. Chronic systolic heart failure (HCC) Keep follow up with crdiology - spironolactone (ALDACTONE) 25 MG tablet; TAKE 1 TABLET BY MOUTH ONCE DAILY . APPOINTMENT REQUIRED FOR FUTURE REFILLS  Dispense: 90 tablet; Refill: 1 - furosemide (LASIX) 20 MG tablet; TAKE 1 TABLET BY MOUTH ONCE DAILY . APPOINTMENT REQUIRED FOR FUTURE REFILLS  Dispense: 90 tablet; Refill: 1  3. Cardiomyopathy, secondary (HCC) - amiodarone (PACERONE) 200 MG tablet; Take 1 tablet (200 mg total) by mouth daily. Monday through Saturday  Dispense: 90 tablet; Refill: 1 - carvedilol (COREG) 12.5 MG tablet; Take 1 tablet (12.5 mg total) by mouth 2 (two) times daily with a meal. TAKE 1 TABLET BY MOUTH TWICE DAILY WITH MEALS . APPOINTMENT REQUIRED FOR FUTURE REFILLS  Dispense: 180 tablet; Refill: 1  4. NSVT (nonsustained ventricular tachycardia) Avoid caffeine  5. Mucopurulent chronic bronchitis (HCC) Continue anoro daily. Albuterol as needed  6. Automatic implantable cardioverter-defibrillator in situ Keep check of pacemaker monthly   Labs pending Health Maintenance reviewed Diet and exercise encouraged  Follow up plan: 6 month   Mary-Margaret Sunday, FNP

## 2021-05-25 NOTE — Telephone Encounter (Signed)
Called pt to clarify that he wants to use [facility], it is a mail order pharmacy with their company but they do not have eprescribing we would have to fax the script to [phone] Would like the Anoro Ellipta for 90 d supply with 3 refills sent to them

## 2021-05-26 ENCOUNTER — Other Ambulatory Visit: Payer: Self-pay | Admitting: Nurse Practitioner

## 2021-05-26 LAB — CMP14+EGFR
ALT: 21 IU/L (ref 0–44)
AST: 25 IU/L (ref 0–40)
Albumin/Globulin Ratio: 2.3 — ABNORMAL HIGH (ref 1.2–2.2)
Albumin: 4.5 g/dL (ref 3.8–4.9)
Alkaline Phosphatase: 69 IU/L (ref 44–121)
BUN/Creatinine Ratio: 9 (ref 9–20)
BUN: 9 mg/dL (ref 6–24)
Bilirubin Total: 0.3 mg/dL (ref 0.0–1.2)
CO2: 26 mmol/L (ref 20–29)
Calcium: 9.7 mg/dL (ref 8.7–10.2)
Chloride: 102 mmol/L (ref 96–106)
Creatinine, Ser: 0.98 mg/dL (ref 0.76–1.27)
Globulin, Total: 2 g/dL (ref 1.5–4.5)
Glucose: 66 mg/dL — ABNORMAL LOW (ref 70–99)
Potassium: 4.9 mmol/L (ref 3.5–5.2)
Sodium: 139 mmol/L (ref 134–144)
Total Protein: 6.5 g/dL (ref 6.0–8.5)
eGFR: 89 mL/min/{1.73_m2} (ref >59)

## 2021-05-26 LAB — LIPID PANEL
Chol/HDL Ratio: 2.3 ratio (ref 0.0–5.0)
Cholesterol, Total: 162 mg/dL (ref 100–199)
HDL: 71 mg/dL (ref >39)
LDL Chol Calc (NIH): 79 mg/dL (ref 0–99)
Triglycerides: 59 mg/dL (ref 0–149)
VLDL Cholesterol Cal: 12 mg/dL (ref 5–40)

## 2021-05-26 LAB — CBC WITH DIFFERENTIAL/PLATELET
Basophils Absolute: 0.1 10*3/uL (ref 0.0–0.2)
Basos: 1 %
EOS (ABSOLUTE): 0.3 10*3/uL (ref 0.0–0.4)
Eos: 4 %
Hematocrit: 46.1 % (ref 37.5–51.0)
Hemoglobin: 16 g/dL (ref 13.0–17.7)
Immature Grans (Abs): 0 10*3/uL (ref 0.0–0.1)
Immature Granulocytes: 0 %
Lymphocytes Absolute: 2.5 10*3/uL (ref 0.7–3.1)
Lymphs: 31 %
MCH: 35 pg — ABNORMAL HIGH (ref 26.6–33.0)
MCHC: 34.7 g/dL (ref 31.5–35.7)
MCV: 101 fL — ABNORMAL HIGH (ref 79–97)
Monocytes Absolute: 0.7 10*3/uL (ref 0.1–0.9)
Monocytes: 8 %
Neutrophils Absolute: 4.5 10*3/uL (ref 1.4–7.0)
Neutrophils: 56 %
Platelets: 299 10*3/uL (ref 150–450)
RBC: 4.57 x10E6/uL (ref 4.14–5.80)
RDW: 12.9 % (ref 11.6–15.4)
WBC: 8.1 10*3/uL (ref 3.4–10.8)

## 2021-05-26 LAB — PSA, TOTAL AND FREE
PSA, Free Pct: 17.1 %
PSA, Free: 0.24 ng/mL
Prostate Specific Ag, Serum: 1.4 ng/mL (ref 0.0–4.0)

## 2021-05-26 MED ORDER — ANORO ELLIPTA 62.5-25 MCG/ACT IN AEPB: 1.0000 | INHALATION_SPRAY | Freq: Every day | RESPIRATORY_TRACT | 2 refills | Status: DC

## 2021-05-26 NOTE — Progress Notes (Signed)
Anoro printed and signed

## 2021-05-30 ENCOUNTER — Ambulatory Visit (INDEPENDENT_AMBULATORY_CARE_PROVIDER_SITE_OTHER): Payer: Commercial Managed Care - PPO

## 2021-05-30 DIAGNOSIS — I5022 Chronic systolic (congestive) heart failure: Secondary | ICD-10-CM

## 2021-05-30 DIAGNOSIS — Z95810 Presence of automatic (implantable) cardiac defibrillator: Secondary | ICD-10-CM | POA: Diagnosis not present

## 2021-06-01 NOTE — Progress Notes (Signed)
EPIC Encounter for ICM Monitoring  Patient Name: George Barber is a 60 y.o. male Date: 06/01/2021 Primary Care Physican: Bennie Pierini, FNP Primary Cardiologist: Ladona Ridgel Electrophysiologist: Ladona Ridgel 05/25/2021 Office Weight: 144 lbs   Transmission reviewed.          CorVue thoracic impedance suggesting normal fluid levels.    Prescribed:  Furosemide 20 mg 1 tablet daily Spironolactone 25 mg take 1 tablet daily   Labs: 05/25/2021 Creatinine 0.98, BUN 9,   Potassium 4.9, Sodium 139, GFR 89 11/22/2020 Creatinine 0.91, BUN 13, Potassium 4.6, Sodium 143, GFR 97 A complete set of results can be found in Results Review.   Recommendations:  No changes.   Follow-up plan: ICM clinic phone appointment on 07/04/2021.   91 day device clinic remote transmission 06/29/2021.     EP/Cardiology Office Visits:  Recall 06/08/2021 with [name], PA.   Recall 05/26/2022 with Dr. Ladona Ridgel.     Copy of ICM check sent to Dr. Ladona Ridgel.    3 month ICM trend: 05/30/2021.    1 Year ICM trend:       Karie Soda, RN 06/01/2021 12:38 PM

## 2021-06-29 ENCOUNTER — Ambulatory Visit (INDEPENDENT_AMBULATORY_CARE_PROVIDER_SITE_OTHER): Payer: Commercial Managed Care - PPO

## 2021-06-29 DIAGNOSIS — I429 Cardiomyopathy, unspecified: Secondary | ICD-10-CM | POA: Diagnosis not present

## 2021-06-30 LAB — CUP PACEART REMOTE DEVICE CHECK
Battery Remaining Longevity: 10 mo
Battery Remaining Percentage: 10 %
Battery Voltage: 2.68 V
Brady Statistic RV Percent Paced: 1 %
Date Time Interrogation Session: 20221207194915
HighPow Impedance: 64 Ohm
HighPow Impedance: 64 Ohm
Implantable Lead Implant Date: 20120813
Implantable Lead Location: 753860
Implantable Lead Model: 180
Implantable Lead Serial Number: 303783
Implantable Pulse Generator Implant Date: 20120813
Lead Channel Impedance Value: 280 Ohm
Lead Channel Pacing Threshold Amplitude: 1.5 V
Lead Channel Pacing Threshold Pulse Width: 0.8 ms
Lead Channel Sensing Intrinsic Amplitude: 11.8 mV
Lead Channel Setting Pacing Amplitude: 2.5 V
Lead Channel Setting Pacing Pulse Width: 0.8 ms
Lead Channel Setting Sensing Sensitivity: 0.5 mV
Pulse Gen Serial Number: 1005776

## 2021-07-04 ENCOUNTER — Ambulatory Visit (INDEPENDENT_AMBULATORY_CARE_PROVIDER_SITE_OTHER): Payer: Commercial Managed Care - PPO

## 2021-07-04 DIAGNOSIS — I5022 Chronic systolic (congestive) heart failure: Secondary | ICD-10-CM | POA: Diagnosis not present

## 2021-07-04 DIAGNOSIS — Z95810 Presence of automatic (implantable) cardiac defibrillator: Secondary | ICD-10-CM | POA: Diagnosis not present

## 2021-07-06 ENCOUNTER — Telehealth: Payer: Self-pay

## 2021-07-06 NOTE — Telephone Encounter (Signed)
Remote ICM transmission received.  Attempted call to patient regarding ICM remote transmission and left message per DPR to return call.   

## 2021-07-06 NOTE — Progress Notes (Signed)
Spoke with patient and heart failure questions reviewed.  Pt asymptomatic for fluid accumulation and feeling well.  No changes and encouraged to call if experiencing any fluid symptoms.   

## 2021-07-06 NOTE — Progress Notes (Signed)
EPIC Encounter for ICM Monitoring  Patient Name: George Barber is a 60 y.o. male Date: 07/06/2021 Primary Care Physican: Bennie Pierini, FNP Primary Cardiologist: Ladona Ridgel Electrophysiologist: Ladona Ridgel 05/25/2021 Office Weight: 144 lbs   Attempted call to patient and unable to reach.  Left message to return call. Transmission reviewed.          CorVue thoracic impedance suggesting normal fluid levels.    Prescribed:  Furosemide 20 mg 1 tablet daily Spironolactone 25 mg take 1 tablet daily   Labs: 05/25/2021 Creatinine 0.98, BUN 9,   Potassium 4.9, Sodium 139, GFR 89 11/22/2020 Creatinine 0.91, BUN 13, Potassium 4.6, Sodium 143, GFR 97 A complete set of results can be found in Results Review.   Recommendations:  Unable to reach.     Follow-up plan: ICM clinic phone appointment on 08/08/2021.  91 day device clinic remote transmission 09/28/2021.     EP/Cardiology Office Visits:  Recall 06/08/2021 with [name], PA.   Recall 05/26/2022 with Dr. Ladona Ridgel.     Copy of ICM check sent to Dr. Ladona Ridgel.    3 month ICM trend: 07/04/2021.    12-14 Month ICM trend:       Karie Soda, RN 07/06/2021 1:48 PM

## 2021-07-07 NOTE — Progress Notes (Signed)
Remote ICD transmission.   

## 2021-08-08 ENCOUNTER — Telehealth: Payer: Self-pay

## 2021-08-08 ENCOUNTER — Ambulatory Visit (INDEPENDENT_AMBULATORY_CARE_PROVIDER_SITE_OTHER): Payer: Commercial Managed Care - PPO

## 2021-08-08 DIAGNOSIS — Z95810 Presence of automatic (implantable) cardiac defibrillator: Secondary | ICD-10-CM

## 2021-08-08 DIAGNOSIS — I5022 Chronic systolic (congestive) heart failure: Secondary | ICD-10-CM

## 2021-08-08 NOTE — Telephone Encounter (Signed)
Remote ICM transmission received.  Attempted call to patient regarding ICM remote transmission and left message to return call   

## 2021-08-08 NOTE — Progress Notes (Signed)
EPIC Encounter for ICM Monitoring  Patient Name: George Barber is a 61 y.o. male Date: 08/08/2021 Primary Care Physican: Chevis Pretty, [place] Primary Cardiologist: Lovena Le Electrophysiologist: Lovena Le 05/25/2021 Office Weight: 144 lbs   Attempted call to patient and unable to reach.  Left message to return call. Transmission reviewed.          CorVue thoracic impedance suggesting possible dryness starting 08/07/2021.   Impedance was suggesting possible fluid accumulation from 12/29-07/31/2021   Prescribed:  Furosemide 20 mg 1 tablet daily Spironolactone 25 mg take 1 tablet daily   Labs: 05/25/2021 Creatinine 0.98, BUN 9,   Potassium 4.9, Sodium 139, GFR 89 11/22/2020 Creatinine 0.91, BUN 13, Potassium 4.6, Sodium 143, GFR 97 A complete set of results can be found in Results Review.   Recommendations:  Unable to reach.     Follow-up plan: ICM clinic phone appointment on 08/16/2021 to recheck fluid levels.  91 day device clinic remote transmission 09/28/2021.     EP/Cardiology Office Visits:  Recall 06/08/2021 with Oda Kilts, PA.   Recall 05/26/2022 with Dr. Lovena Le.     Copy of ICM check sent to Dr. Lovena Le.    3 month ICM trend: 08/08/2021.    12-14 Month ICM trend:     Rosalene Billings, RN 08/08/2021 2:52 PM

## 2021-08-16 ENCOUNTER — Ambulatory Visit (INDEPENDENT_AMBULATORY_CARE_PROVIDER_SITE_OTHER): Payer: Commercial Managed Care - PPO

## 2021-08-16 DIAGNOSIS — I5022 Chronic systolic (congestive) heart failure: Secondary | ICD-10-CM

## 2021-08-16 DIAGNOSIS — Z95810 Presence of automatic (implantable) cardiac defibrillator: Secondary | ICD-10-CM

## 2021-08-17 NOTE — Progress Notes (Signed)
EPIC Encounter for ICM Monitoring  Patient Name: George Barber is a 62 y.o. male Date: 08/17/2021 Primary Care Physican: Bennie Pierini, FNP Primary Cardiologist: Ladona Ridgel Electrophysiologist: Ladona Ridgel 05/25/2021 Office Weight: 144 lbs   Transmission reviewed.         CorVue thoracic impedance suggesting fluid levels returned close to baseline.   Prescribed:  Furosemide 20 mg 1 tablet daily Spironolactone 25 mg take 1 tablet daily   Labs: 05/25/2021 Creatinine 0.98, BUN 9,   Potassium 4.9, Sodium 139, GFR 89 11/22/2020 Creatinine 0.91, BUN 13, Potassium 4.6, Sodium 143, GFR 97 A complete set of results can be found in Results Review.   Recommendations:  No changes.   Follow-up plan: ICM clinic phone appointment on 09/12/2021.  91 day device clinic remote transmission 09/28/2021.     EP/Cardiology Office Visits:  Recall 06/08/2021 with [name], PA.   Recall 05/26/2022 with Dr. Ladona Ridgel.     Copy of ICM check sent to Dr. Ladona Ridgel.   3 month ICM trend: 08/16/2021.    12-14 Month ICM trend:     Karie Soda, RN 08/17/2021 9:42 AM

## 2021-09-12 ENCOUNTER — Ambulatory Visit (INDEPENDENT_AMBULATORY_CARE_PROVIDER_SITE_OTHER): Payer: Commercial Managed Care - PPO

## 2021-09-12 DIAGNOSIS — Z95810 Presence of automatic (implantable) cardiac defibrillator: Secondary | ICD-10-CM | POA: Diagnosis not present

## 2021-09-12 DIAGNOSIS — I5022 Chronic systolic (congestive) heart failure: Secondary | ICD-10-CM

## 2021-09-14 NOTE — Progress Notes (Signed)
EPIC Encounter for ICM Monitoring  Patient Name: George Barber is a 61 y.o. male Date: 09/14/2021 Primary Care Physican: Bennie Pierini, FNP Primary Cardiologist: Ladona Ridgel Electrophysiologist: Ladona Ridgel 09/14/2021 Weight: 144 lbs   Spoke with patient and heart failure questions reviewed.  Pt asymptomatic for fluid accumulation.  Reports feeling well at this time and voices no complaints.          CorVue thoracic impedance suggesting fluid levels returned close to baseline.   Prescribed:  Furosemide 20 mg 1 tablet daily Spironolactone 25 mg take 1 tablet daily   Labs: 05/25/2021 Creatinine 0.98, BUN 9,   Potassium 4.9, Sodium 139, GFR 89 11/22/2020 Creatinine 0.91, BUN 13, Potassium 4.6, Sodium 143, GFR 97 A complete set of results can be found in Results Review.   Recommendations:  No changes and encouraged to call if experiencing any fluid symptoms.   Follow-up plan: ICM clinic phone appointment on 10/17/2021.  91 day device clinic remote transmission 09/28/2021.     EP/Cardiology Office Visits:  Advised overdue for EP appointment and agreed to have EP scheduler call to make appointment.  Recall 06/08/2021 with [name], PA.   Recall 05/26/2022 with Dr. Ladona Ridgel.     Copy of ICM check sent to Dr. Ladona Ridgel.   3 month ICM trend: 09/12/2021.    12-14 Month ICM trend:     Karie Soda, RN 09/14/2021 11:56 AM

## 2021-09-28 ENCOUNTER — Ambulatory Visit (INDEPENDENT_AMBULATORY_CARE_PROVIDER_SITE_OTHER): Payer: Commercial Managed Care - PPO

## 2021-09-28 DIAGNOSIS — I429 Cardiomyopathy, unspecified: Secondary | ICD-10-CM | POA: Diagnosis not present

## 2021-09-28 LAB — CUP PACEART REMOTE DEVICE CHECK
Battery Remaining Longevity: 8 mo
Battery Remaining Percentage: 8 %
Battery Voltage: 2.66 V
Brady Statistic RV Percent Paced: 1 %
Date Time Interrogation Session: 20230308040018
HighPow Impedance: 63 Ohm
HighPow Impedance: 63 Ohm
Implantable Lead Implant Date: 20120813
Implantable Lead Location: 753860
Implantable Lead Model: 180
Implantable Lead Serial Number: 303783
Implantable Pulse Generator Implant Date: 20120813
Lead Channel Impedance Value: 290 Ohm
Lead Channel Pacing Threshold Amplitude: 1.5 V
Lead Channel Pacing Threshold Pulse Width: 0.8 ms
Lead Channel Sensing Intrinsic Amplitude: 11.8 mV
Lead Channel Setting Pacing Amplitude: 2.5 V
Lead Channel Setting Pacing Pulse Width: 0.8 ms
Lead Channel Setting Sensing Sensitivity: 0.5 mV
Pulse Gen Serial Number: 1005776

## 2021-10-11 NOTE — Progress Notes (Signed)
Remote ICD transmission.   

## 2021-10-17 ENCOUNTER — Encounter: Payer: Commercial Managed Care - PPO | Admitting: Student

## 2021-10-19 ENCOUNTER — Ambulatory Visit (INDEPENDENT_AMBULATORY_CARE_PROVIDER_SITE_OTHER): Payer: Commercial Managed Care - PPO

## 2021-10-19 DIAGNOSIS — Z95810 Presence of automatic (implantable) cardiac defibrillator: Secondary | ICD-10-CM

## 2021-10-19 DIAGNOSIS — I5022 Chronic systolic (congestive) heart failure: Secondary | ICD-10-CM

## 2021-10-19 NOTE — Progress Notes (Signed)
EPIC Encounter for ICM Monitoring ? ?Patient Name: George Barber is a 61 y.o. male ?Date: 10/19/2021 ?Primary Care Physican: Bennie Pierini, FNP ?Primary Cardiologist: Ladona Ridgel ?Electrophysiologist: Ladona Ridgel ?09/14/2021 Weight: 144 lbs ? ?Battery ERI:  6.6 months and advised pt. ?  ?Spoke with patient and heart failure questions reviewed.  Pt asymptomatic for fluid accumulation.  Reports feeling well at this time and voices no complaints.   Advised to update DPR at 4/10 Office visit. ?  ?      CorVue thoracic impedance suggesting normal fluid levels. ?  ?Prescribed:  ?Furosemide 20 mg 1 tablet daily ?Spironolactone 25 mg take 1 tablet daily ?  ?Labs: ?05/25/2021 Creatinine 0.98, BUN 9,   Potassium 4.9, Sodium 139, GFR 89 ?11/22/2020 Creatinine 0.91, BUN 13, Potassium 4.6, Sodium 143, GFR 97 ?A complete set of results can be found in Results Review. ?  ?Recommendations:  No changes and encouraged to call if experiencing any fluid symptoms. ?  ?Follow-up plan: ICM clinic phone appointment on 11/21/2021.  91 day device clinic remote transmission 12/28/2021. ? ?EP/Cardiology Office Visits:  10/31/2021 with [name], PA.   Recall 05/26/2022 with Dr. Ladona Ridgel.   ?  ?Copy of ICM check sent to Dr. Ladona Ridgel.   ? ?3 month ICM trend: 10/17/2021. ? ? ? ?12-14 Month ICM trend:  ? ? ? ?Karie Soda, RN ?10/19/2021 ?10:42 AM ? ?

## 2021-10-21 ENCOUNTER — Telehealth: Payer: Self-pay | Admitting: *Deleted

## 2021-10-21 NOTE — Telephone Encounter (Signed)
Fax rcvd from [facility], pt would like to have Anoro Ellipta filled at this pharmacy, per their request. ? ?LMOVM to verify with pt that this has been requested by them ?

## 2021-10-26 NOTE — Progress Notes (Signed)
? ? ?Electrophysiology Office Note ?Date: 10/31/2021 ? ?ID:  [name], DOB [birth year], MRN [id number] ? ?PCP: Bennie Pierini, FNP ?Primary Cardiologist: None ?Electrophysiologist: Lewayne Bunting, MD  ? ?CC: Routine ICD follow-up ? ?George Barber is a 61 y.o. male seen today for Lewayne Bunting, MD for routine electrophysiology followup.  Since last being seen in our clinic the patient reports doing very well.  he denies chest pain, palpitations, dyspnea, PND, orthopnea, nausea, vomiting, dizziness, syncope, edema, weight gain, or early satiety. He has not had ICD shocks.  ? ?Device History: ?St. Jude Single Chamber ICD implanted 03/06/2011 for VT ?History of appropriate therapy: No ?History of AAD therapy: Yes (Amiodarone) ? ?Past Medical History:  ?Diagnosis Date  ? Elevated LFTs   ? Emphysema   ? Fluttering heart   ? NICM (nonischemic cardiomyopathy) (HCC)   ? cath 12/13/10: Normal cors, EF 10-15%;  b. echo 5/12 EF 15%, mild MR, mod LAE, mild RVE, mild to mod RAE, mild to mod TR, PASP 44  ? NSVT (nonsustained ventricular tachycardia) (HCC)   ? Life Vest; amiodarone rx  ? Systolic CHF, chronic (HCC)   ? ?Past Surgical History:  ?Procedure Laterality Date  ? 2D ECHOCARDIOGRAM  02/2011  ? CARDIAC CATHETERIZATION    ? ? ?Current Outpatient Medications  ?Medication Sig Dispense Refill  ? albuterol (VENTOLIN HFA) 108 (90 Base) MCG/ACT inhaler INHALE 2 PUFFS BY MOUTH EVERY 6 HOURS AS NEEDED FOR WHEEZING AND FOR SHORTNESS OF BREATH . 18 g 0  ? amiodarone (PACERONE) 200 MG tablet Take 1 tablet (200 mg total) by mouth daily. Monday through Saturday 90 tablet 1  ? aspirin 81 MG tablet Take 81 mg by mouth daily.      ? carvedilol (COREG) 12.5 MG tablet Take 1 tablet (12.5 mg total) by mouth 2 (two) times daily with a meal. TAKE 1 TABLET BY MOUTH TWICE DAILY WITH MEALS . APPOINTMENT REQUIRED FOR FUTURE REFILLS 180 tablet 1  ? furosemide (LASIX) 20 MG tablet TAKE 1 TABLET BY MOUTH ONCE DAILY . APPOINTMENT REQUIRED FOR  FUTURE REFILLS 90 tablet 1  ? losartan (COZAAR) 100 MG tablet Take 0.5 tablets (50 mg total) by mouth daily. 45 tablet 1  ? spironolactone (ALDACTONE) 25 MG tablet TAKE 1 TABLET BY MOUTH ONCE DAILY . APPOINTMENT REQUIRED FOR FUTURE REFILLS 90 tablet 1  ? umeclidinium-vilanterol (ANORO ELLIPTA) 62.5-25 MCG/ACT AEPB Inhale 1 puff into the lungs daily. 60 each 2  ? ?No current facility-administered medications for this visit.  ? ? ?Allergies:   Patient has no known allergies.  ? ?Social History: ?Social History  ? ?Socioeconomic History  ? Marital status: Married  ?  Spouse name: Not on file  ? Number of children: 1  ? Years of education: Not on file  ? Highest education level: Not on file  ?Occupational History  ? Occupation: ELECTRICAL  ?  Employer: [employer]  ?Tobacco Use  ? Smoking status: Former  ?  Packs/day: 1.00  ?  Years: 15.00  ?  Pack years: 15.00  ?  Types: Cigarettes  ?  Quit date: 10/23/2010  ?  Years since quitting: 11.0  ? Smokeless tobacco: Never  ?Substance and Sexual Activity  ? Alcohol use: No  ? Drug use: No  ? Sexual activity: Not on file  ?Other Topics Concern  ? Not on file  ?Social History Narrative  ?  Mr. Artola is married and lives with his wife.  They have   ?  1 child together.  He is an Personnel officer.  He smoked for 20 years and   ?  quit 1 month ago.  He quit alcohol 1 month ago, after a 20-year history   ?  of what he describes as social alcohol, but does endorse drinking 6-8   ?  beers per day.   ?     ? ?Social Determinants of Health  ? ?Financial Resource Strain: Not on file  ?Food Insecurity: Not on file  ?Transportation Needs: Not on file  ?Physical Activity: Not on file  ?Stress: Not on file  ?Social Connections: Not on file  ?Intimate Partner Violence: Not on file  ? ? ?Family History: ?Family History  ?Problem Relation Age of Onset  ? Cancer Mother 29  ? Coronary artery disease Father   ?     unknown  ? Emphysema Father   ? ? ?Review of Systems: ?All other systems reviewed and  are otherwise negative except as noted above. ? ? ?Physical Exam: ?Vitals:  ? 10/31/21 0937  ?BP: (!) 148/82  ?SpO2: 100%  ?Weight: 141 lb 12.8 oz (64.3 kg)  ?Height: 5\' 10"  (1.778 m)  ?  ? ?GEN- The patient is well appearing, alert and oriented x 3 today.   ?HEENT: normocephalic, atraumatic; sclera clear, conjunctiva pink; hearing intact; oropharynx clear; neck supple, no JVP ?Lymph- no cervical lymphadenopathy ?Lungs- Clear to ausculation bilaterally, normal work of breathing.  No wheezes, rales, rhonchi ?Heart- Regular rate and rhythm, no murmurs, rubs or gallops, PMI not laterally displaced ?GI- soft, non-tender, non-distended, bowel sounds present, no hepatosplenomegaly ?Extremities- no clubbing or cyanosis. No edema; DP/PT/radial pulses 2+ bilaterally ?MS- no significant deformity or atrophy ?Skin- warm and dry, no rash or lesion; ICD pocket well healed ?Psych- euthymic mood, full affect ?Neuro- strength and sensation are intact ? ?ICD interrogation- reviewed in detail today,  See PACEART report ? ?EKG:  EKG is ordered today. ?Personal review of EKG ordered today shows sinus bradycardia at 56 bpm.  ? ?Recent Labs: ?05/25/2021: ALT 21; BUN 9; Creatinine, Ser 0.98; Hemoglobin 16.0; Platelets 299; Potassium 4.9; Sodium 139  ? ?Wt Readings from Last 3 Encounters:  ?10/31/21 141 lb 12.8 oz (64.3 kg)  ?05/25/21 144 lb (65.3 kg)  ?11/22/20 145 lb (65.8 kg)  ?  ? ?Other studies Reviewed: ?Additional studies/ records that were reviewed today include: Previous EP office notes.  ? ?Assessment and Plan: ? ?1.  Chronic systolic dysfunction s/p St. Jude single chamber ICD  ?euvolemic today ?Stable on an appropriate medical regimen ?Normal ICD function ?See 01/22/21 Art report ?No changes today ? ?2. VT ?Quiescent. Continue amiodarone ? ?3. HTN ?Stable on current regimen  ? ?Current medicines are reviewed at length with the patient today.   ? ?Labs/ tests ordered today include:  ?Orders Placed This Encounter  ?Procedures  ?  Comprehensive metabolic panel  ? TSH  ? T4, free  ? EKG 12-Lead  ? ?Disposition:   Follow up with Dr. Arita Miss in  6-8 months to discuss gen change   ? ? ?Signed, ?Ladona Ridgel, PA-C  ?10/31/2021 ?10:15 AM ? ?[facility] ?[address] ?[address] ?[place] ?[phone] (office) ?[phone] (fax)  ?

## 2021-10-31 ENCOUNTER — Ambulatory Visit: Payer: Commercial Managed Care - PPO | Admitting: Student

## 2021-10-31 ENCOUNTER — Encounter: Payer: Self-pay | Admitting: Student

## 2021-10-31 VITALS — BP 148/82 | Ht 70.0 in | Wt 141.8 lb

## 2021-10-31 DIAGNOSIS — I5022 Chronic systolic (congestive) heart failure: Secondary | ICD-10-CM

## 2021-10-31 DIAGNOSIS — I429 Cardiomyopathy, unspecified: Secondary | ICD-10-CM | POA: Diagnosis not present

## 2021-10-31 DIAGNOSIS — I4729 Other ventricular tachycardia: Secondary | ICD-10-CM | POA: Diagnosis not present

## 2021-10-31 LAB — COMPREHENSIVE METABOLIC PANEL
ALT: 22 IU/L (ref 0–44)
AST: 26 IU/L (ref 0–40)
Albumin/Globulin Ratio: 2.1 (ref 1.2–2.2)
Albumin: 4.5 g/dL (ref 3.8–4.9)
Alkaline Phosphatase: 60 IU/L (ref 44–121)
BUN/Creatinine Ratio: 16 (ref 10–24)
BUN: 14 mg/dL (ref 8–27)
Bilirubin Total: 0.5 mg/dL (ref 0.0–1.2)
CO2: 23 mmol/L (ref 20–29)
Calcium: 9.6 mg/dL (ref 8.6–10.2)
Chloride: 101 mmol/L (ref 96–106)
Creatinine, Ser: 0.87 mg/dL (ref 0.76–1.27)
Globulin, Total: 2.1 g/dL (ref 1.5–4.5)
Glucose: 84 mg/dL (ref 70–99)
Potassium: 4.7 mmol/L (ref 3.5–5.2)
Sodium: 138 mmol/L (ref 134–144)
Total Protein: 6.6 g/dL (ref 6.0–8.5)
eGFR: 99 mL/min/{1.73_m2} (ref >59)

## 2021-10-31 LAB — CUP PACEART INCLINIC DEVICE CHECK
Battery Remaining Longevity: 7 mo
Brady Statistic RV Percent Paced: 0.12 %
Date Time Interrogation Session: 20230410101514
HighPow Impedance: 76.5 Ohm
Implantable Lead Implant Date: 20120813
Implantable Lead Location: 753860
Implantable Lead Model: 180
Implantable Lead Serial Number: 303783
Implantable Pulse Generator Implant Date: 20120813
Lead Channel Impedance Value: 312.5 Ohm
Lead Channel Pacing Threshold Amplitude: 1.25 V
Lead Channel Pacing Threshold Amplitude: 1.25 V
Lead Channel Pacing Threshold Pulse Width: 0.8 ms
Lead Channel Pacing Threshold Pulse Width: 0.8 ms
Lead Channel Sensing Intrinsic Amplitude: 11.8 mV
Lead Channel Setting Pacing Amplitude: 2.5 V
Lead Channel Setting Pacing Pulse Width: 0.8 ms
Lead Channel Setting Sensing Sensitivity: 0.5 mV
Pulse Gen Serial Number: 1005776

## 2021-10-31 LAB — T4, FREE: Free T4: 1.8 ng/dL — ABNORMAL HIGH (ref 0.82–1.77)

## 2021-10-31 LAB — TSH: TSH: 2.15 u[IU]/mL (ref 0.450–4.500)

## 2021-10-31 NOTE — Patient Instructions (Signed)
Medication Instructions:  ?Your physician recommends that you continue on your current medications as directed. Please refer to the Current Medication list given to you today. ? ?*If you need a refill on your cardiac medications before your next appointment, please call your pharmacy* ? ? ?Lab Work: ?TODAY: CMET, TSH, Free T4 ? ?If you have labs (blood work) drawn today and your tests are completely normal, you will receive your results only by: ?MyChart Message (if you have MyChart) OR ?A paper copy in the mail ?If you have any lab test that is abnormal or we need to change your treatment, we will call you to review the results. ? ? ?Follow-Up: ?At [facility], you and your health needs are our priority.  As part of our continuing mission to provide you with exceptional heart care, we have created designated Provider Care Teams.  These Care Teams include your primary Cardiologist (physician) and Advanced Practice Providers (APPs -  Physician Assistants and Nurse Practitioners) who all work together to provide you with the care you need, when you need it. ? ?We recommend signing up for the patient portal called "MyChart".  Sign up information is provided on this After Visit Summary.  MyChart is used to connect with patients for Virtual Visits (Telemedicine).  Patients are able to view lab/test results, encounter notes, upcoming appointments, etc.  Non-urgent messages can be sent to your provider as well.   ?To learn more about what you can do with MyChart, go to [email or web address].   ? ?Your next appointment:   ?7 month(s) ? ?The format for your next appointment:   ?In Person ? ?Provider:   ?Lewayne Bunting, MD  ? ?Important Information About Sugar ? ? ? ? ?  ?

## 2021-11-02 NOTE — Telephone Encounter (Addendum)
LMOVM to call back & verify that pt is requesting for script for Anoro Ellipta to be sent to [facility]. Their information is [address] [place], [place] [address] ph# [phone] F [phone]. This is a pharmacy in [place], which we will not be able to Escribe to. ?

## 2021-11-10 ENCOUNTER — Other Ambulatory Visit: Payer: Self-pay | Admitting: Nurse Practitioner

## 2021-11-10 NOTE — Telephone Encounter (Signed)
?  Prescription Request ? ?11/10/2021 ? ?Is this a "Controlled Substance" medicine? NO ? ?Have you seen your PCP in the last 2 weeks? NO ? ?If YES, route message to pool  -  If NO, patient needs to be scheduled for appointment. ? ?What is the name of the medication or equipment? Anoro  ? ?Have you contacted your pharmacy to request a refill? Yes  ? ?Which pharmacy would you like this sent to? [facility] F# [phone] ? ? ?Patient notified that their request is being sent to the clinical staff for review and that they should receive a response within 2 business days.  ?  ?

## 2021-11-10 NOTE — Telephone Encounter (Signed)
Pt request refills to [facility] which is in [place] ?If Appropriate, please print so that it can be faxed to [phone] ?

## 2021-11-14 ENCOUNTER — Other Ambulatory Visit: Payer: Self-pay

## 2021-11-14 MED ORDER — ANORO ELLIPTA 62.5-25 MCG/ACT IN AEPB: 1.0000 | INHALATION_SPRAY | Freq: Every day | RESPIRATORY_TRACT | 2 refills | Status: DC

## 2021-11-21 ENCOUNTER — Ambulatory Visit (INDEPENDENT_AMBULATORY_CARE_PROVIDER_SITE_OTHER): Payer: Commercial Managed Care - PPO

## 2021-11-21 DIAGNOSIS — I5022 Chronic systolic (congestive) heart failure: Secondary | ICD-10-CM | POA: Diagnosis not present

## 2021-11-21 DIAGNOSIS — Z95810 Presence of automatic (implantable) cardiac defibrillator: Secondary | ICD-10-CM | POA: Diagnosis not present

## 2021-11-22 ENCOUNTER — Encounter: Payer: Self-pay | Admitting: Nurse Practitioner

## 2021-11-22 ENCOUNTER — Ambulatory Visit (INDEPENDENT_AMBULATORY_CARE_PROVIDER_SITE_OTHER): Payer: Commercial Managed Care - PPO | Admitting: Nurse Practitioner

## 2021-11-22 VITALS — BP 142/85 | HR 57 | Temp 98.0°F | Resp 20 | Ht 70.0 in | Wt 146.0 lb

## 2021-11-22 DIAGNOSIS — Z95810 Presence of automatic (implantable) cardiac defibrillator: Secondary | ICD-10-CM

## 2021-11-22 DIAGNOSIS — I5022 Chronic systolic (congestive) heart failure: Secondary | ICD-10-CM | POA: Diagnosis not present

## 2021-11-22 DIAGNOSIS — I10 Essential (primary) hypertension: Secondary | ICD-10-CM | POA: Diagnosis not present

## 2021-11-22 DIAGNOSIS — R7989 Other specified abnormal findings of blood chemistry: Secondary | ICD-10-CM

## 2021-11-22 DIAGNOSIS — I429 Cardiomyopathy, unspecified: Secondary | ICD-10-CM

## 2021-11-22 DIAGNOSIS — J411 Mucopurulent chronic bronchitis: Secondary | ICD-10-CM

## 2021-11-22 MED ORDER — ANORO ELLIPTA 62.5-25 MCG/ACT IN AEPB: 1.0000 | INHALATION_SPRAY | Freq: Every day | RESPIRATORY_TRACT | 5 refills | Status: DC

## 2021-11-22 MED ORDER — FUROSEMIDE 20 MG PO TABS: ORAL_TABLET | ORAL | 1 refills | Status: DC

## 2021-11-22 MED ORDER — LOSARTAN POTASSIUM 100 MG PO TABS: 50.0000 mg | ORAL_TABLET | Freq: Every day | ORAL | 1 refills | Status: DC

## 2021-11-22 MED ORDER — CARVEDILOL 12.5 MG PO TABS: 12.5000 mg | ORAL_TABLET | Freq: Two times a day (BID) | ORAL | 1 refills | Status: DC

## 2021-11-22 MED ORDER — SPIRONOLACTONE 25 MG PO TABS: ORAL_TABLET | ORAL | 1 refills | Status: DC

## 2021-11-22 MED ORDER — AMIODARONE HCL 200 MG PO TABS: 200.0000 mg | ORAL_TABLET | Freq: Every day | ORAL | 1 refills | Status: DC

## 2021-11-22 NOTE — Progress Notes (Addendum)
? ?Subjective:  ? ? Patient ID: George Barber, male    DOB: Mar 09, 1961, 61 y.o.   MRN: [id number] ? ? ?Chief Complaint: medical management of chronic issues  ?  ? ?HPI: ? ?George Barber is a 61 y.o. who identifies as a male who was assigned male at birth.  ? ?Social history: ?Lives with: wife ?Work history: [employer] ? ? ?Comes in today for follow up of the following chronic medical issues: ? ?1. Primary hypertension ?No c/o chest pain, sob or headache. Doe snot check blood pressure at home. ?BP Readings from Last 3 Encounters:  ?10/31/21 (!) 148/82  ?05/25/21 140/78  ?11/22/20 140/78  ? ? ?2. Cardiomyopathy, secondary ([place]) ?3. Chronic systolic heart failure ([place]) ?4. Automatic implantable cardioverter-defibrillator in situ ?Last saw cardiology 10/31/21. Review of office note showed no change in plan of care. He has only had defibrillator kick any 1 time since he has had it. He is scheduled for a battery change in a few months. ? ?5. Mucopurulent chronic bronchitis ([place]) ?Patient is on anoro daly and only uses ventolin a couple of times a month. He doesn ot smoke. ? ?6. Elevated LFTsi ?Only drinks alcohol a couple of tmes a week. ?Lab Results  ?Component Value Date  ? ALT 22 10/31/2021  ? AST 26 10/31/2021  ? ALKPHOS 60 10/31/2021  ? BILITOT 0.5 10/31/2021  ? ? ? ? ?New complaints: ?None today ? ?No Known Allergies ?Outpatient Encounter Medications as of 11/22/2021  ?Medication Sig  ? albuterol (VENTOLIN HFA) 108 (90 Base) MCG/ACT inhaler INHALE 2 PUFFS BY MOUTH EVERY 6 HOURS AS NEEDED FOR WHEEZING AND FOR SHORTNESS OF BREATH .  ? amiodarone (PACERONE) 200 MG tablet Take 1 tablet (200 mg total) by mouth daily. Monday through Saturday  ? aspirin 81 MG tablet Take 81 mg by mouth daily.    ? carvedilol (COREG) 12.5 MG tablet Take 1 tablet (12.5 mg total) by mouth 2 (two) times daily with a meal. TAKE 1 TABLET BY MOUTH TWICE DAILY WITH MEALS . APPOINTMENT REQUIRED FOR FUTURE REFILLS  ? furosemide (LASIX) 20 MG  tablet TAKE 1 TABLET BY MOUTH ONCE DAILY . APPOINTMENT REQUIRED FOR FUTURE REFILLS  ? losartan (COZAAR) 100 MG tablet Take 0.5 tablets (50 mg total) by mouth daily.  ? spironolactone (ALDACTONE) 25 MG tablet TAKE 1 TABLET BY MOUTH ONCE DAILY . APPOINTMENT REQUIRED FOR FUTURE REFILLS  ? umeclidinium-vilanterol (ANORO ELLIPTA) 62.5-25 MCG/ACT AEPB Inhale 1 puff into the lungs daily.  ? ?No facility-administered encounter medications on file as of 11/22/2021.  ? ? ?Past Surgical History:  ?Procedure Laterality Date  ? 2D ECHOCARDIOGRAM  02/2011  ? CARDIAC CATHETERIZATION    ? ? ?Family History  ?Problem Relation Age of Onset  ? Cancer Mother 39  ? Coronary artery disease Father   ?     unknown  ? Emphysema Father   ? ? ? ? ?Controlled substance contract: n/a ? ? ? ? ?Review of Systems  ?Constitutional:  Negative for diaphoresis.  ?Eyes:  Negative for pain.  ?Respiratory:  Negative for shortness of breath.   ?Cardiovascular:  Negative for chest pain, palpitations and leg swelling.  ?Gastrointestinal:  Negative for abdominal pain.  ?Endocrine: Negative for polydipsia.  ?Skin:  Negative for rash.  ?Neurological:  Negative for dizziness, weakness and headaches.  ?Hematological:  Does not bruise/bleed easily.  ?All other systems reviewed and are negative. ? ?   ?Objective:  ? Physical Exam ?Vitals and nursing note  reviewed.  ?Constitutional:   ?   Appearance: George appearance. He is well-developed.  ?HENT:  ?   Head: Normocephalic.  ?   Nose: Nose George.  ?   Mouth/Throat:  ?   Mouth: Mucous membranes are moist.  ?   Pharynx: Oropharynx is clear.  ?Eyes:  ?   Pupils: Pupils are equal, round, and reactive to light.  ?Neck:  ?   Thyroid: No thyroid mass or thyromegaly.  ?   Vascular: No carotid bruit or JVD.  ?   Trachea: Phonation George.  ?Cardiovascular:  ?   Rate and Rhythm: George rate and regular rhythm.  ?Pulmonary:  ?   Effort: Pulmonary effort is George. No respiratory distress.  ?   Breath sounds: George breath  sounds.  ?Abdominal:  ?   General: Bowel sounds are George.  ?   Palpations: Abdomen is soft.  ?   Tenderness: There is no abdominal tenderness.  ?Musculoskeletal:     ?   General: George range of motion.  ?   Cervical back: George range of motion and neck supple.  ?Lymphadenopathy:  ?   Cervical: No cervical adenopathy.  ?Skin: ?   General: Skin is warm and dry.  ?Neurological:  ?   Mental Status: He is alert and oriented to person, place, and time.  ?Psychiatric:     ?   Behavior: Behavior George.     ?   Thought Content: Thought content George.     ?   Judgment: Judgment George.  ? ? ?BP (!) 142/85   Pulse (!) 57   Temp 98 ?F (36.7 ?C) (Temporal)   Resp 20   Ht 5' 10"  (1.778 m)   Wt 146 lb (66.2 kg)   SpO2 97%   BMI 20.95 kg/m?  ? ? ? ?   ?Assessment & Plan:  ? ?George Barber comes in today with chief complaint of Medical Management of Chronic Issues ? ? ?Diagnosis and orders addressed: ? ?1. Primary hypertension ?Low sodium diet ?- losartan (COZAAR) 100 MG tablet; Take 0.5 tablets (50 mg total) by mouth daily.  Dispense: 45 tablet; Refill: 1 ? ?2. Cardiomyopathy, secondary ([place]) ?Keep follow up with cardiology ?- amiodarone (PACERONE) 200 MG tablet; Take 1 tablet (200 mg total) by mouth daily. Monday through Saturday  Dispense: 90 tablet; Refill: 1 ?- carvedilol (COREG) 12.5 MG tablet; Take 1 tablet (12.5 mg total) by mouth 2 (two) times daily with a meal. TAKE 1 TABLET BY MOUTH TWICE DAILY WITH MEALS . APPOINTMENT REQUIRED FOR FUTURE REFILLS  Dispense: 180 tablet; Refill: 1 ? ?3. Chronic systolic heart failure (HCC) ?- spironolactone (ALDACTONE) 25 MG tablet; TAKE 1 TABLET BY MOUTH ONCE DAILY . APPOINTMENT REQUIRED FOR FUTURE REFILLS  Dispense: 90 tablet; Refill: 1 ?- furosemide (LASIX) 20 MG tablet; TAKE 1 TABLET BY MOUTH ONCE DAILY . APPOINTMENT REQUIRED FOR FUTURE REFILLS  Dispense: 90 tablet; Refill: 1 ? ?4. Automatic implantable cardioverter-defibrillator in situ ?Keep appointment to have battery  changed ? ?5. Mucopurulent chronic bronchitis ([place]) ?Avoid cigarette smoke ?- umeclidinium-vilanterol (ANORO ELLIPTA) 62.5-25 MCG/ACT AEPB; Inhale 1 puff into the lungs daily.  Dispense: 60 each; Refill: 5 ? ?6. Elevated LFTs ?Labs pending ? ?Orders Placed This Encounter  ?Procedures  ? CBC with Differential/Platelet  ? CMP14+EGFR  ? Lipid panel  ? ? ?Labs pending ?Health Maintenance reviewed ?Diet and exercise encouraged ? ?Follow up plan: ?6 months ? ? ?Mary-Margaret Hassell Done, FNP ? ?

## 2021-11-22 NOTE — Addendum Note (Signed)
Addended by: Chevis Pretty on: 11/22/2021 09:04 AM ? ? Modules accepted: Orders ? ?

## 2021-11-22 NOTE — Patient Instructions (Signed)
Chronic Obstructive Pulmonary Disease  Chronic obstructive pulmonary disease (COPD) is a long-term (chronic) lung problem. When you have COPD, it is hard for air to get in and out of your lungs. Usually the condition gets worse over time, and your lungs will never return to normal. There are things you can do to keep yourself as healthy as possible. What are the causes? Smoking. This is the most common cause. Certain genes passed from parent to child (inherited). What increases the risk? Being exposed to secondhand smoke from cigarettes, pipes, or cigars. Being exposed to chemicals and other irritants, such as fumes and dust in the work environment. Having chronic lung conditions or infections. What are the signs or symptoms? Shortness of breath, especially during physical activity. A long-term cough with a large amount of thick mucus. Sometimes, the cough may not have any mucus (dry cough). Wheezing. Breathing quickly. Skin that looks gray or blue, especially in the fingers, toes, or lips. Feeling tired (fatigue). Weight loss. Chest tightness. Having infections often. Episodes when breathing symptoms become much worse (exacerbations). At the later stages of this disease, you may have swelling in the ankles, feet, or legs. How is this treated? Taking medicines. Quitting smoking, if you smoke. Rehabilitation. This includes steps to make your body work better. It may involve a team of specialists. Doing exercises. Making changes to your diet. Using oxygen. Lung surgery. Lung transplant. Comfort measures (palliative care). Follow these instructions at home: Medicines Take over-the-counter and prescription medicines only as told by your doctor. Talk to your doctor before taking any cough or allergy medicines. You may need to avoid medicines that cause your lungs to be dry. Lifestyle If you smoke, stop smoking. Smoking makes the problem worse. Do not smoke or use any products that  contain nicotine or tobacco. If you need help quitting, ask your doctor. Avoid being around things that make your breathing worse. This may include smoke, chemicals, and fumes. Stay active, but remember to rest as well. Learn and use tips on how to manage stress and control your breathing. Make sure you get enough sleep. Most adults need at least 7 hours of sleep every night. Eat healthy foods. Eat smaller meals more often. Rest before meals. Controlled breathing Learn and use tips on how to control your breathing as told by your doctor. Try: Breathing in (inhaling) through your nose for 1 second. Then, pucker your lips and breath out (exhale) through your lips for 2 seconds. Putting one hand on your belly (abdomen). Breathe in slowly through your nose for 1 second. Your hand on your belly should move out. Pucker your lips and breathe out slowly through your lips. Your hand on your belly should move in as you breathe out.  Controlled coughing Learn and use controlled coughing to clear mucus from your lungs. Follow these steps: Lean your head a little forward. Breathe in deeply. Try to hold your breath for 3 seconds. Keep your mouth slightly open while coughing 2 times. Spit any mucus out into a tissue. Rest and do the steps again 1 or 2 times as needed. General instructions Make sure you get all the shots (vaccines) that your doctor recommends. Ask your doctor about a flu shot and a pneumonia shot. Use oxygen therapy and pulmonary rehabilitation if told by your doctor. If you need home oxygen therapy, ask your doctor if you should buy a tool to measure your oxygen level (oximeter). Make a COPD action plan with your doctor. This helps you   to know what to do if you feel worse than usual. Manage any other conditions you have as told by your doctor. Avoid going outside when it is very hot, cold, or humid. Avoid people who have a sickness you can catch (contagious). Keep all follow-up  visits. Contact a doctor if: You cough up more mucus than usual. There is a change in the color or thickness of the mucus. It is harder to breathe than usual. Your breathing is faster than usual. You have trouble sleeping. You need to use your medicines more often than usual. You have trouble doing your normal activities such as getting dressed or walking around the house. Get help right away if: You have shortness of breath while resting. You have shortness of breath that stops you from: Being able to talk. Doing normal activities. Your chest hurts for longer than 5 minutes. Your skin color is more blue than usual. Your pulse oximeter shows that you have low oxygen for longer than 5 minutes. You have a fever. You feel too tired to breathe normally. These symptoms may represent a serious problem that is an emergency. Do not wait to see if the symptoms will go away. Get medical help right away. Call your local emergency services (911 in the [place] Do not drive yourself to the hospital. Summary Chronic obstructive pulmonary disease (COPD) is a long-term lung problem. The way your lungs work will never return to normal. Usually the condition gets worse over time. There are things you can do to keep yourself as healthy as possible. Take over-the-counter and prescription medicines only as told by your doctor. If you smoke, stop. Smoking makes the problem worse. This information is not intended to replace advice given to you by your health care provider. Make sure you discuss any questions you have with your health care provider. Document Revised: 05/18/2020 Document Reviewed: 05/18/2020 Elsevier Patient Education  2023 Elsevier Inc.  

## 2021-11-23 NOTE — Progress Notes (Signed)
EPIC Encounter for ICM Monitoring ? ?Patient Name: George Barber is a 61 y.o. male ?Date: 11/23/2021 ?Primary Care Physican: Chevis Pretty, FNP ?Primary Cardiologist: Lovena Le ?Electrophysiologist: Lovena Le ?11/22/2021 Office Weight: 146 lbs ?  ?Battery ERI:  6.6 months  ?  ?Spoke with patient and heart failure questions reviewed.  Pt asymptomatic for fluid accumulation.  He reports he may have been eating foods high in salt during decreased impedance but did not have any fluid symptoms. ?  ?      CorVue thoracic impedance suggesting possible fluid accumulation starting 4/23 and returned to baseline normal 4/30. ?  ?Prescribed:  ?Furosemide 20 mg 1 tablet daily ?Spironolactone 25 mg take 1 tablet daily ?  ?Labs: ?10/31/2021 Creatinine 0.87, BUN 14, Potassium 4.7, Sodium 138, GFR 99 ?A complete set of results can be found in Results Review. ?  ?Recommendations:  No changes and encouraged to call if experiencing any fluid symptoms. ?  ?Follow-up plan: ICM clinic phone appointment on 12/26/2021.  91 day device clinic remote transmission 12/28/2021. ?  ?EP/Cardiology Office Visits:    Recall 04/29/2022 with Dr. Lovena Le.   ?  ?Copy of ICM check sent to Dr. Lovena Le. ? ?3 month ICM trend: 11/21/2021. ? ? ? ?12-14 Month ICM trend:  ? ? ? ?Rosalene Billings, RN ?11/23/2021 ?10:47 AM ? ?

## 2021-11-23 NOTE — Telephone Encounter (Signed)
Faxes have not been going through. Mandy called on Monday and gave them verbal script ?

## 2021-12-26 ENCOUNTER — Ambulatory Visit (INDEPENDENT_AMBULATORY_CARE_PROVIDER_SITE_OTHER): Payer: Commercial Managed Care - PPO

## 2021-12-26 DIAGNOSIS — I5022 Chronic systolic (congestive) heart failure: Secondary | ICD-10-CM | POA: Diagnosis not present

## 2021-12-26 DIAGNOSIS — Z95810 Presence of automatic (implantable) cardiac defibrillator: Secondary | ICD-10-CM

## 2021-12-28 ENCOUNTER — Ambulatory Visit (INDEPENDENT_AMBULATORY_CARE_PROVIDER_SITE_OTHER): Payer: Commercial Managed Care - PPO

## 2021-12-28 DIAGNOSIS — I429 Cardiomyopathy, unspecified: Secondary | ICD-10-CM

## 2021-12-28 NOTE — Progress Notes (Signed)
EPIC Encounter for ICM Monitoring  Patient Name: George Barber is a 61 y.o. male Date: 12/28/2021 Primary Care Physican: Bennie Pierini, FNP Primary Cardiologist: Ladona Ridgel Electrophysiologist: Ladona Ridgel 6/7/2023Weight: 146 lbs   Battery ERI: 5.2 months    Spoke with patient and heart failure questions reviewed.  Pt asymptomatic for fluid accumulation.           CorVue thoracic impedance suggesting normal fluid levels.   Prescribed:  Furosemide 20 mg 1 tablet daily Spironolactone 25 mg take 1 tablet daily   Labs: 10/31/2021 Creatinine 0.87, BUN 14, Potassium 4.7, Sodium 138, GFR 99 A complete set of results can be found in Results Review.   Recommendations:  No changes and encouraged to call if experiencing any fluid symptoms.   Follow-up plan: ICM clinic phone appointment on 01/30/2022.  91 day device clinic remote transmission 04/10/2022.   EP/Cardiology Office Visits:    Recall 04/29/2022 with Dr. Ladona Ridgel.     Copy of ICM check sent to Dr. Ladona Ridgel.  3 month ICM trend: 12/26/2021.    12-14 Month ICM trend:     Karie Soda, RN 12/28/2021 3:19 PM

## 2021-12-29 LAB — CUP PACEART REMOTE DEVICE CHECK
Battery Remaining Longevity: 5 mo
Battery Remaining Percentage: 5 %
Battery Voltage: 2.62 V
Brady Statistic RV Percent Paced: 1 %
Date Time Interrogation Session: 20230605135943
HighPow Impedance: 79 Ohm
HighPow Impedance: 79 Ohm
Implantable Lead Implant Date: 20120813
Implantable Lead Location: 753860
Implantable Lead Model: 180
Implantable Lead Serial Number: 303783
Implantable Pulse Generator Implant Date: 20120813
Lead Channel Impedance Value: 310 Ohm
Lead Channel Pacing Threshold Amplitude: 1.25 V
Lead Channel Pacing Threshold Pulse Width: 0.8 ms
Lead Channel Sensing Intrinsic Amplitude: 11.8 mV
Lead Channel Setting Pacing Amplitude: 2.5 V
Lead Channel Setting Pacing Pulse Width: 0.8 ms
Lead Channel Setting Sensing Sensitivity: 0.5 mV
Pulse Gen Serial Number: 1005776

## 2022-01-06 NOTE — Progress Notes (Signed)
Remote ICD transmission.   

## 2022-01-27 ENCOUNTER — Telehealth: Payer: Self-pay

## 2022-01-27 NOTE — Telephone Encounter (Signed)
I spoke with the patient and got him scheduled with Renee on 01-30-22 at 11:20 am. The patient verbalized understanding.

## 2022-01-27 NOTE — Telephone Encounter (Signed)
Merlin alert received. Device at [employer]. Patient notifier(s) delivered.  Battery triggered ERI on 01/27/2022.  Alert sent to Triage.  Follow up as scheduled. MC   Unsuccessful telephone encounter to patient to discuss ICD battery status of RRT. Hipaa compliant VM message left requesting call back to [phone].

## 2022-01-29 NOTE — Progress Notes (Addendum)
Cardiology Office Note Date:  01/29/2022  Patient ID:  George Barber 09/01/1960, MRN [id number] PCP:  Bennie Pierini, FNP  Electrophysiologist: Dr. Ladona Ridgel    Chief Complaint:  ERI  History of Present Illness: George Barber is a 61 y.o. male with history of NICM, NSVT, emphysema  He comes in today to be seen for Dr. Ladona Ridgel, last seen by him Nov 2021, amio was reduced to Mon-Sat, none on Sunday, reported class II HF symptoms.  Bp was up but felt 2/2 stress at the time.  More recently he saw A. 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  It is very labor intensive and he reports very good exertional capacity No CP, palpitations or cardiac awareness. No near syncope or syncope. No SOB, DOE  He has a rash on his R knee/leg, red, papular/not quite blistered, he reports a few on his LUE as well. He reports this happens every summer, there is a chemical that they use that he is very sensitive to and gives him this rash every year where he gets it.   Device information Abbott single chamber ICD implanted 04/06/2011 by procedure note (for unclear reasons, pacerart notes 03/06/11 and device reports 09/01/2013)  AAD Hx Amiodarone started 2012   Past Medical History:  Diagnosis Date   Elevated LFTs    Emphysema    Fluttering heart    NICM (nonischemic cardiomyopathy) (HCC)    cath 12/13/10: Normal cors, EF 10-15%;  b. echo 5/12 EF 15%, mild MR, mod LAE, mild RVE, mild to mod RAE, mild to mod TR, PASP 44   NSVT (nonsustained ventricular tachycardia) (HCC)    Life Vest; amiodarone rx   Systolic CHF, chronic (HCC)     Past Surgical History:  Procedure Laterality Date   2D ECHOCARDIOGRAM  02/2011   CARDIAC CATHETERIZATION      Current Outpatient Medications  Medication Sig Dispense Refill   albuterol (VENTOLIN HFA) 108 (90 Base)  MCG/ACT inhaler INHALE 2 PUFFS BY MOUTH EVERY 6 HOURS AS NEEDED FOR WHEEZING AND FOR SHORTNESS OF BREATH . 18 g 0   amiodarone (PACERONE) 200 MG tablet Take 1 tablet (200 mg total) by mouth daily. Monday through Saturday 90 tablet 1   aspirin 81 MG tablet Take 81 mg by mouth daily.       carvedilol (COREG) 12.5 MG tablet Take 1 tablet (12.5 mg total) by mouth 2 (two) times daily with a meal. TAKE 1 TABLET BY MOUTH TWICE DAILY WITH MEALS . APPOINTMENT REQUIRED FOR FUTURE REFILLS 180 tablet 1   furosemide (LASIX) 20 MG tablet TAKE 1 TABLET BY MOUTH ONCE DAILY . APPOINTMENT REQUIRED FOR FUTURE REFILLS 90 tablet 1   losartan (COZAAR) 100 MG tablet Take 0.5 tablets (50 mg total) by mouth daily. 45 tablet 1   spironolactone (ALDACTONE) 25 MG tablet TAKE 1 TABLET BY MOUTH ONCE DAILY . APPOINTMENT REQUIRED FOR FUTURE REFILLS 90 tablet 1   umeclidinium-vilanterol (ANORO ELLIPTA) 62.5-25 MCG/ACT AEPB Inhale 1 puff into the lungs daily. 60 each 5   No current facility-administered medications for this visit.    Allergies:   Patient has no known allergies.   Social History:  The patient  reports that he quit smoking about 11 years ago. His smoking use included cigarettes. He has a 15.00 pack-year smoking history. He has never used smokeless tobacco.  He reports that he does not drink alcohol and does not use drugs.   Family History:  The patient's family history includes Cancer (age of onset: 74) in his mother; Coronary artery disease in his father; Emphysema in his father.  ROS:  Please see the history of present illness.    All other systems are reviewed and otherwise negative.   PHYSICAL EXAM:  VS:  There were no vitals taken for this visit. BMI: There is no height or weight on file to calculate BMI. Well nourished, well developed, in no acute distress HEENT: normocephalic, atraumatic Neck: no JVD, carotid bruits or masses Cardiac:  RR; no significant murmurs, no rubs, or gallops Lungs:   CTA b/l,  no wheezing, rhonchi or rales Abd: soft, nontender MS: no deformity or atrophy Ext:  no edema Skin: chronic sun exposure skin changes face/UE espeically,  he has a rash L kne/leg, is raised, with small papules, and a couple on his L arm. Some slight blistering in appearance, no open wounds that I see Neuro:  No gross deficits appreciated Psych: euthymic mood, full affect  ICD site is stable, no tethering or discomfort   EKG:  Done today and reviewed by myself shows  SB 54bp,, PVC, QRS is 45ms  Device interrogation done today and reviewed by myself:  Battery reached ERI 01/27/22 Lead measurements stable RV lead threshold up slightly from 1.25 > 1.5/0.8 I elected not to increase outputs, he never V paces No VT, no therpaies  03/14/2011: TTE Study Conclusions  - Left ventricle: There is global hypokinesis. The cavity size was    moderately dilated. Wall thickness was normal. Systolic function    was moderately to severely reduced. The estimated ejection    fraction was in the range of 30% to 35%.  - Mitral valve: Mild regurgitation.   12/13/2010: [facility] FINAL INTERPRETATION: 1. Normal coronary anatomy. 2. Severe left ventricular dysfunction. 3. Normal right heart pressures.   Recent Labs: 05/25/2021: Hemoglobin 16.0; Platelets 299 10/31/2021: ALT 22; BUN 14; Creatinine, Ser 0.87; Potassium 4.7; Sodium 138; TSH 2.150  05/25/2021: Chol/HDL Ratio 2.3; Cholesterol, Total 162; HDL 71; LDL Chol Calc (NIH) 79; Triglycerides 59   CrCl cannot be calculated (Patient's most recent lab result is older than the maximum 21 days allowed.).   Wt Readings from Last 3 Encounters:  11/22/21 146 lb (66.2 kg)  10/31/21 141 lb 12.8 oz (64.3 kg)  05/25/21 144 lb (65.3 kg)     Other studies reviewed: Additional studies/records reviewed today include: summarized above  ASSESSMENT AND PLAN:  ICD Device is ERI  I think we should update his echo, though I think given his young age would elect to  still procede with gen change even if EF is improved. EKG is a narrow QRS, would not need any new leads.  Discussed gen change procedure, potential risks/benefits, he is agreeable to proceed. He has an apparent chemical rash to his L kee/leg especially with some on his LUE, this is not infectious by his reports, is a chemical that he is sensitive to at work and happens evert summer. Discussed that I would prefer this to be healed up/resolved prior to his procedure toi reduce infection risk for him  Advised that he wear long pants/sleeves to avoid the exposure Also advised that he wear sun screen given his amio   NICM Chronic CHF (systolic) No symptoms or exam findings of volume OL I will update his echo pre-gen change as discussed above   If  his EF is improved, discussed with he and his wife, given his young age, to proceed with gen change, they are both in agreement.  NSVT Chronic amiodarone PVC on his EKG today No therapies or arrhythmias on device Perhaps we can think about reducing his amio further. Will reach out to Dr Ladona Ridgel.   ADDEND: 04/06/22 Previously discussed with Dr. Ladona Ridgel. Plan to reduce amiodarone to 200mg  daily M-F, none on Sat/Sun.after his gen change ICD gen change completed, wound check healing well Will ask my MA to call him and reduce his dose. , PA-C   Disposition: F/u with usual post procedure appointments  Current medicines are reviewed at length with the patient today.  The patient did not have any concerns regarding medicines.  Francis Dowse, PA-C 01/29/2022 5:31 AM     [facility] [address] [place] [phone] (office)  [phone] (fax)

## 2022-01-30 ENCOUNTER — Ambulatory Visit (INDEPENDENT_AMBULATORY_CARE_PROVIDER_SITE_OTHER): Payer: Commercial Managed Care - PPO | Admitting: Physician Assistant

## 2022-01-30 ENCOUNTER — Ambulatory Visit (INDEPENDENT_AMBULATORY_CARE_PROVIDER_SITE_OTHER): Payer: Commercial Managed Care - PPO

## 2022-01-30 ENCOUNTER — Encounter: Payer: Self-pay | Admitting: Physician Assistant

## 2022-01-30 VITALS — BP 130/80 | HR 52 | Ht 70.0 in | Wt 137.6 lb

## 2022-01-30 DIAGNOSIS — Z95810 Presence of automatic (implantable) cardiac defibrillator: Secondary | ICD-10-CM

## 2022-01-30 DIAGNOSIS — I5022 Chronic systolic (congestive) heart failure: Secondary | ICD-10-CM

## 2022-01-30 DIAGNOSIS — I428 Other cardiomyopathies: Secondary | ICD-10-CM

## 2022-01-30 DIAGNOSIS — I4729 Other ventricular tachycardia: Secondary | ICD-10-CM

## 2022-01-30 DIAGNOSIS — I429 Cardiomyopathy, unspecified: Secondary | ICD-10-CM

## 2022-01-30 LAB — CUP PACEART INCLINIC DEVICE CHECK
Battery Remaining Longevity: 0 mo
Brady Statistic RV Percent Paced: 0.09 %
Date Time Interrogation Session: 20230710130803
HighPow Impedance: 75.375
Implantable Lead Implant Date: 20120813
Implantable Lead Location: 753860
Implantable Lead Model: 180
Implantable Lead Serial Number: 303783
Implantable Pulse Generator Implant Date: 20120813
Lead Channel Impedance Value: 325 Ohm
Lead Channel Pacing Threshold Amplitude: 1.5 V
Lead Channel Pacing Threshold Amplitude: 1.5 V
Lead Channel Pacing Threshold Pulse Width: 0.8 ms
Lead Channel Pacing Threshold Pulse Width: 0.8 ms
Lead Channel Sensing Intrinsic Amplitude: 11.8 mV
Lead Channel Setting Pacing Amplitude: 2.5 V
Lead Channel Setting Pacing Pulse Width: 0.8 ms
Lead Channel Setting Sensing Sensitivity: 0.5 mV
Pulse Gen Serial Number: 1005776

## 2022-01-30 LAB — CUP PACEART REMOTE DEVICE CHECK
Battery Remaining Longevity: 0 mo
Battery Voltage: 2.6 V
Brady Statistic RV Percent Paced: 1 %
Date Time Interrogation Session: 20230710020015
HighPow Impedance: 73 Ohm
HighPow Impedance: 73 Ohm
Implantable Lead Implant Date: 20120813
Implantable Lead Location: 753860
Implantable Lead Model: 180
Implantable Lead Serial Number: 303783
Implantable Pulse Generator Implant Date: 20120813
Lead Channel Impedance Value: 290 Ohm
Lead Channel Pacing Threshold Amplitude: 1.25 V
Lead Channel Pacing Threshold Pulse Width: 0.8 ms
Lead Channel Sensing Intrinsic Amplitude: 11.8 mV
Lead Channel Setting Pacing Amplitude: 2.5 V
Lead Channel Setting Pacing Pulse Width: 0.8 ms
Lead Channel Setting Sensing Sensitivity: 0.5 mV
Pulse Gen Serial Number: 1005776

## 2022-01-30 NOTE — Progress Notes (Signed)
EPIC Encounter for ICM Monitoring  Patient Name: George Barber is a 61 y.o. male Date: 01/30/2022 Primary Care Physican: Bennie Pierini, FNP Primary Cardiologist: Ladona Ridgel Electrophysiologist: Ladona Ridgel 6/7/2023Weight: 146 lbs   Battery ERI reached and replacement procedure scheduled 02/20/2022   Spoke with patient and heart failure questions reviewed.  Pt asymptomatic for fluid accumulation.  Reports feeling well at this time and voices no complaints.           CorVue thoracic impedance suggesting normal fluid levels.   Prescribed:  Furosemide 20 mg 1 tablet daily Spironolactone 25 mg take 1 tablet daily   Labs: 10/31/2021 Creatinine 0.87, BUN 14, Potassium 4.7, Sodium 138, GFR 99 A complete set of results can be found in Results Review.   Recommendations:  No changes and encouraged to call if experiencing any fluid symptoms.   Follow-up plan: ICM clinic phone appointment on 04/03/2022 following battery replacement.  91 day device clinic remote transmission 04/10/2022.   EP/Cardiology Office Visits:    Recall 04/29/2022 with Dr. Ladona Ridgel.     Copy of ICM check sent to Dr. Ladona Ridgel.  3 month ICM trend: 01/30/2022.    12-14 Month ICM trend:     Karie Soda, RN 01/30/2022 12:34 PM

## 2022-01-30 NOTE — Patient Instructions (Signed)
Medication Instructions:  Your physician recommends that you continue on your current medications as directed. Please refer to the Current Medication list given to you today.  *If you need a refill on your cardiac medications before your next appointment, please call your pharmacy*   Lab Work: TODAY: CMET, CBC, TSH, Mag  If you have labs (blood work) drawn today and your tests are completely normal, you will receive your results only by: MyChart Message (if you have MyChart) OR A paper copy in the mail If you have any lab test that is abnormal or we need to change your treatment, we will call you to review the results.   Testing/Procedures: Your physician has requested that you have an echocardiogram. Echocardiography is a painless test that uses sound waves to create images of your heart. It provides your doctor with information about the size and shape of your heart and how well your heart's chambers and valves are working. This procedure takes approximately one hour. There are no restrictions for this procedure.   Follow-Up: At [facility], you and your health needs are our priority.  As part of our continuing mission to provide you with exceptional heart care, we have created designated Provider Care Teams.  These Care Teams include your primary Cardiologist (physician) and Advanced Practice Providers (APPs -  Physician Assistants and Nurse Practitioners) who all work together to provide you with the care you need, when you need it.  Your next appointment:   We will call you to schedule  Other Instructions See letter for ICD Gen Change Instructions

## 2022-01-31 LAB — CBC WITH DIFFERENTIAL/PLATELET
Basophils Absolute: 0.1 10*3/uL (ref 0.0–0.2)
Basos: 1 %
EOS (ABSOLUTE): 0.3 10*3/uL (ref 0.0–0.4)
Eos: 4 %
Hematocrit: 44.9 % (ref 37.5–51.0)
Hemoglobin: 15.8 g/dL (ref 13.0–17.7)
Immature Grans (Abs): 0 10*3/uL (ref 0.0–0.1)
Immature Granulocytes: 0 %
Lymphocytes Absolute: 2.6 10*3/uL (ref 0.7–3.1)
Lymphs: 27 %
MCH: 36.1 pg — ABNORMAL HIGH (ref 26.6–33.0)
MCHC: 35.2 g/dL (ref 31.5–35.7)
MCV: 103 fL — ABNORMAL HIGH (ref 79–97)
Monocytes Absolute: 0.8 10*3/uL (ref 0.1–0.9)
Monocytes: 8 %
Neutrophils Absolute: 5.8 10*3/uL (ref 1.4–7.0)
Neutrophils: 60 %
Platelets: 368 10*3/uL (ref 150–450)
RBC: 4.38 x10E6/uL (ref 4.14–5.80)
RDW: 12.2 % (ref 11.6–15.4)
WBC: 9.5 10*3/uL (ref 3.4–10.8)

## 2022-01-31 LAB — COMPREHENSIVE METABOLIC PANEL
ALT: 20 IU/L (ref 0–44)
AST: 26 IU/L (ref 0–40)
Albumin/Globulin Ratio: 1.9 (ref 1.2–2.2)
Albumin: 4.7 g/dL (ref 3.8–4.9)
Alkaline Phosphatase: 64 IU/L (ref 44–121)
BUN/Creatinine Ratio: 12 (ref 10–24)
BUN: 13 mg/dL (ref 8–27)
Bilirubin Total: 0.4 mg/dL (ref 0.0–1.2)
CO2: 25 mmol/L (ref 20–29)
Calcium: 10.1 mg/dL (ref 8.6–10.2)
Chloride: 98 mmol/L (ref 96–106)
Creatinine, Ser: 1.09 mg/dL (ref 0.76–1.27)
Globulin, Total: 2.5 g/dL (ref 1.5–4.5)
Glucose: 88 mg/dL (ref 70–99)
Potassium: 4.5 mmol/L (ref 3.5–5.2)
Sodium: 138 mmol/L (ref 134–144)
Total Protein: 7.2 g/dL (ref 6.0–8.5)
eGFR: 78 mL/min/{1.73_m2} (ref >59)

## 2022-01-31 LAB — TSH: TSH: 2.41 u[IU]/mL (ref 0.450–4.500)

## 2022-01-31 LAB — MAGNESIUM: Magnesium: 1.9 mg/dL (ref 1.6–2.3)

## 2022-01-31 NOTE — Addendum Note (Signed)
Addended by: Kerrie Buffalo on: 01/31/2022 04:50 PM   Modules accepted: Orders

## 2022-02-07 ENCOUNTER — Ambulatory Visit: Payer: Commercial Managed Care - PPO

## 2022-02-07 DIAGNOSIS — I5022 Chronic systolic (congestive) heart failure: Secondary | ICD-10-CM

## 2022-02-07 DIAGNOSIS — I428 Other cardiomyopathies: Secondary | ICD-10-CM

## 2022-02-07 LAB — ECHOCARDIOGRAM COMPLETE
Area-P 1/2: 3.23 cm2
Calc EF: 52.2 %
MV M vel: 2.88 m/s
MV Peak grad: 33.1 mmHg
S' Lateral: 4.12 cm
Single Plane A2C EF: 59.8 %
Single Plane A4C EF: 45.8 %

## 2022-02-13 ENCOUNTER — Telehealth: Payer: Self-pay | Admitting: Nurse Practitioner

## 2022-02-13 DIAGNOSIS — J411 Mucopurulent chronic bronchitis: Secondary | ICD-10-CM

## 2022-02-13 MED ORDER — ANORO ELLIPTA 62.5-25 MCG/ACT IN AEPB: 1.0000 | INHALATION_SPRAY | Freq: Every day | RESPIRATORY_TRACT | 3 refills | Status: DC

## 2022-02-13 NOTE — Telephone Encounter (Signed)
Refill sent to pharmacy, patient aware ?

## 2022-02-13 NOTE — Telephone Encounter (Signed)
Lacrystal called from [facility] to let us know that they received pts Rx's but says that they are not a dispensing pharmacy. Only for fertility.

## 2022-02-14 NOTE — Telephone Encounter (Signed)
Pharmacy called to say that they do not deal with these prescriptions. Please send to different pharmacy and call patient.

## 2022-02-14 NOTE — Telephone Encounter (Signed)
Called in verbal refill to [facility] pharmacy because the fax would not go through.

## 2022-02-16 ENCOUNTER — Telehealth: Payer: Self-pay

## 2022-02-16 NOTE — Telephone Encounter (Signed)
Attempted to call Pt.  Call went to VM.  Multiple messages left.  Await call back.

## 2022-02-16 NOTE — Telephone Encounter (Signed)
Unsuccessful telephone encounter to patient at all given numbers to advise procedure for Monday 02/20/22 has been rescheduled to August 7, with additional dates available if August 7 does not work for patient. Message left requesting call back to [phone] or to please respond to MyChart message sent by Roney Mans, RN.

## 2022-02-16 NOTE — Telephone Encounter (Signed)
Left message for Pt advising that his procedure scheduled for February 20, 2022 at 2:30 pm needs to be rescheduled.  Procedure has been moved to August 7 to make appointment spot for urgent add on.  Need to discuss what alternative day is available for this Pt procedure.  Will also send mychart message.

## 2022-02-17 NOTE — Telephone Encounter (Signed)
Pt is aware of his procedure being moved out further. He is currently scheduled for 8/7 with arrival time of 2:00. He would like an earlier time of day if anything is available. He is aware of Boneta Lucks being out of the office today and she will reach out to him next week with updated information.

## 2022-02-21 NOTE — Telephone Encounter (Signed)
Left message requesting call back to reschedule gen change.  March 23, 2022 available.

## 2022-02-23 NOTE — Telephone Encounter (Signed)
Left message requesting call back to reschedule gen change.   March 23, 2022 available.

## 2022-02-24 NOTE — Telephone Encounter (Signed)
Spoke with Pt's wife.  Ok for gen change on March 23, 2022.  Updated instruction letter and mailed to Pt.  Work up complete.

## 2022-03-01 NOTE — Progress Notes (Signed)
Remote ICD transmission.   

## 2022-03-06 ENCOUNTER — Ambulatory Visit: Payer: Commercial Managed Care - PPO

## 2022-03-08 ENCOUNTER — Ambulatory Visit: Payer: Commercial Managed Care - PPO

## 2022-03-08 LAB — CUP PACEART REMOTE DEVICE CHECK
Battery Remaining Longevity: 0 mo
Battery Voltage: 2.6 V
Brady Statistic RV Percent Paced: 1 %
Date Time Interrogation Session: 20230815104937
HighPow Impedance: 68 Ohm
HighPow Impedance: 68 Ohm
Implantable Lead Implant Date: 20120813
Implantable Lead Location: 753860
Implantable Lead Model: 180
Implantable Lead Serial Number: 303783
Implantable Pulse Generator Implant Date: 20120813
Lead Channel Impedance Value: 310 Ohm
Lead Channel Pacing Threshold Amplitude: 1.5 V
Lead Channel Pacing Threshold Pulse Width: 0.8 ms
Lead Channel Sensing Intrinsic Amplitude: 11.8 mV
Lead Channel Setting Pacing Amplitude: 2.5 V
Lead Channel Setting Pacing Pulse Width: 0.8 ms
Lead Channel Setting Sensing Sensitivity: 0.5 mV
Pulse Gen Serial Number: 1005776

## 2022-03-13 ENCOUNTER — Telehealth: Payer: Self-pay | Admitting: Internal Medicine

## 2022-03-13 ENCOUNTER — Telehealth: Payer: Self-pay | Admitting: *Deleted

## 2022-03-13 NOTE — Telephone Encounter (Signed)
Patient is calling about the procedure he has schedule for 8/31.  He states a George Barber called him stating it needs to be rescheduled.

## 2022-03-13 NOTE — Telephone Encounter (Signed)
Spoke to the patient and let him know to be at the hospital at 11:00am on 8/31 due to change in Dr. Lubertha Basque schedule.

## 2022-03-22 NOTE — Pre-Procedure Instructions (Signed)
Attempted to call patient regarding procedure instructions.  Left voice mail Instructed patient on the following items: Arrival time 1330 Nothing to eat or drink after midnight No meds AM of procedure Responsible person to drive you home and stay with you for 24 hrs Wash with special soap night before and morning of procedure

## 2022-03-23 ENCOUNTER — Other Ambulatory Visit: Payer: Self-pay

## 2022-03-23 ENCOUNTER — Ambulatory Visit (HOSPITAL_COMMUNITY)
Admission: RE | Disposition: A | Discharge status: Home / Self Care | Payer: Commercial Managed Care - PPO | Source: Home / Self Care | Attending: Internal Medicine

## 2022-03-23 ENCOUNTER — Ambulatory Visit (HOSPITAL_COMMUNITY)
Admission: RE | Admit: 2022-03-23 | Discharge: 2022-03-23 | Disposition: A | Discharge status: Home / Self Care | Payer: Commercial Managed Care - PPO | Attending: Internal Medicine | Admitting: Internal Medicine

## 2022-03-23 DIAGNOSIS — I255 Ischemic cardiomyopathy: Secondary | ICD-10-CM

## 2022-03-23 DIAGNOSIS — Z87891 Personal history of nicotine dependence: Secondary | ICD-10-CM | POA: Diagnosis not present

## 2022-03-23 DIAGNOSIS — I5022 Chronic systolic (congestive) heart failure: Secondary | ICD-10-CM | POA: Diagnosis not present

## 2022-03-23 DIAGNOSIS — Z4502 Encounter for adjustment and management of automatic implantable cardiac defibrillator: Secondary | ICD-10-CM

## 2022-03-23 DIAGNOSIS — I252 Old myocardial infarction: Secondary | ICD-10-CM | POA: Diagnosis not present

## 2022-03-23 DIAGNOSIS — I110 Hypertensive heart disease with heart failure: Secondary | ICD-10-CM | POA: Diagnosis not present

## 2022-03-23 HISTORY — PX: ICD GENERATOR CHANGEOUT: EP1231

## 2022-03-23 LAB — BASIC METABOLIC PANEL
Anion gap: 10 (ref 5–15)
BUN: 10 mg/dL (ref 6–20)
CO2: 25 mmol/L (ref 22–32)
Calcium: 9.6 mg/dL (ref 8.9–10.3)
Chloride: 103 mmol/L (ref 98–111)
Creatinine, Ser: 1.14 mg/dL (ref 0.61–1.24)
GFR, Estimated: >60 mL/min (ref >60)
Glucose, Bld: 87 mg/dL (ref 70–99)
Potassium: 4 mmol/L (ref 3.5–5.1)
Sodium: 138 mmol/L (ref 135–145)

## 2022-03-23 LAB — CBC
HCT: 45.3 % (ref 39.0–52.0)
Hemoglobin: 15.9 g/dL (ref 13.0–17.0)
MCH: 36.1 pg — ABNORMAL HIGH (ref 26.0–34.0)
MCHC: 35.1 g/dL (ref 30.0–36.0)
MCV: 103 fL — ABNORMAL HIGH (ref 80.0–100.0)
Platelets: 335 10*3/uL (ref 150–400)
RBC: 4.4 MIL/uL (ref 4.22–5.81)
RDW: 12.4 % (ref 11.5–15.5)
WBC: 12 10*3/uL — ABNORMAL HIGH (ref 4.0–10.5)
nRBC: 0 % (ref 0.0–0.2)

## 2022-03-23 SURGERY — ICD GENERATOR CHANGEOUT

## 2022-03-23 MED ORDER — SODIUM CHLORIDE 0.9 % IV SOLN: INTRAVENOUS | Status: DC

## 2022-03-23 MED ORDER — SODIUM CHLORIDE 0.9 % IV SOLN
80.0000 mg | INTRAVENOUS | Status: AC
  Administered 2022-03-23: 80 mg

## 2022-03-23 MED ORDER — MIDAZOLAM HCL 5 MG/5ML IJ SOLN
INTRAMUSCULAR | Status: AC
  Filled 2022-03-23: qty 5

## 2022-03-23 MED ORDER — CEFAZOLIN SODIUM-DEXTROSE 2-4 GM/100ML-% IV SOLN
INTRAVENOUS | Status: AC
  Filled 2022-03-23: qty 100

## 2022-03-23 MED ORDER — CEFAZOLIN SODIUM-DEXTROSE 2-4 GM/100ML-% IV SOLN
2.0000 g | INTRAVENOUS | Status: AC
  Administered 2022-03-23: 2 g via INTRAVENOUS

## 2022-03-23 MED ORDER — FENTANYL CITRATE (PF) 100 MCG/2ML IJ SOLN
INTRAMUSCULAR | Status: AC
  Filled 2022-03-23: qty 2

## 2022-03-23 MED ORDER — ONDANSETRON HCL 4 MG/2ML IJ SOLN: 4.0000 mg | Freq: Four times a day (QID) | INTRAMUSCULAR | Status: DC | PRN

## 2022-03-23 MED ORDER — FENTANYL CITRATE (PF) 100 MCG/2ML IJ SOLN
INTRAMUSCULAR | Status: DC | PRN
  Administered 2022-03-23 (×3): 25 ug via INTRAVENOUS

## 2022-03-23 MED ORDER — CHLORHEXIDINE GLUCONATE 4 % EX LIQD: 4.0000 | Freq: Once | CUTANEOUS | Status: DC

## 2022-03-23 MED ORDER — MIDAZOLAM HCL 5 MG/5ML IJ SOLN
INTRAMUSCULAR | Status: DC | PRN
  Administered 2022-03-23 (×5): 2 mg via INTRAVENOUS

## 2022-03-23 MED ORDER — ACETAMINOPHEN 325 MG PO TABS: 325.0000 mg | ORAL_TABLET | ORAL | Status: DC | PRN

## 2022-03-23 MED ORDER — LIDOCAINE HCL (PF) 1 % IJ SOLN
INTRAMUSCULAR | Status: AC
  Filled 2022-03-23: qty 60

## 2022-03-23 MED ORDER — POVIDONE-IODINE 10 % EX SWAB
2.0000 | Freq: Once | CUTANEOUS | Status: AC
  Administered 2022-03-23: 2 via TOPICAL

## 2022-03-23 MED ORDER — SODIUM CHLORIDE 0.9 % IV SOLN
INTRAVENOUS | Status: AC
  Filled 2022-03-23: qty 2

## 2022-03-23 SURGICAL SUPPLY — 4 items
CABLE SURGICAL S-101-97-12 (CABLE) ×1 IMPLANT
ICD ELLIPSE VR CD1411-36C (ICD Generator) IMPLANT
PAD DEFIB RADIO PHYSIO CONN (PAD) ×1 IMPLANT
TRAY PACEMAKER INSERTION (PACKS) ×1 IMPLANT

## 2022-03-23 NOTE — Discharge Instructions (Signed)

## 2022-03-23 NOTE — H&P (Signed)
HPI George Barber returns today for followup. He is a very pleasant 61 year old man with a history of ventricular tachycardia, status post ICD insertion. He has a nonischemic cardiomyopathy with chronic systolic heart failure currently class II. In the interim, he has been stable. He denies chest pain or syncope. No peripheral edema. No ICD shocks. He is still working and also helps take care of his grandchildren.    No Known Allergies           Current Outpatient Medications  Medication Sig Dispense Refill   albuterol (VENTOLIN HFA) 108 (90 Base) MCG/ACT inhaler INHALE 2 PUFFS BY MOUTH EVERY 6 HOURS AS NEEDED FOR WHEEZING AND FOR SHORTNESS OF BREATH . 18 g 0   amiodarone (PACERONE) 200 MG tablet Take 1 tablet (200 mg total) by mouth daily. Please keep upcoming appt in November with Dr. Ladona Ridgel before anymore refills. Thank you 90 tablet 0   aspirin 81 MG tablet Take 81 mg by mouth daily.         carvedilol (COREG) 12.5 MG tablet Take 1 tablet (12.5 mg total) by mouth 2 (two) times daily with a meal. TAKE 1 TABLET BY MOUTH TWICE DAILY WITH MEALS . APPOINTMENT REQUIRED FOR FUTURE REFILLS 180 tablet 3   furosemide (LASIX) 20 MG tablet Take 1 tablet (20 mg total) by mouth daily. 30 tablet 2   losartan (COZAAR) 100 MG tablet Take 0.5 tablets (50 mg total) by mouth daily. Please keep upcoming appt in November with Dr. Ladona Ridgel before anymore refills. Thank you 45 tablet 0   spironolactone (ALDACTONE) 25 MG tablet Take 1 tablet (25 mg total) by mouth daily. 30 tablet 2   umeclidinium-vilanterol (ANORO ELLIPTA) 62.5-25 MCG/INH AEPB Inhale 1 puff by mouth once daily 180 each 1    No current facility-administered medications for this visit.            Past Medical History:  Diagnosis Date   Elevated LFTs     Emphysema     Fluttering heart     NICM (nonischemic cardiomyopathy) (HCC)      cath 12/13/10: Normal cors, EF 10-15%;  b. echo 5/12 EF 15%, mild MR, mod LAE, mild RVE, mild to mod RAE,  mild to mod TR, PASP 44   NSVT (nonsustained ventricular tachycardia) (HCC)      Life Vest; amiodarone rx   Systolic CHF, chronic (HCC)        ROS:    All systems reviewed and negative except as noted in the HPI.          Past Surgical History:  Procedure Laterality Date   2D ECHOCARDIOGRAM   02/2011   CARDIAC CATHETERIZATION                 Family History  Problem Relation Age of Onset   Cancer Mother 25   Coronary artery disease Father          unknown   Emphysema Father          Social History         Socioeconomic History   Marital status: Married      Spouse name: Not on file   Number of children: 1   Years of education: Not on file   Highest education level: Not on file  Occupational History   Occupation: ELECTRICAL      Employer: [employer]  Tobacco Use   Smoking status: Former Smoker  Packs/day: 1.00      Years: 15.00      Pack years: 15.00      Quit date: 10/23/2010      Years since quitting: 9.6   Smokeless tobacco: Never Used  Substance and Sexual Activity   Alcohol use: No   Drug use: No   Sexual activity: Not on file  Other Topics Concern   Not on file  Social History Narrative     George Barber is married and lives with his wife.  They have      1 child together.  He is an Personnel officer.  He smoked for 20 years and      quit 1 month ago.  He quit alcohol 1 month ago, after a 20-year history      of what he describes as social alcohol, but does endorse drinking 6-8      beers per day.           Social Determinants of Health       Financial Resource Strain:    Difficulty of Paying Living Expenses: Not on file  Food Insecurity:    Worried About Programme researcher, broadcasting/film/video in the Last Year: Not on file   [employer] of Food in the Last Year: Not on file  Transportation Needs:    Lack of Transportation (Medical): Not on file   Lack of Transportation (Non-Medical): Not on file  Physical Activity:    Days of Exercise per Week: Not on file    Minutes of Exercise per Session: Not on file  Stress:    Feeling of Stress : Not on file  Social Connections:    Frequency of Communication with Friends and Family: Not on file   Frequency of Social Gatherings with Friends and Family: Not on file   Attends Religious Services: Not on file   Active Member of Clubs or Organizations: Not on file   Attends Banker Meetings: Not on file   Marital Status: Not on file  Intimate Partner Violence:    Fear of Current or Ex-Partner: Not on file   Emotionally Abused: Not on file   Physically Abused: Not on file   Sexually Abused: Not on file        BP (!) 164/88   Pulse 62   Ht 5\' 10"  (1.778 m)   Wt 139 lb 6.4 oz (63.2 kg)   SpO2 90%   BMI 20.00 kg/m    Physical Exam:   Well appearing NAD HEENT: Unremarkable Neck:  No JVD, no thyromegally Lymphatics:  No adenopathy Back:  No CVA tenderness Lungs:  Clear with no wheezes HEART:  Regular rate rhythm, no murmurs, no rubs, no clicks Abd:  soft, positive bowel sounds, no organomegally, no rebound, no guarding Ext:  2 plus pulses, no edema, no cyanosis, no clubbing Skin:  No rashes no nodules Neuro:  CN II through XII intact, motor grossly intact   EKG - nsr with LAE and poor R wave progression   DEVICE  Normal device function.  See PaceArt for details.    Assess/Plan: 1. VT - he has done well with no recurrent episodes. He has been instructed to reduce his dose of amiodarone to 200 mg daily, none on Sunday. I would recommend further reducing to 5 days a week in a year if his VT remains quiet. 2. Chronic systolic heart failure -his symptoms remain class 2. He will continue his current meds. He is encouraged to reduce his  salt intake. 3. HTN - his SBP is up but he notes a lot of stress driving to our office today from [place]. 4. ICD - His St. Jude single chamber ICD is working normally. He has reached ERI. He will undergo gen change and I have discussed the treatment  options with the patient and he wishes to proceed.   Sharlot Gowda George Dilone,MD

## 2022-03-23 NOTE — Progress Notes (Signed)
Patient and wife was given discharge instructions. Both verbalized understanding. 

## 2022-03-24 ENCOUNTER — Encounter (HOSPITAL_COMMUNITY): Payer: Self-pay | Admitting: Internal Medicine

## 2022-03-27 MED FILL — Midazolam HCl Inj 5 MG/5ML (Base Equivalent): INTRAMUSCULAR | Qty: 5 | Status: AC

## 2022-04-05 ENCOUNTER — Ambulatory Visit: Payer: Commercial Managed Care - PPO | Attending: Internal Medicine

## 2022-04-05 DIAGNOSIS — I5022 Chronic systolic (congestive) heart failure: Secondary | ICD-10-CM

## 2022-04-05 LAB — CUP PACEART INCLINIC DEVICE CHECK
Battery Remaining Longevity: 104 mo
Brady Statistic RV Percent Paced: 0.41 %
Date Time Interrogation Session: 20230913084800
HighPow Impedance: 73.125
Implantable Lead Implant Date: 20120813
Implantable Lead Location: 753860
Implantable Lead Model: 180
Implantable Lead Serial Number: 303783
Implantable Pulse Generator Implant Date: 20230831
Lead Channel Impedance Value: 287.5 Ohm
Lead Channel Pacing Threshold Amplitude: 1 V
Lead Channel Pacing Threshold Amplitude: 1 V
Lead Channel Pacing Threshold Pulse Width: 0.8 ms
Lead Channel Pacing Threshold Pulse Width: 0.8 ms
Lead Channel Sensing Intrinsic Amplitude: 11.7 mV
Lead Channel Setting Pacing Amplitude: 2.5 V
Lead Channel Setting Pacing Pulse Width: 0.8 ms
Lead Channel Setting Sensing Sensitivity: 0.5 mV
Pulse Gen Serial Number: 8943728

## 2022-04-05 NOTE — Patient Instructions (Signed)
   After Your ICD (Implantable Cardiac Defibrillator)    Monitor your defibrillator site for redness, swelling, and drainage. Call the device clinic at [phone] if you experience these symptoms or fever/chills.  Your incision was closed with Steri-strips or staples:  You may shower 7 days after your procedure and wash your incision with soap and water. Avoid lotions, ointments, or perfumes over your incision until it is well-healed.  You may use a hot tub or a pool after your wound check appointment if the incision is completely closed.   Your ICD is designed to protect you from life threatening heart rhythms. Because of this, you may receive a shock.   1 shock with no symptoms:  Call the office during business hours. 1 shock with symptoms (chest pain, chest pressure, dizziness, lightheadedness, shortness of breath, overall feeling unwell):  Call 911. If you experience 2 or more shocks in 24 hours:  Call 911. If you receive a shock, you should not drive.  [place] DMV - no driving for 6 months if you receive appropriate therapy from your ICD.   ICD Alerts:  Some alerts are vibratory and others beep. These are NOT emergencies. Please call our office to let us know. If this occurs at night or on weekends, it can wait until the next business day. Send a remote transmission.  If your device is capable of reading fluid status (for heart failure), you will be offered monthly monitoring to review this with you.   Remote monitoring is used to monitor your ICD from home. This monitoring is scheduled every 91 days by our office. It allows us to keep an eye on the functioning of your device to ensure it is working properly. You will routinely see your Electrophysiologist annually (more often if necessary).  

## 2022-04-05 NOTE — Progress Notes (Signed)
Wound check appointment. Steri-strips removed. Wound without redness or edema. Incision edges approximated, wound well healed. Normal device function. Thresholds, sensing, and impedances consistent with implant measurements. Device programmed at 3.5V for extra safety margin until 3 month visit. Histogram distribution appropriate for patient and level of activity. One ventricular arrhythmia noted-see attachment. Patient educated about wound care, arm mobility, lifting restrictions, shock plan. ROV in 3 months with implanting physician.

## 2022-04-06 ENCOUNTER — Telehealth: Payer: Self-pay | Admitting: *Deleted

## 2022-04-06 DIAGNOSIS — I429 Cardiomyopathy, unspecified: Secondary | ICD-10-CM

## 2022-04-06 MED ORDER — AMIODARONE HCL 200 MG PO TABS: 200.0000 mg | ORAL_TABLET | Freq: Every day | ORAL | 1 refills | Status: DC

## 2022-04-06 NOTE — Telephone Encounter (Signed)
-----   Message from [facility], [place] sent at 04/06/2022  3:34 PM EDT ----- See my addendum, to the note in July, lets reduce his amiodarone to 200mg  daily Monday-Friday, none on Sat/Sun.  Thanks

## 2022-04-06 NOTE — Telephone Encounter (Signed)
Attempted home phone un able to leave a message and lvm om cell to call back and discuss amiodarone changes

## 2022-05-08 ENCOUNTER — Ambulatory Visit (INDEPENDENT_AMBULATORY_CARE_PROVIDER_SITE_OTHER): Payer: Commercial Managed Care - PPO

## 2022-05-08 DIAGNOSIS — I5022 Chronic systolic (congestive) heart failure: Secondary | ICD-10-CM | POA: Diagnosis not present

## 2022-05-08 DIAGNOSIS — Z95810 Presence of automatic (implantable) cardiac defibrillator: Secondary | ICD-10-CM

## 2022-05-10 ENCOUNTER — Telehealth: Payer: Self-pay

## 2022-05-10 NOTE — Progress Notes (Signed)
Spoke with patient and heart failure questions reviewed.  Transmission results reviewed.  Pt asymptomatic for fluid accumulation.  Reports feeling well at this time and voices no complaints.  Weight is stable at 146 lbs.  Advised next ICM remote transmission is 06/12/2022

## 2022-05-10 NOTE — Progress Notes (Signed)
EPIC Encounter for ICM Monitoring  Patient Name: George Barber is a 61 y.o. male Date: 05/10/2022 Primary Care Physican: Chevis Pretty, [place] Primary Cardiologist: Lovena Le Electrophysiologist: Lovena Le 6/7/2023Weight: 146 lbs   Attempted call to patient and unable to reach.  Left message to return call. Transmission reviewed.          CorVue thoracic impedance suggesting normal fluid levels.   Prescribed:  Furosemide 20 mg 1 tablet daily Spironolactone 25 mg take 1 tablet daily   Labs: 10/31/2021 Creatinine 0.87, BUN 14, Potassium 4.7, Sodium 138, GFR 99 A complete set of results can be found in Results Review.   Recommendations:  Unable to reach.     Follow-up plan: ICM clinic phone appointment on 06/12/2022.  91 day device clinic remote transmission 07/05/2022.   EP/Cardiology Office Visits:   06/27/2022 with Dr. Lovena Le.     Copy of ICM check sent to Dr. Lovena Le.  3 month ICM trend: 05/08/2022.    12-14 Month ICM trend:     Rosalene Billings, RN 05/10/2022 2:57 PM

## 2022-05-10 NOTE — Telephone Encounter (Signed)
Remote ICM transmission received.  Attempted call to patient regarding ICM remote transmission and left message to return call   

## 2022-05-25 ENCOUNTER — Ambulatory Visit (INDEPENDENT_AMBULATORY_CARE_PROVIDER_SITE_OTHER): Payer: Commercial Managed Care - PPO | Admitting: Nurse Practitioner

## 2022-05-25 ENCOUNTER — Encounter: Payer: Self-pay | Admitting: Nurse Practitioner

## 2022-05-25 VITALS — BP 149/86 | HR 54 | Temp 97.4°F | Resp 20 | Ht 70.0 in | Wt 143.0 lb

## 2022-05-25 DIAGNOSIS — I5022 Chronic systolic (congestive) heart failure: Secondary | ICD-10-CM

## 2022-05-25 DIAGNOSIS — R7989 Other specified abnormal findings of blood chemistry: Secondary | ICD-10-CM

## 2022-05-25 DIAGNOSIS — I10 Essential (primary) hypertension: Secondary | ICD-10-CM | POA: Diagnosis not present

## 2022-05-25 DIAGNOSIS — Z125 Encounter for screening for malignant neoplasm of prostate: Secondary | ICD-10-CM

## 2022-05-25 DIAGNOSIS — I429 Cardiomyopathy, unspecified: Secondary | ICD-10-CM | POA: Diagnosis not present

## 2022-05-25 DIAGNOSIS — Z95810 Presence of automatic (implantable) cardiac defibrillator: Secondary | ICD-10-CM | POA: Diagnosis not present

## 2022-05-25 DIAGNOSIS — J411 Mucopurulent chronic bronchitis: Secondary | ICD-10-CM

## 2022-05-25 MED ORDER — LOSARTAN POTASSIUM 100 MG PO TABS: 50.0000 mg | ORAL_TABLET | Freq: Every day | ORAL | 1 refills | Status: DC

## 2022-05-25 MED ORDER — ANORO ELLIPTA 62.5-25 MCG/ACT IN AEPB: 1.0000 | INHALATION_SPRAY | Freq: Every day | RESPIRATORY_TRACT | 3 refills | Status: DC

## 2022-05-25 MED ORDER — FUROSEMIDE 20 MG PO TABS: ORAL_TABLET | ORAL | 1 refills | Status: DC

## 2022-05-25 MED ORDER — SPIRONOLACTONE 25 MG PO TABS: ORAL_TABLET | ORAL | 1 refills | Status: DC

## 2022-05-25 MED ORDER — CARVEDILOL 12.5 MG PO TABS
12.5000 mg | ORAL_TABLET | Freq: Two times a day (BID) | ORAL | 1 refills | Status: DC
Start: 2022-05-25 — End: 2022-11-23

## 2022-05-25 NOTE — Progress Notes (Signed)
Subjective:    Patient ID: George Barber, male    DOB: [birth year], 61 y.o.   MRN: [id number]   Chief Complaint: medical management of chronic issues   HPI:  George Barber is a 61 y.o. who identifies as a male who was assigned male at birth.   Social history: Lives with: wife Work history: Clinical biochemist   Comes in today for follow up of the following chronic medical issues:  1. Primary hypertension No c/o chest pain, sob, or headaches. Does not check BP at home. BP Readings from Last 3 Encounters:  05/25/22 (!) 149/86  03/23/22 129/83  01/30/22 130/80     2. Cardiomyopathy, secondary ([place]) 3. Automatic implantable cardioverter-defibrillator in situ 4. Chronic systolic heart failure ([place]) Followed by cardiology; had ICD gen change on 03/23/22; no issues. Has next follow-up with cardiology in December.  Most recent echo done 02/07/22 showed LVEF 45-50% which is improved from previous. No chest pain, or s/s of fluid volume overload.  5. Mucopurulent chronic bronchitis ([place]) Doing well on daily anoro; states he has not had ventolin inhaler in months. Has occasional cough. Does not smoke.  6. Elevated LFTs Drinks a few beers a week. Lab Results  Component Value Date   ALT 20 01/30/2022   AST 26 01/30/2022   ALKPHOS 64 01/30/2022   BILITOT 0.4 01/30/2022      New complaints: None today  No Known Allergies Outpatient Encounter Medications as of 05/25/2022  Medication Sig   albuterol (VENTOLIN HFA) 108 (90 Base) MCG/ACT inhaler INHALE 2 PUFFS BY MOUTH EVERY 6 HOURS AS NEEDED FOR WHEEZING AND FOR SHORTNESS OF BREATH .   amiodarone (PACERONE) 200 MG tablet Take 1 tablet (200 mg total) by mouth daily. Only Monday through Friday NO SAT/SUN   aspirin 81 MG tablet Take 81 mg by mouth daily.     carvedilol (COREG) 12.5 MG tablet Take 1 tablet (12.5 mg total) by mouth 2 (two) times daily with a meal. TAKE 1 TABLET BY MOUTH TWICE DAILY WITH MEALS . APPOINTMENT REQUIRED FOR  FUTURE REFILLS   furosemide (LASIX) 20 MG tablet TAKE 1 TABLET BY MOUTH ONCE DAILY . APPOINTMENT REQUIRED FOR FUTURE REFILLS   losartan (COZAAR) 100 MG tablet Take 0.5 tablets (50 mg total) by mouth daily.   spironolactone (ALDACTONE) 25 MG tablet TAKE 1 TABLET BY MOUTH ONCE DAILY . APPOINTMENT REQUIRED FOR FUTURE REFILLS   umeclidinium-vilanterol (ANORO ELLIPTA) 62.5-25 MCG/ACT AEPB Inhale 1 puff into the lungs daily.   No facility-administered encounter medications on file as of 05/25/2022.    Past Surgical History:  Procedure Laterality Date   2D ECHOCARDIOGRAM  02/2011   CARDIAC CATHETERIZATION     ICD GENERATOR CHANGEOUT N/A 03/23/2022   Procedure: ICD GENERATOR CHANGEOUT;  Surgeon: Evans Lance, MD;  Location: [place];  Service: Cardiovascular;  Laterality: N/A;    Family History  Problem Relation Age of Onset   Cancer Mother 71   Coronary artery disease Father        unknown   Emphysema Father       Controlled substance contract: n/a     Review of Systems  Constitutional:  Negative for appetite change and fatigue.  Respiratory:  Negative for chest tightness and shortness of breath.   Cardiovascular:  Negative for chest pain, palpitations and leg swelling.  Gastrointestinal:  Negative for abdominal pain.  Endocrine: Negative for polydipsia and polyphagia.  Genitourinary:  Negative for difficulty urinating.  Skin:  Negative  for rash.  Neurological:  Negative for dizziness, weakness and headaches.  Hematological:  Negative for adenopathy. Does not bruise/bleed easily.  All other systems reviewed and are negative.      Objective:   Physical Exam Vitals and nursing note reviewed.  Constitutional:      General: He is not in acute distress.    Appearance: Normal appearance. He is well-developed. He is not ill-appearing.  HENT:     Head: Normocephalic and atraumatic.     Right Ear: Tympanic membrane, ear canal and external ear normal.     Left Ear:  Tympanic membrane, ear canal and external ear normal.     Nose: Nose normal.     Mouth/Throat:     Mouth: Mucous membranes are moist.     Pharynx: Oropharynx is clear.  Eyes:     Conjunctiva/sclera: Conjunctivae normal.     Pupils: Pupils are equal, round, and reactive to light.  Neck:     Thyroid: No thyroid mass, thyromegaly or thyroid tenderness.     Vascular: No carotid bruit or JVD.  Cardiovascular:     Rate and Rhythm: Regular rhythm. Bradycardia present.     Pulses: Normal pulses.  Pulmonary:     Effort: Pulmonary effort is normal. No respiratory distress.     Breath sounds: Normal breath sounds. No wheezing, rhonchi or rales.  Abdominal:     General: Abdomen is flat. Bowel sounds are normal.     Palpations: Abdomen is soft. There is no mass.     Tenderness: There is no abdominal tenderness.  Musculoskeletal:        General: Normal range of motion.     Cervical back: Normal range of motion.     Right lower leg: No edema.     Left lower leg: No edema.  Lymphadenopathy:     Cervical: No cervical adenopathy.  Skin:    General: Skin is warm and dry.     Capillary Refill: Capillary refill takes less than 2 seconds.  Neurological:     General: No focal deficit present.     Mental Status: He is alert and oriented to person, place, and time.  Psychiatric:        Mood and Affect: Mood normal.        Behavior: Behavior normal.       BP (!) 149/86   Pulse (!) 54   Temp (!) 97.4 F (36.3 C) (Temporal)   Resp 20   Ht _0  (1.778 m)   Wt 143 lb (64.9 kg)   SpO2 98%   BMI 20.52 kg/m      Assessment & Plan:   George Barber comes in today with chief complaint of Medical Management of Chronic Issues   Diagnosis and orders addressed:  1. Primary hypertension Low sodium diet. - losartan (COZAAR) 100 MG tablet; Take 0.5 tablets (50 mg total) by mouth daily.  Dispense: 45 tablet; Refill: 1 - CBC with Differential/Platelet - CMP14+EGFR - Lipid panel  2.  Cardiomyopathy, secondary ([place]) - carvedilol (COREG) 12.5 MG tablet; Take 1 tablet (12.5 mg total) by mouth 2 (two) times daily with a meal. TAKE 1 TABLET BY MOUTH TWICE DAILY WITH MEALS . APPOINTMENT REQUIRED FOR FUTURE REFILLS  Dispense: 180 tablet; Refill: 1 3. Automatic implantable cardioverter-defibrillator in situ 4. Chronic systolic heart failure ([place]) Keep follow-up with cardiology. Report chest pain, any changes. - furosemide (LASIX) 20 MG tablet; TAKE 1 TABLET BY MOUTH ONCE DAILY . APPOINTMENT REQUIRED FOR  FUTURE REFILLS  Dispense: 90 tablet; Refill: 1 - spironolactone (ALDACTONE) 25 MG tablet; TAKE 1 TABLET BY MOUTH ONCE DAILY . APPOINTMENT REQUIRED FOR FUTURE REFILLS  Dispense: 90 tablet; Refill: 1  5. Mucopurulent chronic bronchitis (HCC) Avoid cigarette smoke. Continue current plan. - umeclidinium-vilanterol (ANORO ELLIPTA) 62.5-25 MCG/ACT AEPB; Inhale 1 puff into the lungs daily.  Dispense: 60 each; Refill: 3  6. Elevated LFTs Labs pending.  Keep alcohol intake to a minimum.  7. Prostate cancer screening Labs pending. - PSA, total and free   Labs pending Health Maintenance reviewed Diet and exercise encouraged  Follow up plan: 6 months  George Leyden, FNP student   Chevis Pretty, FNP

## 2022-05-26 LAB — CBC WITH DIFFERENTIAL/PLATELET
Basophils Absolute: 0.1 10*3/uL (ref 0.0–0.2)
Basos: 1 %
EOS (ABSOLUTE): 0.3 10*3/uL (ref 0.0–0.4)
Eos: 3 %
Hematocrit: 44.2 % (ref 37.5–51.0)
Hemoglobin: 15.3 g/dL (ref 13.0–17.7)
Immature Grans (Abs): 0 10*3/uL (ref 0.0–0.1)
Immature Granulocytes: 0 %
Lymphocytes Absolute: 2.3 10*3/uL (ref 0.7–3.1)
Lymphs: 26 %
MCH: 34.8 pg — ABNORMAL HIGH (ref 26.6–33.0)
MCHC: 34.6 g/dL (ref 31.5–35.7)
MCV: 101 fL — ABNORMAL HIGH (ref 79–97)
Monocytes Absolute: 0.8 10*3/uL (ref 0.1–0.9)
Monocytes: 10 %
Neutrophils Absolute: 5.1 10*3/uL (ref 1.4–7.0)
Neutrophils: 60 %
Platelets: 370 10*3/uL (ref 150–450)
RBC: 4.4 x10E6/uL (ref 4.14–5.80)
RDW: 11.7 % (ref 11.6–15.4)
WBC: 8.5 10*3/uL (ref 3.4–10.8)

## 2022-05-26 LAB — CMP14+EGFR
ALT: 18 IU/L (ref 0–44)
AST: 25 IU/L (ref 0–40)
Albumin/Globulin Ratio: 2.1 (ref 1.2–2.2)
Albumin: 4.5 g/dL (ref 3.8–4.9)
Alkaline Phosphatase: 78 IU/L (ref 44–121)
BUN/Creatinine Ratio: 14 (ref 10–24)
BUN: 14 mg/dL (ref 8–27)
Bilirubin Total: 0.4 mg/dL (ref 0.0–1.2)
CO2: 24 mmol/L (ref 20–29)
Calcium: 9.7 mg/dL (ref 8.6–10.2)
Chloride: 99 mmol/L (ref 96–106)
Creatinine, Ser: 1.01 mg/dL (ref 0.76–1.27)
Globulin, Total: 2.1 g/dL (ref 1.5–4.5)
Glucose: 89 mg/dL (ref 70–99)
Potassium: 4.9 mmol/L (ref 3.5–5.2)
Sodium: 137 mmol/L (ref 134–144)
Total Protein: 6.6 g/dL (ref 6.0–8.5)
eGFR: 85 mL/min/{1.73_m2} (ref >59)

## 2022-05-26 LAB — PSA, TOTAL AND FREE
PSA, Free Pct: 18 %
PSA, Free: 0.27 ng/mL
Prostate Specific Ag, Serum: 1.5 ng/mL (ref 0.0–4.0)

## 2022-05-26 LAB — LIPID PANEL
Chol/HDL Ratio: 2.1 ratio (ref 0.0–5.0)
Cholesterol, Total: 159 mg/dL (ref 100–199)
HDL: 76 mg/dL (ref >39)
LDL Chol Calc (NIH): 73 mg/dL (ref 0–99)
Triglycerides: 49 mg/dL (ref 0–149)
VLDL Cholesterol Cal: 10 mg/dL (ref 5–40)

## 2022-05-30 ENCOUNTER — Encounter: Payer: Commercial Managed Care - PPO | Admitting: Internal Medicine

## 2022-06-12 ENCOUNTER — Ambulatory Visit (INDEPENDENT_AMBULATORY_CARE_PROVIDER_SITE_OTHER): Payer: Commercial Managed Care - PPO

## 2022-06-12 DIAGNOSIS — I5022 Chronic systolic (congestive) heart failure: Secondary | ICD-10-CM

## 2022-06-12 DIAGNOSIS — Z95810 Presence of automatic (implantable) cardiac defibrillator: Secondary | ICD-10-CM

## 2022-06-14 NOTE — Progress Notes (Signed)
EPIC Encounter for ICM Monitoring  Patient Name: George Barber is a 61 y.o. male Date: 06/14/2022 Primary Care Physican: Bennie Pierini, FNP Primary Cardiologist: Ladona Ridgel Electrophysiologist: Ladona Ridgel 6/7/2023Weight: 146 lbs 06/14/2022 Weight: 146 lbs   Spoke with patient and heart failure questions reviewed.  Transmission results reviewed.  Pt asymptomatic for fluid accumulation.  Reports feeling well at this time and voices no complaints.           CorVue thoracic impedance suggesting normal fluid levels.   Prescribed:  Furosemide 20 mg 1 tablet daily Spironolactone 25 mg take 1 tablet daily   Labs: 10/31/2021 Creatinine 0.87, BUN 14, Potassium 4.7, Sodium 138, GFR 99 A complete set of results can be found in Results Review.   Recommendations:  No changes and encouraged to call if experiencing any fluid symptoms.   Follow-up plan: ICM clinic phone appointment on 07/31/2022.  91 day device clinic remote transmission 07/05/2022.   EP/Cardiology Office Visits:   06/27/2022 with Dr. Ladona Ridgel.     Copy of ICM check sent to Dr. Ladona Ridgel.   3 month ICM trend: 06/12/2022.    12-14 Month ICM trend:     Karie Soda, RN 06/14/2022 9:43 AM

## 2022-06-26 ENCOUNTER — Encounter: Payer: Commercial Managed Care - PPO | Admitting: Internal Medicine

## 2022-06-27 ENCOUNTER — Ambulatory Visit: Payer: Commercial Managed Care - PPO | Attending: Internal Medicine | Admitting: Internal Medicine

## 2022-06-27 ENCOUNTER — Encounter: Payer: Self-pay | Admitting: Internal Medicine

## 2022-06-27 VITALS — BP 140/82 | HR 69 | Ht 68.0 in | Wt 142.0 lb

## 2022-06-27 DIAGNOSIS — I5022 Chronic systolic (congestive) heart failure: Secondary | ICD-10-CM

## 2022-06-27 DIAGNOSIS — Z95810 Presence of automatic (implantable) cardiac defibrillator: Secondary | ICD-10-CM

## 2022-06-27 DIAGNOSIS — I10 Essential (primary) hypertension: Secondary | ICD-10-CM | POA: Diagnosis not present

## 2022-06-27 NOTE — Progress Notes (Signed)
HPI George Barber returns today for followup. He is a very pleasant 61 year old man with a history of ventricular tachycardia, status post ICD insertion. He has a nonischemic cardiomyopathy with chronic systolic heart failure currently class II. In the interim, he has been stable. He denies chest pain or syncope. No peripheral edema. No ICD shocks. He is still working and also helps take care of his grandchildren.   No Known Allergies   Current Outpatient Medications  Medication Sig Dispense Refill   albuterol (VENTOLIN HFA) 108 (90 Base) MCG/ACT inhaler INHALE 2 PUFFS BY MOUTH EVERY 6 HOURS AS NEEDED FOR WHEEZING AND FOR SHORTNESS OF BREATH . 18 g 0   amiodarone (PACERONE) 200 MG tablet Take 1 tablet (200 mg total) by mouth daily. Only Monday through Friday NO SAT/SUN 90 tablet 1   aspirin 81 MG tablet Take 81 mg by mouth daily.       carvedilol (COREG) 12.5 MG tablet Take 1 tablet (12.5 mg total) by mouth 2 (two) times daily with a meal. TAKE 1 TABLET BY MOUTH TWICE DAILY WITH MEALS . APPOINTMENT REQUIRED FOR FUTURE REFILLS 180 tablet 1   furosemide (LASIX) 20 MG tablet TAKE 1 TABLET BY MOUTH ONCE DAILY . APPOINTMENT REQUIRED FOR FUTURE REFILLS 90 tablet 1   losartan (COZAAR) 100 MG tablet Take 0.5 tablets (50 mg total) by mouth daily. 45 tablet 1   spironolactone (ALDACTONE) 25 MG tablet TAKE 1 TABLET BY MOUTH ONCE DAILY . APPOINTMENT REQUIRED FOR FUTURE REFILLS 90 tablet 1   umeclidinium-vilanterol (ANORO ELLIPTA) 62.5-25 MCG/ACT AEPB Inhale 1 puff into the lungs daily. 60 each 3   No current facility-administered medications for this visit.     Past Medical History:  Diagnosis Date   Elevated LFTs    Emphysema    Fluttering heart    NICM (nonischemic cardiomyopathy) (HCC)    cath 12/13/10: Normal cors, EF 10-15%;  b. echo 5/12 EF 15%, mild MR, mod LAE, mild RVE, mild to mod RAE, mild to mod TR, PASP 44   NSVT (nonsustained ventricular tachycardia) (HCC)    Life Vest; amiodarone  rx   Systolic CHF, chronic (HCC)     ROS:   All systems reviewed and negative except as noted in the HPI.   Past Surgical History:  Procedure Laterality Date   2D ECHOCARDIOGRAM  02/2011   CARDIAC CATHETERIZATION     ICD GENERATOR CHANGEOUT N/A 03/23/2022   Procedure: ICD GENERATOR CHANGEOUT;  Surgeon: Marinus Maw, MD;  Location: [facility] INVASIVE [facility];  Service: Cardiovascular;  Laterality: N/A;     Family History  Problem Relation Age of Onset   Cancer Mother 78   Coronary artery disease Father        unknown   Emphysema Father      Social History   Socioeconomic History   Marital status: Married    Spouse name: Not on file   Number of children: 1   Years of education: Not on file   Highest education level: Not on file  Occupational History   Occupation: ELECTRICAL    Employer: [employer]  Tobacco Use   Smoking status: Former    Packs/day: 1.00    Years: 15.00    Total pack years: 15.00    Types: Cigarettes    Quit date: 10/23/2010    Years since quitting: 11.6   Smokeless tobacco: Never  Substance and Sexual Activity   Alcohol use: No   Drug use: No  Sexual activity: Not on file  Other Topics Concern   Not on file  Social History Narrative    Mr. Tozzi is married and lives with his wife.  They have     1 child together.  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  He quit alcohol 1 month ago, after a 20-year history     of what he describes as social alcohol, but does endorse drinking 6-8     beers per day.         Social Determinants of Health   Financial Resource Strain: Not on file  Food Insecurity: Not on file  Transportation Needs: Not on file  Physical Activity: Not on file  Stress: Not on file  Social Connections: Not on file  Intimate Partner Violence: Not on file     BP (!) 140/82   Pulse 69   Ht 5\' 8"  (1.727 m)   Wt 142 lb (64.4 kg)   SpO2 96%   BMI 21.59 kg/m   Physical Exam:  Well  appearing NAD HEENT: Unremarkable Neck:  No JVD, no thyromegally Lymphatics:  No adenopathy Back:  No CVA tenderness Lungs:  Clear HEART:  Regular rate rhythm, no murmurs, no rubs, no clicks Abd:  soft, positive bowel sounds, no organomegally, no rebound, no guarding Ext:  2 plus pulses, no edema, no cyanosis, no clubbing Skin:  No rashes no nodules Neuro:  CN II through XII intact, motor grossly intact  EKG  DEVICE  Normal device function.  See PaceArt for details.   Assess/Plan:   VT - he has done well with no recurrent episodes. He has been instructed to reduce his dose of amiodarone to 200 mg daily, none on Sunday. I would recommend further reducing to 5 days a week in a year if his VT remains quiet. 2. Chronic systolic heart failure -his symptoms remain class 2. He will continue his current meds. He is encouraged to reduce his salt intake. 3. HTN - his SBP is up but he notes a lot of stress driving to our office today from [place]. 4. ICD - His St. Jude single chamber ICD is working normally s/p ICD gen change out.   Media Rashaad Hallstrom,MD

## 2022-06-27 NOTE — Patient Instructions (Signed)
Medication Instructions:  Your physician recommends that you continue on your current medications as directed. Please refer to the Current Medication list given to you today.  *If you need a refill on your cardiac medications before your next appointment, please call your pharmacy*  Lab Work: None ordered.  If you have labs (blood work) drawn today and your tests are completely normal, you will receive your results only by: MyChart Message (if you have MyChart) OR A paper copy in the mail If you have any lab test that is abnormal or we need to change your treatment, we will call you to review the results.  Testing/Procedures: None ordered.  Follow-Up: At [facility], you and your health needs are our priority.  As part of our continuing mission to provide you with exceptional heart care, we have created designated Provider Care Teams.  These Care Teams include your primary Cardiologist (physician) and Advanced Practice Providers (APPs -  Physician Assistants and Nurse Practitioners) who all work together to provide you with the care you need, when you need it.  We recommend signing up for the patient portal called "MyChart".  Sign up information is provided on this After Visit Summary.  MyChart is used to connect with patients for Virtual Visits (Telemedicine).  Patients are able to view lab/test results, encounter notes, upcoming appointments, etc.  Non-urgent messages can be sent to your provider as well.   To learn more about what you can do with MyChart, go to [email or web address].    Your next appointment:   1 year(s)  The format for your next appointment:   In Person  Provider:   Lewayne Bunting, MD{or one of the following Advanced Practice Providers on your designated Care Team:   Francis Dowse, [place] Casimiro Needle "Mardelle Matte" Lanna Poche, [place]  Remote monitoring is used to monitor you ICD from home. This monitoring reduces the number of office visits required to check your device to one  time per year. It allows [place] to keep an eye on the functioning of your device to ensure it is working properly. You are scheduled for a device check from home on 07/05/22. You may send your transmission at any time that day. If you have a wireless device, the transmission will be sent automatically. After your physician reviews your transmission, you will receive a postcard with your next transmission date.  Important Information About Sugar

## 2022-07-05 ENCOUNTER — Ambulatory Visit (INDEPENDENT_AMBULATORY_CARE_PROVIDER_SITE_OTHER): Payer: Commercial Managed Care - PPO

## 2022-07-05 DIAGNOSIS — I429 Cardiomyopathy, unspecified: Secondary | ICD-10-CM | POA: Diagnosis not present

## 2022-07-05 LAB — CUP PACEART REMOTE DEVICE CHECK
Battery Remaining Longevity: 87 mo
Battery Remaining Percentage: 93 %
Battery Voltage: 3.19 V
Brady Statistic RV Percent Paced: 1 %
Date Time Interrogation Session: 20231213020016
HighPow Impedance: 81 Ohm
HighPow Impedance: 81 Ohm
Implantable Lead Connection Status: 753985
Implantable Lead Implant Date: 20120813
Implantable Lead Location: 753860
Implantable Lead Model: 180
Implantable Lead Serial Number: 303783
Implantable Pulse Generator Implant Date: 20230831
Lead Channel Impedance Value: 310 Ohm
Lead Channel Pacing Threshold Amplitude: 1.5 V
Lead Channel Pacing Threshold Pulse Width: 0.8 ms
Lead Channel Sensing Intrinsic Amplitude: 11.7 mV
Lead Channel Setting Pacing Amplitude: 3 V
Lead Channel Setting Pacing Pulse Width: 0.8 ms
Lead Channel Setting Sensing Sensitivity: 0.5 mV
Pulse Gen Serial Number: 8943728

## 2022-07-31 ENCOUNTER — Ambulatory Visit (INDEPENDENT_AMBULATORY_CARE_PROVIDER_SITE_OTHER): Payer: Commercial Managed Care - PPO

## 2022-07-31 DIAGNOSIS — I5022 Chronic systolic (congestive) heart failure: Secondary | ICD-10-CM

## 2022-07-31 DIAGNOSIS — Z95810 Presence of automatic (implantable) cardiac defibrillator: Secondary | ICD-10-CM | POA: Diagnosis not present

## 2022-08-01 NOTE — Progress Notes (Signed)
Remote ICD transmission.   

## 2022-08-02 NOTE — Progress Notes (Signed)
EPIC Encounter for ICM Monitoring  Patient Name: George Barber is a 62 y.o. male Date: 08/02/2022 Primary Care Physican: Chevis Pretty, [place] Primary Cardiologist: Lovena Le Electrophysiologist: Lovena Le 6/7/2023Weight: 146 lbs 06/14/2022 Weight: 146 lbs   Spoke with patient and heart failure questions reviewed.  Transmission results reviewed.  Pt has had flu with diarrhea for the past week which correlates with impedance above baseline.             CorVue thoracic impedance suggesting possible dryness within last week but returned close to normal on 1/8.   Prescribed:  Furosemide 20 mg 1 tablet daily Spironolactone 25 mg take 1 tablet daily   Labs: 05/25/2022 Creatinine 1.01, BUN 14, Potassium 4.9, Sodium 137, GFR 85 03/23/2022 Creatinine 1.14, BUN 10, Potassium 4.0, Sodium 138  01/30/2022 Creatinine 1.09, BUN 13, Potassium 4.5, Sodium 138, GFR 78  10/31/2021 Creatinine 0.87, BUN 14, Potassium 4.7, Sodium 138, GFR 99 A complete set of results can be found in Results Review.   Recommendations:  He is drinking more fluids to stay hydrated.  No changes and encouraged to call if experiencing any fluid symptoms.   Follow-up plan: ICM clinic phone appointment on 09/04/2022.  91 day device clinic remote transmission 10/04/2022.   EP/Cardiology Office Visits:   Next yearly EP appointment due 06/2023.  Last EP visit was 06/27/2022 with Dr. Lovena Le.     Copy of ICM check sent to Dr. Lovena Le.   3 month ICM trend: 07/31/2022.    12-14 Month ICM trend:     Rosalene Billings, RN 08/02/2022 12:33 PM

## 2022-08-07 ENCOUNTER — Ambulatory Visit (INDEPENDENT_AMBULATORY_CARE_PROVIDER_SITE_OTHER): Payer: Commercial Managed Care - PPO

## 2022-08-07 ENCOUNTER — Telehealth: Payer: Self-pay

## 2022-08-07 DIAGNOSIS — Z95810 Presence of automatic (implantable) cardiac defibrillator: Secondary | ICD-10-CM

## 2022-08-07 DIAGNOSIS — I5022 Chronic systolic (congestive) heart failure: Secondary | ICD-10-CM

## 2022-08-07 NOTE — Telephone Encounter (Signed)
Remote ICM transmission received.  Attempted call to patient regarding ICM remote transmission and left message to return call. ? ?

## 2022-08-07 NOTE — Progress Notes (Signed)
EPIC Encounter for ICM Monitoring  Patient Name: George Barber is a 62 y.o. male Date: 08/07/2022 Primary Care Physican: Chevis Pretty, [place] Primary Cardiologist: Lovena Le Electrophysiologist: Lovena Le 6/7/2023Weight: 146 lbs 06/14/2022 Weight: 146 lbs   Attempted call to patient and unable to reach.  Left message to return call. Transmission reviewed.          CorVue thoracic impedance changed from possible dryness to suggesting possible fluid accumulation starting 08/01/2022.   Prescribed:  Furosemide 20 mg 1 tablet daily Spironolactone 25 mg take 1 tablet daily   Labs: 05/25/2022 Creatinine 1.01, BUN 14, Potassium 4.9, Sodium 137, GFR 85 03/23/2022 Creatinine 1.14, BUN 10, Potassium 4.0, Sodium 138  01/30/2022 Creatinine 1.09, BUN 13, Potassium 4.5, Sodium 138, GFR 78  10/31/2021 Creatinine 0.87, BUN 14, Potassium 4.7, Sodium 138, GFR 99 A complete set of results can be found in Results Review.   Recommendations:  Unable to reach.     Follow-up plan: ICM clinic phone appointment on 08/14/2022 to recheck fluid levels.  91 day device clinic remote transmission 10/04/2022.   EP/Cardiology Office Visits:   Next yearly EP appointment due 06/2023.  Last EP visit was 06/27/2022 with Dr. Lovena Le.     Copy of ICM check sent to Dr. Lovena Le.   3 month ICM trend: 08/07/2022.    12-14 Month ICM trend:     Rosalene Billings, RN 08/07/2022 7:11 AM

## 2022-08-14 ENCOUNTER — Ambulatory Visit (INDEPENDENT_AMBULATORY_CARE_PROVIDER_SITE_OTHER): Payer: Commercial Managed Care - PPO

## 2022-08-14 DIAGNOSIS — I5022 Chronic systolic (congestive) heart failure: Secondary | ICD-10-CM

## 2022-08-14 DIAGNOSIS — Z95810 Presence of automatic (implantable) cardiac defibrillator: Secondary | ICD-10-CM

## 2022-08-14 NOTE — Progress Notes (Signed)
EPIC Encounter for ICM Monitoring  Patient Name: George Barber is a 62 y.o. male Date: 08/14/2022 Primary Care Physican: Chevis Pretty, [place] Primary Cardiologist: Lovena Le Electrophysiologist: Lovena Le 6/7/2023Weight: 146 lbs 06/14/2022 Weight: 146 lbs   Transmission reviewed.          CorVue thoracic impedance suggesting fluid levels returned to normal.   Prescribed:  Furosemide 20 mg 1 tablet daily Spironolactone 25 mg take 1 tablet daily   Labs: 05/25/2022 Creatinine 1.01, BUN 14, Potassium 4.9, Sodium 137, GFR 85 03/23/2022 Creatinine 1.14, BUN 10, Potassium 4.0, Sodium 138  01/30/2022 Creatinine 1.09, BUN 13, Potassium 4.5, Sodium 138, GFR 78  10/31/2021 Creatinine 0.87, BUN 14, Potassium 4.7, Sodium 138, GFR 99 A complete set of results can be found in Results Review.   Recommendations:  No changes   Follow-up plan: ICM clinic phone appointment on 09/12/2022.  91 day device clinic remote transmission 10/04/2022.   EP/Cardiology Office Visits:   Next yearly EP appointment due 06/2023.  Last EP visit was 06/27/2022 with Dr. Lovena Le.     Copy of ICM check sent to Dr. Lovena Le.   3 month ICM trend: 08/14/2022.    12-14 Month ICM trend:     Rosalene Billings, RN 08/14/2022 4:02 PM

## 2022-09-04 ENCOUNTER — Ambulatory Visit: Payer: Commercial Managed Care - PPO

## 2022-09-04 DIAGNOSIS — Z95810 Presence of automatic (implantable) cardiac defibrillator: Secondary | ICD-10-CM

## 2022-09-04 DIAGNOSIS — I5022 Chronic systolic (congestive) heart failure: Secondary | ICD-10-CM

## 2022-09-07 ENCOUNTER — Telehealth: Payer: Self-pay

## 2022-09-07 NOTE — Progress Notes (Signed)
EPIC Encounter for ICM Monitoring  Patient Name: George Barber is a 62 y.o. male Date: 09/07/2022 Primary Care Physican: Chevis Pretty, [place] Primary Cardiologist: Lovena Le Electrophysiologist: Lovena Le 6/7/2023Weight: 146 lbs 06/14/2022 Weight: 146 lbs   Attempted call to patient and unable to reach.  Left detailed message per DPR regarding transmission. Transmission reviewed.          CorVue thoracic impedance suggesting fluid levels returned to normal.   Prescribed:  Furosemide 20 mg 1 tablet daily Spironolactone 25 mg take 1 tablet daily   Labs: 05/25/2022 Creatinine 1.01, BUN 14, Potassium 4.9, Sodium 137, GFR 85 03/23/2022 Creatinine 1.14, BUN 10, Potassium 4.0, Sodium 138  01/30/2022 Creatinine 1.09, BUN 13, Potassium 4.5, Sodium 138, GFR 78  10/31/2021 Creatinine 0.87, BUN 14, Potassium 4.7, Sodium 138, GFR 99 A complete set of results can be found in Results Review.   Recommendations:  Unable to reach.     Follow-up plan: ICM clinic phone appointment on 10/09/2022.  91 day device clinic remote transmission 10/04/2022.   EP/Cardiology Office Visits:   Recall 07/29/2023 with Dr. Lovena Le.     Copy of ICM check sent to Dr. Lovena Le.   3 month ICM trend: 09/02/2022.    Rosalene Billings, RN 09/07/2022 9:31 AM

## 2022-09-07 NOTE — Telephone Encounter (Signed)
Remote ICM transmission received.  Attempted call to patient regarding ICM remote transmission and left message to return call   

## 2022-10-04 ENCOUNTER — Ambulatory Visit (INDEPENDENT_AMBULATORY_CARE_PROVIDER_SITE_OTHER): Payer: Commercial Managed Care - PPO

## 2022-10-04 DIAGNOSIS — I429 Cardiomyopathy, unspecified: Secondary | ICD-10-CM

## 2022-10-05 LAB — CUP PACEART REMOTE DEVICE CHECK
Battery Remaining Longevity: 84 mo
Battery Remaining Percentage: 91 %
Battery Voltage: 3.17 V
Brady Statistic RV Percent Paced: 1 %
Date Time Interrogation Session: 20240313020015
HighPow Impedance: 83 Ohm
HighPow Impedance: 83 Ohm
Implantable Lead Connection Status: 753985
Implantable Lead Implant Date: 20120813
Implantable Lead Location: 753860
Implantable Lead Model: 180
Implantable Lead Serial Number: 303783
Implantable Pulse Generator Implant Date: 20230831
Lead Channel Impedance Value: 290 Ohm
Lead Channel Pacing Threshold Amplitude: 1.5 V
Lead Channel Pacing Threshold Pulse Width: 0.8 ms
Lead Channel Sensing Intrinsic Amplitude: 11.7 mV
Lead Channel Setting Pacing Amplitude: 3 V
Lead Channel Setting Pacing Pulse Width: 0.8 ms
Lead Channel Setting Sensing Sensitivity: 0.5 mV
Pulse Gen Serial Number: 8943728

## 2022-10-09 ENCOUNTER — Ambulatory Visit: Payer: Commercial Managed Care - PPO | Attending: Internal Medicine

## 2022-10-09 DIAGNOSIS — Z95810 Presence of automatic (implantable) cardiac defibrillator: Secondary | ICD-10-CM

## 2022-10-09 DIAGNOSIS — I5022 Chronic systolic (congestive) heart failure: Secondary | ICD-10-CM

## 2022-10-09 NOTE — Progress Notes (Unsigned)
EPIC Encounter for ICM Monitoring  Patient Name: George Barber is a 62 y.o. male Date: 10/09/2022 Primary Care Physican: Chevis Pretty, [place] Primary Cardiologist: Lovena Le Electrophysiologist: Lovena Le 6/7/2023Weight: 146 lbs 06/14/2022 Weight: 146 lbs   Attempted call to patient and unable to reach.  Left message to return call. Transmission reviewed.           CorVue thoracic impedance suggesting possible fluid accumulation starting 3/16 and returning toward baseline.   Prescribed:  Furosemide 20 mg 1 tablet daily Spironolactone 25 mg take 1 tablet daily   Labs: 05/25/2022 Creatinine 1.01, BUN 14, Potassium 4.9, Sodium 137, GFR 85 03/23/2022 Creatinine 1.14, BUN 10, Potassium 4.0, Sodium 138  01/30/2022 Creatinine 1.09, BUN 13, Potassium 4.5, Sodium 138, GFR 78  10/31/2021 Creatinine 0.87, BUN 14, Potassium 4.7, Sodium 138, GFR 99 A complete set of results can be found in Results Review.   Recommendations:  Unable to reach.      Follow-up plan: ICM clinic phone appointment on 11/13/2022.  91 day device clinic remote transmission 01/03/2023.   EP/Cardiology Office Visits:   Recall 07/29/2023 with Dr. Lovena Le.     Copy of ICM check sent to Dr. Lovena Le.   3 month ICM trend: 10/09/2022.    12-14 Month ICM trend:     Rosalene Billings, RN 10/09/2022 11:06 AM

## 2022-10-10 ENCOUNTER — Telehealth: Payer: Self-pay

## 2022-10-10 NOTE — Telephone Encounter (Signed)
Remote ICM transmission received.  Attempted call to patient regarding ICM remote transmission and left detailed message per DPR.  Advised to return call for any fluid symptoms or questions. Next ICM remote transmission scheduled 11/13/2022.

## 2022-11-08 NOTE — Progress Notes (Signed)
Remote ICD transmission.   

## 2022-11-13 ENCOUNTER — Ambulatory Visit: Payer: Commercial Managed Care - PPO | Attending: Internal Medicine

## 2022-11-13 DIAGNOSIS — I5022 Chronic systolic (congestive) heart failure: Secondary | ICD-10-CM | POA: Diagnosis not present

## 2022-11-13 DIAGNOSIS — Z95810 Presence of automatic (implantable) cardiac defibrillator: Secondary | ICD-10-CM

## 2022-11-15 NOTE — Progress Notes (Signed)
EPIC Encounter for ICM Monitoring  Patient Name: George Barber is a 62 y.o. male Date: 11/15/2022 Primary Care Physican: Bennie Pierini, FNP Primary Cardiologist: Ladona Ridgel Electrophysiologist: Ladona Ridgel 6/7/2023Weight: 146 lbs 06/14/2022 Weight: 146 lbs   Attempted call to patient and unable to reach.  Left message to return call. Transmission reviewed.           CorVue thoracic impedance suggesting intermittent days with possible fluid accumulation within the last month.   Prescribed:  Furosemide 20 mg 1 tablet daily Spironolactone 25 mg take 1 tablet daily   Labs: 05/25/2022 Creatinine 1.01, BUN 14, Potassium 4.9, Sodium 137, GFR 85 03/23/2022 Creatinine 1.14, BUN 10, Potassium 4.0, Sodium 138  01/30/2022 Creatinine 1.09, BUN 13, Potassium 4.5, Sodium 138, GFR 78  10/31/2021 Creatinine 0.87, BUN 14, Potassium 4.7, Sodium 138, GFR 99 A complete set of results can be found in Results Review.   Recommendations:  Unable to reach.      Follow-up plan: ICM clinic phone appointment on 12/19/2022.  91 day device clinic remote transmission 01/03/2023.   EP/Cardiology Office Visits:   Recall 07/29/2023 with Dr. Ladona Ridgel.     Copy of ICM check sent to Dr. Ladona Ridgel.   3 month ICM trend: 11/13/2022.    12-14 Month ICM trend:     Karie Soda, RN 11/15/2022 10:49 AM

## 2022-11-23 ENCOUNTER — Encounter: Payer: Self-pay | Admitting: Nurse Practitioner

## 2022-11-23 ENCOUNTER — Ambulatory Visit (INDEPENDENT_AMBULATORY_CARE_PROVIDER_SITE_OTHER): Payer: Commercial Managed Care - PPO | Admitting: Nurse Practitioner

## 2022-11-23 ENCOUNTER — Ambulatory Visit (INDEPENDENT_AMBULATORY_CARE_PROVIDER_SITE_OTHER): Payer: Commercial Managed Care - PPO

## 2022-11-23 VITALS — BP 141/78 | HR 64 | Temp 97.4°F | Resp 20 | Ht 68.0 in | Wt 140.0 lb

## 2022-11-23 DIAGNOSIS — I5022 Chronic systolic (congestive) heart failure: Secondary | ICD-10-CM

## 2022-11-23 DIAGNOSIS — I429 Cardiomyopathy, unspecified: Secondary | ICD-10-CM | POA: Diagnosis not present

## 2022-11-23 DIAGNOSIS — J411 Mucopurulent chronic bronchitis: Secondary | ICD-10-CM

## 2022-11-23 DIAGNOSIS — Z95810 Presence of automatic (implantable) cardiac defibrillator: Secondary | ICD-10-CM | POA: Diagnosis not present

## 2022-11-23 DIAGNOSIS — I10 Essential (primary) hypertension: Secondary | ICD-10-CM

## 2022-11-23 DIAGNOSIS — R7989 Other specified abnormal findings of blood chemistry: Secondary | ICD-10-CM

## 2022-11-23 MED ORDER — FUROSEMIDE 20 MG PO TABS: ORAL_TABLET | ORAL | 1 refills | Status: DC

## 2022-11-23 MED ORDER — LOSARTAN POTASSIUM 100 MG PO TABS
50.0000 mg | ORAL_TABLET | Freq: Every day | ORAL | 1 refills | Status: DC
Start: 2022-11-23 — End: 2023-05-25

## 2022-11-23 MED ORDER — CARVEDILOL 12.5 MG PO TABS: 12.5000 mg | ORAL_TABLET | Freq: Two times a day (BID) | ORAL | 1 refills | Status: DC

## 2022-11-23 MED ORDER — SPIRONOLACTONE 25 MG PO TABS: ORAL_TABLET | ORAL | 1 refills | Status: DC

## 2022-11-23 MED ORDER — ANORO ELLIPTA 62.5-25 MCG/ACT IN AEPB: 1.0000 | INHALATION_SPRAY | Freq: Every day | RESPIRATORY_TRACT | 3 refills | Status: DC

## 2022-11-23 NOTE — Patient Instructions (Signed)
Cooking With Less Salt Cooking with less salt is one way to reduce the amount of sodium you get from food. Sodium is one of the elements that make up salt. It is found naturally in foods and is also added to certain foods. Depending on your condition and overall health, your health care provider or dietitian may recommend that you reduce your sodium intake. Most people should have less than 2,300 milligrams (mg) of sodium each day. If you have high blood pressure (hypertension), you may need to limit your sodium to 1,500 mg each day. Follow the tips below to help reduce your sodium intake. What are tips for eating less sodium? Reading food labels  Check the food label before buying or using packaged ingredients. Always check the label for the serving size and sodium content. Look for products with no more than 140 mg of sodium in one serving. Check the % Daily Value column to see what percent of the daily recommended amount of sodium is provided in one serving of the product. Foods with 5% or less in this column are considered low in sodium. Foods with 20% or higher are considered high in sodium. Do not choose foods with salt as one of the first three ingredients on the ingredients list. If salt is one of the first three ingredients, it usually means the item is high in sodium. Shopping Buy sodium-free or low-sodium products. Look for the following words on food labels: Low-sodium. Sodium-free. Reduced-sodium. No salt added. Unsalted. Always check the sodium content even if foods are labeled as low-sodium or no salt added. Buy fresh foods. Cooking Use herbs, seasonings without salt, and spices as substitutes for salt. Use sodium-free baking soda when baking. Grill, braise, or roast foods to add flavor with less salt. Avoid adding salt to pasta, rice, or hot cereals. Drain and rinse canned vegetables, beans, and meat before use. Avoid adding salt when cooking sweets and desserts. Cook with  low-sodium ingredients. What foods are high in sodium? Vegetables Regular canned vegetables (not low-sodium or reduced-sodium). Sauerkraut, pickled vegetables, and relishes. Olives. French fries. Onion rings. Regular canned tomato sauce and paste. Regular tomato and vegetable juice. Frozen vegetables in sauces. Grains Instant hot cereals. Bread stuffing, pancake, and biscuit mixes. Croutons. Seasoned rice or pasta mixes. Noodle soup cups. Boxed or frozen macaroni and cheese. Regular salted crackers. Self-rising flour. Rolls. Bagels. Flour tortillas and wraps. Meats and other proteins Meat or fish that is salted, canned, smoked, cured, spiced, or pickled. This includes bacon, ham, sausages, hot dogs, corned beef, chipped beef, meat loaves, salt pork, jerky, pickled herring, anchovies, regular canned tuna, and sardines. Salted nuts. Dairy Processed cheese and cheese spreads. Cheese curds. Blue cheese. Feta cheese. String cheese. Regular cottage cheese. Buttermilk. Canned milk. The items listed above may not be a complete list of foods high in sodium. Actual amounts of sodium may be different depending on processing. Contact a dietitian for more information. What foods are low in sodium? Fruits Fresh, frozen, or canned fruit with no sauce added. Fruit juice. Vegetables Fresh or frozen vegetables with no sauce added. "No salt added" canned vegetables. "No salt added" tomato sauce and paste. Low-sodium or reduced-sodium tomato and vegetable juice. Grains Noodles, pasta, quinoa, rice. Shredded or puffed wheat or puffed rice. Regular or quick oats (not instant). Low-sodium crackers. Low-sodium bread. Whole-grain bread and whole-grain pasta. Unsalted popcorn. Meats and other proteins Fresh or frozen whole meats, poultry (not injected with sodium), and fish with no sauce added.   Unsalted nuts. Dried peas, beans, and lentils without added salt. Unsalted canned beans. Eggs. Unsalted nut butters. Low-sodium  canned tuna or chicken. Dairy Milk. Soy milk. Yogurt. Low-sodium cheeses, such as Swiss, Monterey Jack, mozzarella, and ricotta. Sherbet or ice cream (keep to  cup per serving). Cream cheese. Fats and oils Unsalted butter or margarine. Other foods Homemade pudding. Sodium-free baking soda and baking powder. Herbs and spices. Low-sodium seasoning mixes. Beverages Coffee and tea. Carbonated beverages. The items listed above may not be a complete list of foods low in sodium. Actual amounts of sodium may be different depending on processing. Contact a dietitian for more information. What are some salt alternatives when cooking? The following are herbs, seasonings, and spices that can be used instead of salt to flavor your food. Herbs should be fresh or dried. Do not choose packaged mixes. Next to the name of the herb, spice, or seasoning are some examples of foods you can pair it with. Herbs Bay leaves - Soups, meat and vegetable dishes, and spaghetti sauce. Basil - Italian dishes, soups, pasta, and fish dishes. Cilantro - Meat, poultry, and vegetable dishes. Chili powder - Marinades and Mexican dishes. Chives - Salad dressings and potato dishes. Cumin - Mexican dishes, couscous, and meat dishes. Dill - Fish dishes, sauces, and salads. Fennel - Meat and vegetable dishes, breads, and cookies. Garlic (do not use garlic salt) - Italian dishes, meat dishes, salad dressings, and sauces. Marjoram - Soups, potato dishes, and meat dishes. Oregano - Pizza and spaghetti sauce. Parsley - Salads, soups, pasta, and meat dishes. Rosemary - Italian dishes, salad dressings, soups, and red meats. 

## 2022-11-23 NOTE — Progress Notes (Signed)
Subjective:    Patient ID: [name], male    DOB: July 28, 1960, 62 y.o.   MRN: [id number]   Chief Complaint: medical management of chronic issues     HPI:  George Barber is a 62 y.o. who identifies as a male who was assigned male at birth.   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 Primary hypertension No c/o chest pain, SOB or headache. Does not check blood pressure at home. BP Readings from Last 3 Encounters:  06/27/22 (!) 140/82  05/25/22 (!) 149/86  03/23/22 129/83     2. Cardiomyopathy, secondary (HCC) 3. Chronic systolic heart failure (HCC) 4. ICD (implantable cardioverter-defibrillator) in place Last saw cardiology on 06/27/22. They reduce his dose of amiodarone. He has had any Has had cardioverter checked several times.  5. Mucopurulent chronic bronchitis (HCC) No c/o of cough or SOB.  6. Elevated LFTs Lab Results  Component Value Date   ALT 18 05/25/2022   AST 25 05/25/2022   ALKPHOS 78 05/25/2022   BILITOT 0.4 05/25/2022      New complaints: None today  No Known Allergies Outpatient Encounter Medications as of 11/23/2022  Medication Sig   albuterol (VENTOLIN HFA) 108 (90 Base) MCG/ACT inhaler INHALE 2 PUFFS BY MOUTH EVERY 6 HOURS AS NEEDED FOR WHEEZING AND FOR SHORTNESS OF BREATH .   amiodarone (PACERONE) 200 MG tablet Take 1 tablet (200 mg total) by mouth daily. Only Monday through Friday NO SAT/SUN   aspirin 81 MG tablet Take 81 mg by mouth daily.     carvedilol (COREG) 12.5 MG tablet Take 1 tablet (12.5 mg total) by mouth 2 (two) times daily with a meal. TAKE 1 TABLET BY MOUTH TWICE DAILY WITH MEALS . APPOINTMENT REQUIRED FOR FUTURE REFILLS   furosemide (LASIX) 20 MG tablet TAKE 1 TABLET BY MOUTH ONCE DAILY . APPOINTMENT REQUIRED FOR FUTURE REFILLS   losartan (COZAAR) 100 MG tablet Take 0.5 tablets (50 mg total) by mouth daily.    spironolactone (ALDACTONE) 25 MG tablet TAKE 1 TABLET BY MOUTH ONCE DAILY . APPOINTMENT REQUIRED FOR FUTURE REFILLS   umeclidinium-vilanterol (ANORO ELLIPTA) 62.5-25 MCG/ACT AEPB Inhale 1 puff into the lungs daily.   No facility-administered encounter medications on file as of 11/23/2022.    Past Surgical History:  Procedure Laterality Date   2D ECHOCARDIOGRAM  02/2011   CARDIAC CATHETERIZATION     ICD GENERATOR CHANGEOUT N/A 03/23/2022   Procedure: ICD GENERATOR CHANGEOUT;  Surgeon: Marinus Maw, MD;  Location: [facility] INVASIVE CV LAB;  Service: Cardiovascular;  Laterality: N/A;    Family History  Problem Relation Age of Onset   Cancer Mother 45   Coronary artery disease Father        unknown   Emphysema Father       Controlled substance contract: n/a     Review of Systems  Constitutional:  Negative for diaphoresis.  Eyes:  Negative for pain.  Respiratory:  Negative for shortness of breath.   Cardiovascular:  Negative for chest pain, palpitations and leg swelling.  Gastrointestinal:  Negative for abdominal pain.  Endocrine: Negative for polydipsia.  Skin:  Negative for rash.  Neurological:  Negative for dizziness, weakness and headaches.  Hematological:  Does not bruise/bleed easily.  All other systems reviewed and are negative.      Objective:   Physical Exam Vitals and nursing note reviewed.  Constitutional:  Appearance: Normal appearance. He is well-developed.  HENT:     Head: Normocephalic.     Nose: Nose normal.     Mouth/Throat:     Mouth: Mucous membranes are moist.     Pharynx: Oropharynx is clear.  Eyes:     Pupils: Pupils are equal, round, and reactive to light.  Neck:     Thyroid: No thyroid mass or thyromegaly.     Vascular: No carotid bruit or JVD.     Trachea: Phonation normal.  Cardiovascular:     Rate and Rhythm: Normal rate and regular rhythm.  Pulmonary:     Effort: Pulmonary effort is normal. No respiratory distress.     Breath sounds:  Normal breath sounds.  Abdominal:     General: Bowel sounds are normal.     Palpations: Abdomen is soft.     Tenderness: There is no abdominal tenderness.  Musculoskeletal:        General: Normal range of motion.     Cervical back: Normal range of motion and neck supple.  Lymphadenopathy:     Cervical: No cervical adenopathy.  Skin:    General: Skin is warm and dry.  Neurological:     Mental Status: He is alert and oriented to person, place, and time.  Psychiatric:        Behavior: Behavior normal.        Thought Content: Thought content normal.        Judgment: Judgment normal.    BP (!) 141/78   Pulse 64   Temp (!) 97.4 F (36.3 C) (Temporal)   Resp 20   Ht 5\' 8"  (1.727 m)   Wt 140 lb (63.5 kg)   SpO2 98%   BMI 21.29 kg/m         Assessment & Plan:   George Barber comes in today with chief complaint of Medical Management of Chronic Issues   Diagnosis and orders addressed:  1. Primary hypertension Low sodium diet - losartan (COZAAR) 100 MG tablet; Take 0.5 tablets (50 mg total) by mouth daily.  Dispense: 45 tablet; Refill: 1 - CBC with Differential/Platelet - CMP14+EGFR - Lipid panel  2. Cardiomyopathy, secondary (HCC) Keep follow up with cardiology - carvedilol (COREG) 12.5 MG tablet; Take 1 tablet (12.5 mg total) by mouth 2 (two) times daily with a meal. TAKE 1 TABLET BY MOUTH TWICE DAILY WITH MEALS . APPOINTMENT REQUIRED FOR FUTURE REFILLS  Dispense: 180 tablet; Refill: 1  3. Chronic systolic heart failure (HCC) Keep follow up with cardiology - furosemide (LASIX) 20 MG tablet; TAKE 1 TABLET BY MOUTH ONCE DAILY . APPOINTMENT REQUIRED FOR FUTURE REFILLS  Dispense: 90 tablet; Refill: 1 - spironolactone (ALDACTONE) 25 MG tablet; TAKE 1 TABLET BY MOUTH ONCE DAILY . APPOINTMENT REQUIRED FOR FUTURE REFILLS  Dispense: 90 tablet; Refill: 1  4. ICD (implantable cardioverter-defibrillator) in place   5. Mucopurulent chronic bronchitis (HCC) - DG Chest 2 View -  umeclidinium-vilanterol (ANORO ELLIPTA) 62.5-25 MCG/ACT AEPB; Inhale 1 puff into the lungs daily.  Dispense: 60 each; Refill: 3  6. Elevated LFTs Labs pending   Labs pending Health Maintenance reviewed Diet and exercise encouraged  Follow up plan: 6 months   Mary-Margaret Daphine Deutscher, FNP

## 2022-12-07 ENCOUNTER — Telehealth: Payer: Self-pay | Admitting: *Deleted

## 2022-12-07 NOTE — Telephone Encounter (Signed)
Need to know what insurance will cover 

## 2022-12-07 NOTE — Telephone Encounter (Signed)
Fax from [facility] pharmacy RE: Ernestina Patches Note: no longer covered by pt's insurance Please advise on alternative

## 2022-12-07 NOTE — Telephone Encounter (Signed)
Pharmacy ran script, does not tell them what insurance prefers.

## 2022-12-08 MED ORDER — TRELEGY ELLIPTA 100-62.5-25 MCG/ACT IN AEPB: 1.0000 | INHALATION_SPRAY | Freq: Every day | RESPIRATORY_TRACT | 11 refills | Status: DC

## 2022-12-08 NOTE — Telephone Encounter (Signed)
Patient will need to contact insurance to see what is preferred

## 2022-12-08 NOTE — Telephone Encounter (Signed)
What about Trelegy? Can that be sent in?

## 2022-12-08 NOTE — Addendum Note (Signed)
Addended by: Bennie Pierini on: 12/08/2022 04:59 PM   Modules accepted: Orders

## 2022-12-12 ENCOUNTER — Other Ambulatory Visit: Payer: Self-pay | Admitting: Nurse Practitioner

## 2022-12-12 DIAGNOSIS — I429 Cardiomyopathy, unspecified: Secondary | ICD-10-CM

## 2022-12-12 NOTE — Telephone Encounter (Signed)
Last office visit 11/23/22 Last refill 04/06/22 #90, 1 refill by another office

## 2022-12-14 ENCOUNTER — Telehealth: Payer: Self-pay | Admitting: Nurse Practitioner

## 2022-12-14 DIAGNOSIS — J411 Mucopurulent chronic bronchitis: Secondary | ICD-10-CM

## 2022-12-15 NOTE — Telephone Encounter (Signed)
Please verify patient is taking Anoro. He has Trelegy on list also. Should not be taking both. I would recommend trelegy.

## 2022-12-17 ENCOUNTER — Other Ambulatory Visit: Payer: Self-pay | Admitting: Nurse Practitioner

## 2022-12-17 DIAGNOSIS — I429 Cardiomyopathy, unspecified: Secondary | ICD-10-CM

## 2022-12-19 ENCOUNTER — Ambulatory Visit: Payer: Commercial Managed Care - PPO | Attending: Internal Medicine

## 2022-12-19 ENCOUNTER — Other Ambulatory Visit (HOSPITAL_COMMUNITY): Payer: Self-pay

## 2022-12-19 DIAGNOSIS — I5022 Chronic systolic (congestive) heart failure: Secondary | ICD-10-CM

## 2022-12-19 DIAGNOSIS — Z95810 Presence of automatic (implantable) cardiac defibrillator: Secondary | ICD-10-CM | POA: Diagnosis not present

## 2022-12-19 NOTE — Telephone Encounter (Signed)
Pt is taking anoro, he had a prescription filled not long ago

## 2022-12-19 NOTE — Telephone Encounter (Signed)
Ok to continue anoro and coreg

## 2022-12-20 ENCOUNTER — Other Ambulatory Visit: Payer: Self-pay | Admitting: Nurse Practitioner

## 2022-12-20 DIAGNOSIS — I429 Cardiomyopathy, unspecified: Secondary | ICD-10-CM

## 2022-12-20 NOTE — Telephone Encounter (Signed)
Paperwork faxed back

## 2022-12-21 ENCOUNTER — Telehealth: Payer: Self-pay

## 2022-12-21 NOTE — Progress Notes (Signed)
EPIC Encounter for ICM Monitoring  Patient Name: George Barber is a 62 y.o. male Date: 12/21/2022 Primary Care Physican: Bennie Pierini, FNP Primary Cardiologist: Ladona Ridgel Electrophysiologist: Ladona Ridgel 11/23/2022 Office Weight: 140 lbs   Attempted call to patient and unable to reach.  Left message to return call. Transmission reviewed.           CorVue thoracic impedance suggesting intermittent days with possible fluid accumulation within the last month.   Prescribed:  Furosemide 20 mg 1 tablet daily Spironolactone 25 mg take 1 tablet daily   Labs: 05/25/2022 Creatinine 1.01, BUN 14, Potassium 4.9, Sodium 137, GFR 85 03/23/2022 Creatinine 1.14, BUN 10, Potassium 4.0, Sodium 138  01/30/2022 Creatinine 1.09, BUN 13, Potassium 4.5, Sodium 138, GFR 78  10/31/2021 Creatinine 0.87, BUN 14, Potassium 4.7, Sodium 138, GFR 99 A complete set of results can be found in Results Review.   Recommendations:  Unable to reach.      Follow-up plan: ICM clinic phone appointment on 01/22/2023.  91 day device clinic remote transmission 01/03/2023.   EP/Cardiology Office Visits:   Recall 07/29/2023 with Dr. Ladona Ridgel.     Copy of ICM check sent to Dr. Ladona Ridgel.   3 month ICM trend: 12/19/2022.    12-14 Month ICM trend:     Karie Soda, RN 12/21/2022 1:47 PM

## 2022-12-21 NOTE — Telephone Encounter (Signed)
Remote ICM transmission received.  Attempted call to patient regarding ICM remote transmission and left message per DPR with ICM phone number to return call.

## 2022-12-26 MED ORDER — ANORO ELLIPTA 62.5-25 MCG/ACT IN AEPB
1.0000 | INHALATION_SPRAY | Freq: Every day | RESPIRATORY_TRACT | 3 refills | Status: DC
Start: 2022-12-26 — End: 2023-01-04

## 2022-12-26 NOTE — Telephone Encounter (Signed)
Pt called stating that he needs his Anoro Rx and paperwork faxed to [phone] so it will be covered.

## 2022-12-26 NOTE — Telephone Encounter (Signed)
Patient states that Anoro is the only inhaler that helps him and he has been taking for quite some time. He would like to continue taking this. Please review and advise

## 2022-12-26 NOTE — Addendum Note (Signed)
Addended by: Bennie Pierini on: 12/26/2022 05:29 PM   Modules accepted: Orders

## 2022-12-29 ENCOUNTER — Other Ambulatory Visit (HOSPITAL_COMMUNITY): Payer: Self-pay

## 2023-01-02 ENCOUNTER — Telehealth: Payer: Self-pay | Admitting: Nurse Practitioner

## 2023-01-02 NOTE — Telephone Encounter (Signed)
Contacted patient to find out the name of the company he should be getting his medication from. Unable to find the original form that was sent to [place] and faxed back.

## 2023-01-03 ENCOUNTER — Ambulatory Visit (INDEPENDENT_AMBULATORY_CARE_PROVIDER_SITE_OTHER): Payer: Commercial Managed Care - PPO

## 2023-01-03 DIAGNOSIS — I429 Cardiomyopathy, unspecified: Secondary | ICD-10-CM

## 2023-01-03 LAB — CUP PACEART REMOTE DEVICE CHECK
Battery Remaining Longevity: 83 mo
Battery Remaining Percentage: 90 %
Battery Voltage: 3.13 V
Brady Statistic RV Percent Paced: 1 %
Date Time Interrogation Session: 20240612020015
HighPow Impedance: 82 Ohm
HighPow Impedance: 82 Ohm
Implantable Lead Connection Status: 753985
Implantable Lead Implant Date: 20120813
Implantable Lead Location: 753860
Implantable Lead Model: 180
Implantable Lead Serial Number: 303783
Implantable Pulse Generator Implant Date: 20230831
Lead Channel Impedance Value: 300 Ohm
Lead Channel Pacing Threshold Amplitude: 1.5 V
Lead Channel Pacing Threshold Pulse Width: 0.8 ms
Lead Channel Sensing Intrinsic Amplitude: 11.7 mV
Lead Channel Setting Pacing Amplitude: 3 V
Lead Channel Setting Pacing Pulse Width: 0.8 ms
Lead Channel Setting Sensing Sensitivity: 0.5 mV
Pulse Gen Serial Number: 8943728

## 2023-01-04 ENCOUNTER — Other Ambulatory Visit: Payer: Self-pay

## 2023-01-04 DIAGNOSIS — J411 Mucopurulent chronic bronchitis: Secondary | ICD-10-CM

## 2023-01-04 MED ORDER — ANORO ELLIPTA 62.5-25 MCG/ACT IN AEPB
1.0000 | INHALATION_SPRAY | Freq: Every day | RESPIRATORY_TRACT | 3 refills | Status: DC
Start: 2023-01-04 — End: 2023-11-22

## 2023-01-04 NOTE — Telephone Encounter (Signed)
Patient stopped by office with information. The name of the company he is getting his Anoro from is [facility]. Case number is [id number]. Phone number for them is [phone] and fax number is [phone]. Contacted [facility] and they advised that they only needed a rx for the Anoro to get the process started. Rx written and sent to [facility]

## 2023-01-22 ENCOUNTER — Ambulatory Visit: Payer: Commercial Managed Care - PPO | Attending: Internal Medicine

## 2023-01-22 DIAGNOSIS — Z95810 Presence of automatic (implantable) cardiac defibrillator: Secondary | ICD-10-CM | POA: Diagnosis not present

## 2023-01-22 DIAGNOSIS — I5022 Chronic systolic (congestive) heart failure: Secondary | ICD-10-CM

## 2023-01-26 ENCOUNTER — Telehealth: Payer: Self-pay

## 2023-01-26 NOTE — Telephone Encounter (Signed)
Remote ICM transmission received.  Attempted call to patient regarding ICM remote transmission and left message to return call   

## 2023-01-26 NOTE — Progress Notes (Signed)
EPIC Encounter for ICM Monitoring  Patient Name: George Barber is a 62 y.o. male Date: 01/26/2023 Primary Care Physican: Bennie Pierini, FNP Primary Cardiologist: Ladona Ridgel Electrophysiologist: Ladona Ridgel 11/23/2022 Office Weight: 140 lbs   Attempted call to patient and unable to reach.  Left message to return call. Transmission reviewed.           CorVue thoracic impedance suggesting normal fluid levels within the last month.   Prescribed:  Furosemide 20 mg 1 tablet daily Spironolactone 25 mg take 1 tablet daily   Labs: 05/25/2022 Creatinine 1.01, BUN 14, Potassium 4.9, Sodium 137, GFR 85 03/23/2022 Creatinine 1.14, BUN 10, Potassium 4.0, Sodium 138  01/30/2022 Creatinine 1.09, BUN 13, Potassium 4.5, Sodium 138, GFR 78  10/31/2021 Creatinine 0.87, BUN 14, Potassium 4.7, Sodium 138, GFR 99 A complete set of results can be found in Results Review.   Recommendations:  Unable to reach.      Follow-up plan: ICM clinic phone appointment on 02/26/2023.  91 day device clinic remote transmission 04/04/2023.   EP/Cardiology Office Visits:   Recall 07/29/2023 with Dr. Ladona Ridgel.     Copy of ICM check sent to Dr. Ladona Ridgel.    3 month ICM trend: 01/22/2023.    12-14 Month ICM trend:     Karie Soda, RN 01/26/2023 1:44 PM

## 2023-01-29 NOTE — Progress Notes (Signed)
Remote ICD transmission.   

## 2023-02-26 ENCOUNTER — Ambulatory Visit: Payer: Commercial Managed Care - PPO

## 2023-02-26 DIAGNOSIS — Z95810 Presence of automatic (implantable) cardiac defibrillator: Secondary | ICD-10-CM

## 2023-02-26 DIAGNOSIS — I5022 Chronic systolic (congestive) heart failure: Secondary | ICD-10-CM | POA: Diagnosis not present

## 2023-02-28 NOTE — Progress Notes (Signed)
EPIC Encounter for ICM Monitoring  Patient Name: George Barber is a 62 y.o. male Date: 02/28/2023 Primary Care Physican: Bennie Pierini, FNP Primary Cardiologist: Ladona Ridgel Electrophysiologist: Ladona Ridgel 11/23/2022 Office Weight: 140 lbs 02/26/2023 Weight: 140 lbs    Spoke with patient and heart failure questions reviewed.  Transmission results reviewed.  Pt asymptomatic for fluid accumulation.  Reports feeling well at this time and voices no complaints.            CorVue thoracic impedance suggesting normal fluid levels within the last month with the exception of possible fluid accumulation from 7/9-7/15.   Prescribed:  Furosemide 20 mg 1 tablet daily Spironolactone 25 mg take 1 tablet daily   Labs: 05/25/2022 Creatinine 1.01, BUN 14, Potassium 4.9, Sodium 137, GFR 85 03/23/2022 Creatinine 1.14, BUN 10, Potassium 4.0, Sodium 138  01/30/2022 Creatinine 1.09, BUN 13, Potassium 4.5, Sodium 138, GFR 78  10/31/2021 Creatinine 0.87, BUN 14, Potassium 4.7, Sodium 138, GFR 99 A complete set of results can be found in Results Review.   Recommendations:  No changes and encouraged to call if experiencing any fluid symptoms.   Follow-up plan: ICM clinic phone appointment on 04/02/2023.  91 day device clinic remote transmission 04/04/2023.   EP/Cardiology Office Visits:   Recall 07/29/2023 with Dr. Ladona Ridgel.     Copy of ICM check sent to Dr. Ladona Ridgel.    3 month ICM trend: 02/26/2023.    12-14 Month ICM trend:     Karie Soda, RN 02/28/2023 12:23 PM

## 2023-04-02 ENCOUNTER — Ambulatory Visit: Payer: Commercial Managed Care - PPO | Attending: Internal Medicine

## 2023-04-02 DIAGNOSIS — Z95810 Presence of automatic (implantable) cardiac defibrillator: Secondary | ICD-10-CM

## 2023-04-02 DIAGNOSIS — I5022 Chronic systolic (congestive) heart failure: Secondary | ICD-10-CM

## 2023-04-03 ENCOUNTER — Telehealth: Payer: Self-pay

## 2023-04-03 NOTE — Progress Notes (Signed)
Spoke with patient and heart failure questions reviewed.  Transmission results reviewed.  Pt asymptomatic for fluid accumulation.  Reports feeling well at this time and voices no complaints.  Discussed diet and fluid intake.  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  He tries to stay hydrated during the hot summer months.  He drinks a 40 ounce beer daily in addition to other daily fluid intake.  Diet consists of using salt on most of his foods.    Discussed long term effects of high salt diet and alcohol.  Encouraged to decrease salt and fluid intake and will recheck fluid levels next week.

## 2023-04-03 NOTE — Telephone Encounter (Signed)
Remote ICM transmission received.  Attempted call to patient regarding ICM remote transmission and left message to return call   

## 2023-04-03 NOTE — Progress Notes (Signed)
EPIC Encounter for ICM Monitoring  Patient Name: George Barber is a 62 y.o. male Date: 04/03/2023 Primary Care Physican: Bennie Pierini, FNP Primary Cardiologist: Ladona Ridgel Electrophysiologist: Ladona Ridgel 11/23/2022 Office Weight: 140 lbs 02/26/2023 Weight: 140 lbs     Attempted call to patient and unable to reach.  Left message to return call. Transmission reviewed.          CorVue thoracic impedance suggesting possible fluid accumulation starting 9/3 but trending back to baseline    Also suggesting possible fluid accumulation from 8/5-8/16   Prescribed:  Furosemide 20 mg 1 tablet daily Spironolactone 25 mg take 1 tablet daily   Labs: 05/25/2022 Creatinine 1.01, BUN 14, Potassium 4.9, Sodium 137, GFR 85 03/23/2022 Creatinine 1.14, BUN 10, Potassium 4.0, Sodium 138  01/30/2022 Creatinine 1.09, BUN 13, Potassium 4.5, Sodium 138, GFR 78  10/31/2021 Creatinine 0.87, BUN 14, Potassium 4.7, Sodium 138, GFR 99 A complete set of results can be found in Results Review.   Recommendations:  Unable to reach.     Follow-up plan: ICM clinic phone appointment on 04/09/2023 to recheck fluid levels.  91 day device clinic remote transmission 07/04/2023.   EP/Cardiology Office Visits:   Recall 07/29/2023 with Dr. Ladona Ridgel.     Copy of ICM check sent to Dr. Ladona Ridgel.    3 month ICM trend: 04/02/2023.    12-14 Month ICM trend:     Karie Soda, RN 04/03/2023 7:34 AM

## 2023-04-04 ENCOUNTER — Ambulatory Visit (INDEPENDENT_AMBULATORY_CARE_PROVIDER_SITE_OTHER): Payer: Commercial Managed Care - PPO

## 2023-04-04 DIAGNOSIS — I429 Cardiomyopathy, unspecified: Secondary | ICD-10-CM | POA: Diagnosis not present

## 2023-04-04 LAB — CUP PACEART REMOTE DEVICE CHECK
Battery Remaining Longevity: 80 mo
Battery Remaining Percentage: 88 %
Battery Voltage: 3.11 V
Brady Statistic RV Percent Paced: 1 %
Date Time Interrogation Session: 20240909191203
HighPow Impedance: 68 Ohm
HighPow Impedance: 68 Ohm
Implantable Lead Connection Status: 753985
Implantable Lead Implant Date: 20120813
Implantable Lead Location: 753860
Implantable Lead Model: 180
Implantable Lead Serial Number: 303783
Implantable Pulse Generator Implant Date: 20230831
Lead Channel Impedance Value: 280 Ohm
Lead Channel Pacing Threshold Amplitude: 1.5 V
Lead Channel Pacing Threshold Pulse Width: 0.8 ms
Lead Channel Sensing Intrinsic Amplitude: 9 mV
Lead Channel Setting Pacing Amplitude: 3 V
Lead Channel Setting Pacing Pulse Width: 0.8 ms
Lead Channel Setting Sensing Sensitivity: 0.5 mV
Pulse Gen Serial Number: 8943728

## 2023-04-09 ENCOUNTER — Ambulatory Visit: Payer: Commercial Managed Care - PPO | Attending: Internal Medicine

## 2023-04-09 DIAGNOSIS — Z95810 Presence of automatic (implantable) cardiac defibrillator: Secondary | ICD-10-CM

## 2023-04-09 DIAGNOSIS — I5022 Chronic systolic (congestive) heart failure: Secondary | ICD-10-CM

## 2023-04-10 ENCOUNTER — Telehealth: Payer: Self-pay

## 2023-04-10 NOTE — Progress Notes (Signed)
EPIC Encounter for ICM Monitoring  Patient Name: George Barber is a 62 y.o. male Date: 04/10/2023 Primary Care Physican: Bennie Pierini, FNP Primary Cardiologist: Ladona Ridgel Electrophysiologist: Ladona Ridgel 11/23/2022 Office Weight: 140 lbs 02/26/2023 Weight: 140 lbs     Attempted call to patient and unable to reach.  Left message to return call. Transmission reviewed.          CorVue thoracic impedance suggesting fluid levels returned to normal.   Prescribed:  Furosemide 20 mg 1 tablet daily Spironolactone 25 mg take 1 tablet daily   Labs: 05/25/2022 Creatinine 1.01, BUN 14, Potassium 4.9, Sodium 137, GFR 85 03/23/2022 Creatinine 1.14, BUN 10, Potassium 4.0, Sodium 138  01/30/2022 Creatinine 1.09, BUN 13, Potassium 4.5, Sodium 138, GFR 78  10/31/2021 Creatinine 0.87, BUN 14, Potassium 4.7, Sodium 138, GFR 99 A complete set of results can be found in Results Review.   Recommendations:  Unable to reach.     Follow-up plan: ICM clinic phone appointment on 05/07/2023.  91 day device clinic remote transmission 07/04/2023.   EP/Cardiology Office Visits:   Recall 07/29/2023 with Dr. Ladona Ridgel.     Copy of ICM check sent to Dr. Ladona Ridgel.    3 month ICM trend: 04/10/2023.    12-14 Month ICM trend:     Karie Soda, RN 04/10/2023 2:44 PM

## 2023-04-10 NOTE — Telephone Encounter (Signed)
Remote ICM transmission received.  Attempted call to patient regarding ICM remote transmission and left message to return call

## 2023-04-11 ENCOUNTER — Other Ambulatory Visit: Payer: Self-pay | Admitting: Nurse Practitioner

## 2023-04-11 DIAGNOSIS — I429 Cardiomyopathy, unspecified: Secondary | ICD-10-CM

## 2023-04-19 NOTE — Progress Notes (Signed)
Remote ICD transmission.   

## 2023-04-27 ENCOUNTER — Other Ambulatory Visit: Payer: Self-pay | Admitting: Nurse Practitioner

## 2023-04-27 DIAGNOSIS — I429 Cardiomyopathy, unspecified: Secondary | ICD-10-CM

## 2023-05-07 ENCOUNTER — Telehealth: Payer: Self-pay

## 2023-05-07 ENCOUNTER — Ambulatory Visit: Payer: Commercial Managed Care - PPO | Attending: Internal Medicine

## 2023-05-07 DIAGNOSIS — I5022 Chronic systolic (congestive) heart failure: Secondary | ICD-10-CM | POA: Diagnosis not present

## 2023-05-07 DIAGNOSIS — Z95810 Presence of automatic (implantable) cardiac defibrillator: Secondary | ICD-10-CM | POA: Diagnosis not present

## 2023-05-07 NOTE — Progress Notes (Unsigned)
EPIC Encounter for ICM Monitoring  Patient Name: George Barber is a 62 y.o. male Date: 05/07/2023 Primary Care Physican: Bennie Pierini, FNP Primary Cardiologist: Ladona Ridgel Electrophysiologist: Ladona Ridgel 11/23/2022 Office Weight: 140 lbs 02/26/2023 Weight: 140 lbs     Spoke with patient and heart failure questions reviewed.  Transmission results reviewed.  Pt asymptomatic for fluid accumulation.  Reports feeling well at this time and voices no complaints.            CorVue thoracic impedance suggesting intermittent days with possible fluid accumulation within the last month.   Prescribed:  Furosemide 20 mg 1 tablet daily Spironolactone 25 mg take 1 tablet daily   Labs: 05/25/2022 Creatinine 1.01, BUN 14, Potassium 4.9, Sodium 137, GFR 85 03/23/2022 Creatinine 1.14, BUN 10, Potassium 4.0, Sodium 138  01/30/2022 Creatinine 1.09, BUN 13, Potassium 4.5, Sodium 138, GFR 78  10/31/2021 Creatinine 0.87, BUN 14, Potassium 4.7, Sodium 138, GFR 99 A complete set of results can be found in Results Review.   Recommendations:  No changes and encouraged to call if experiencing any fluid symptoms.   Follow-up plan: ICM clinic phone appointment on 06/11/2023.  91 day device clinic remote transmission 07/04/2023.   EP/Cardiology Office Visits:   Recall 07/29/2023 with Dr. Ladona Ridgel.     Copy of ICM check sent to Dr. Ladona Ridgel.   3 month ICM trend: 05/07/2023.    12-14 Month ICM trend:     Karie Soda, RN 05/07/2023 1:47 PM

## 2023-05-07 NOTE — Telephone Encounter (Signed)
ICM call to patient.  Advised to send manual remote transmission to review current fluid levels on remote transmission.  Advised the last update on 10/12 suggesting possible fluid accumulation starting 10/10.  He will send report today.

## 2023-05-25 ENCOUNTER — Encounter: Payer: Self-pay | Admitting: Nurse Practitioner

## 2023-05-25 ENCOUNTER — Ambulatory Visit: Payer: Commercial Managed Care - PPO | Admitting: Nurse Practitioner

## 2023-05-25 VITALS — BP 144/78 | HR 60 | Temp 97.4°F | Resp 20 | Ht 68.0 in | Wt 141.0 lb

## 2023-05-25 DIAGNOSIS — I5022 Chronic systolic (congestive) heart failure: Secondary | ICD-10-CM

## 2023-05-25 DIAGNOSIS — I429 Cardiomyopathy, unspecified: Secondary | ICD-10-CM

## 2023-05-25 DIAGNOSIS — J411 Mucopurulent chronic bronchitis: Secondary | ICD-10-CM

## 2023-05-25 DIAGNOSIS — R7989 Other specified abnormal findings of blood chemistry: Secondary | ICD-10-CM

## 2023-05-25 DIAGNOSIS — I10 Essential (primary) hypertension: Secondary | ICD-10-CM | POA: Diagnosis not present

## 2023-05-25 DIAGNOSIS — F1011 Alcohol abuse, in remission: Secondary | ICD-10-CM

## 2023-05-25 MED ORDER — CARVEDILOL 12.5 MG PO TABS
12.5000 mg | ORAL_TABLET | Freq: Two times a day (BID) | ORAL | 1 refills | Status: DC
Start: 2023-05-25 — End: 2023-11-22

## 2023-05-25 MED ORDER — FUROSEMIDE 20 MG PO TABS: ORAL_TABLET | ORAL | 1 refills | Status: DC

## 2023-05-25 MED ORDER — AMIODARONE HCL 200 MG PO TABS
200.0000 mg | ORAL_TABLET | Freq: Every day | ORAL | 1 refills | Status: DC
Start: 2023-05-25 — End: 2023-11-22

## 2023-05-25 MED ORDER — LOSARTAN POTASSIUM 100 MG PO TABS
50.0000 mg | ORAL_TABLET | Freq: Every day | ORAL | 1 refills | Status: DC
Start: 2023-05-25 — End: 2023-11-22

## 2023-05-25 MED ORDER — SPIRONOLACTONE 25 MG PO TABS: ORAL_TABLET | ORAL | 1 refills | Status: DC

## 2023-05-25 NOTE — Progress Notes (Signed)
Subjective:    Patient ID: [name], male    DOB: [birth year], 62 y.o.   MRN: [id number]   Chief Complaint: medical management of chronic issues     HPI:  George Barber is a 62 y.o. who identifies as a male who was assigned male at birth.   Social history: Lives with: wife Work history: retired   Water engineer in today for follow up of the following chronic medical issues:  1. Primary hypertension No c/o chest pain, sob or headache. Does not check blood pressure at home. BP Readings from Last 3 Encounters:  11/23/22 (!) 141/78  06/27/22 (!) 140/82  05/25/22 (!) 149/86     2. Cardiomyopathy, secondary (HCC) 3. Chronic systolic heart failure (HCC) Last saw cardiology on 06/27/22. He has a pacemaker and has it checked monthly- is still on pacerone  4. Mucopurulent chronic bronchitis (HCC) Uses anoro daily- denies cough or SOB  5. Elevated LFTs Lab Results  Component Value Date   ALT 18 05/25/2022   AST 25 05/25/2022   ALKPHOS 78 05/25/2022   BILITOT 0.4 05/25/2022     6. History of ETOH abuse Has not drank in several years   New complaints: None today  No Known Allergies Outpatient Encounter Medications as of 05/25/2023  Medication Sig   albuterol (VENTOLIN HFA) 108 (90 Base) MCG/ACT inhaler INHALE 2 PUFFS BY MOUTH EVERY 6 HOURS AS NEEDED FOR WHEEZING AND FOR SHORTNESS OF BREATH .   amiodarone (PACERONE) 200 MG tablet TAKE 1 TABLET BY MOUTH ONCE DAILY ON MONDAY THROUGH SATURDAY   aspirin 81 MG tablet Take 81 mg by mouth daily.     carvedilol (COREG) 12.5 MG tablet Take 1 tablet (12.5 mg total) by mouth 2 (two) times daily with a meal. TAKE 1 TABLET BY MOUTH TWICE DAILY WITH MEALS . APPOINTMENT REQUIRED FOR FUTURE REFILLS   Fluticasone-Umeclidin-Vilant (TRELEGY ELLIPTA) 100-62.5-25 MCG/ACT AEPB Inhale 1 puff into the lungs daily.   furosemide (LASIX) 20 MG tablet TAKE 1 TABLET BY MOUTH ONCE DAILY . APPOINTMENT REQUIRED FOR FUTURE REFILLS   losartan (COZAAR) 100  MG tablet Take 0.5 tablets (50 mg total) by mouth daily.   spironolactone (ALDACTONE) 25 MG tablet TAKE 1 TABLET BY MOUTH ONCE DAILY . APPOINTMENT REQUIRED FOR FUTURE REFILLS   umeclidinium-vilanterol (ANORO ELLIPTA) 62.5-25 MCG/ACT AEPB Inhale 1 puff into the lungs daily.   No facility-administered encounter medications on file as of 05/25/2023.    Past Surgical History:  Procedure Laterality Date   2D ECHOCARDIOGRAM  02/2011   CARDIAC CATHETERIZATION     ICD GENERATOR CHANGEOUT N/A 03/23/2022   Procedure: ICD GENERATOR CHANGEOUT;  Surgeon: Marinus Maw, MD;  Location: [facility] INVASIVE CV LAB;  Service: Cardiovascular;  Laterality: N/A;    Family History  Problem Relation Age of Onset   Cancer Mother 64   Coronary artery disease Father        unknown   Emphysema Father       Controlled substance contract: n/a     Review of Systems  Constitutional:  Negative for diaphoresis.  Eyes:  Negative for pain.  Respiratory:  Negative for shortness of breath.   Cardiovascular:  Negative for chest pain, palpitations and leg swelling.  Gastrointestinal:  Negative for abdominal pain.  Endocrine: Negative for polydipsia.  Skin:  Negative for rash.  Neurological:  Negative for dizziness, weakness and headaches.  Hematological:  Does not bruise/bleed easily.  All other systems reviewed and are negative.  Objective:   Physical Exam Vitals and nursing note reviewed.  Constitutional:      Appearance: Normal appearance. He is well-developed.  HENT:     Head: Normocephalic.     Nose: Nose normal.     Mouth/Throat:     Mouth: Mucous membranes are moist.     Pharynx: Oropharynx is clear.  Eyes:     Pupils: Pupils are equal, round, and reactive to light.  Neck:     Thyroid: No thyroid mass or thyromegaly.     Vascular: No carotid bruit or JVD.     Trachea: Phonation normal.  Cardiovascular:     Rate and Rhythm: Normal rate and regular rhythm.  Pulmonary:     Effort: Pulmonary  effort is normal. No respiratory distress.     Breath sounds: Normal breath sounds.  Abdominal:     General: Bowel sounds are normal.     Palpations: Abdomen is soft.     Tenderness: There is no abdominal tenderness.  Musculoskeletal:        General: Normal range of motion.     Cervical back: Normal range of motion and neck supple.  Lymphadenopathy:     Cervical: No cervical adenopathy.  Skin:    General: Skin is warm and dry.  Neurological:     Mental Status: He is alert and oriented to person, place, and time.  Psychiatric:        Behavior: Behavior normal.        Thought Content: Thought content normal.        Judgment: Judgment normal.    BP (!) 144/78   Pulse 60   Temp (!) 97.4 F (36.3 C) (Temporal)   Resp 20   Ht 5\' 8"  (1.727 m)   Wt 141 lb (64 kg)   SpO2 99%   BMI 21.44 kg/m         Assessment & Plan:   George Barber comes in today with chief complaint of Medical Management of Chronic Issues   Diagnosis and orders addressed:  1. Primary hypertension Low sodium diet - losartan (COZAAR) 100 MG tablet; Take 0.5 tablets (50 mg total) by mouth daily.  Dispense: 45 tablet; Refill: 1 - CBC with Differential/Platelet - CMP14+EGFR - Lipid panel  2. Cardiomyopathy, secondary (HCC) Keep yearly follow up with cardiology - amiodarone (PACERONE) 200 MG tablet; Take 1 tablet (200 mg total) by mouth daily.  Dispense: 90 tablet; Refill: 1 - carvedilol (COREG) 12.5 MG tablet; Take 1 tablet (12.5 mg total) by mouth 2 (two) times daily with a meal. TAKE 1 TABLET BY MOUTH TWICE DAILY WITH MEALS . APPOINTMENT REQUIRED FOR FUTURE REFILLS  Dispense: 180 tablet; Refill: 1  3. Chronic systolic heart failure (HCC) - furosemide (LASIX) 20 MG tablet; TAKE 1 TABLET BY MOUTH ONCE DAILY . APPOINTMENT REQUIRED FOR FUTURE REFILLS  Dispense: 90 tablet; Refill: 1 - spironolactone (ALDACTONE) 25 MG tablet; TAKE 1 TABLET BY MOUTH ONCE DAILY . APPOINTMENT REQUIRED FOR FUTURE REFILLS   Dispense: 90 tablet; Refill: 1  4. Mucopurulent chronic bronchitis (HCC) Continue inhalers  5. Elevated LFTs Labs pending  6. History of ETOH abuse Avoid alcohol   Labs pending Health Maintenance reviewed Diet and exercise encouraged  Follow up plan: 6 months   Mary-Margaret Daphine Deutscher, FNP

## 2023-06-11 ENCOUNTER — Ambulatory Visit: Payer: Commercial Managed Care - PPO | Attending: Internal Medicine

## 2023-06-11 DIAGNOSIS — Z95810 Presence of automatic (implantable) cardiac defibrillator: Secondary | ICD-10-CM

## 2023-06-11 DIAGNOSIS — I5022 Chronic systolic (congestive) heart failure: Secondary | ICD-10-CM | POA: Diagnosis not present

## 2023-06-12 ENCOUNTER — Telehealth: Payer: Self-pay

## 2023-06-12 NOTE — Progress Notes (Signed)
EPIC Encounter for ICM Monitoring  Patient Name: George Barber is a 62 y.o. male Date: 06/12/2023 Primary Care Physican: Bennie Pierini, FNP Primary Cardiologist: Ladona Ridgel Electrophysiologist: Ladona Ridgel 11/23/2022 Office Weight: 140 lbs 02/26/2023 Weight: 140 lbs     Attempted call to patient and unable to reach.  Left message to return call. Transmission reviewed.          CorVue thoracic impedance suggesting intermittent days with possible fluid accumulation within the last month.   Prescribed:  Furosemide 20 mg 1 tablet daily Spironolactone 25 mg take 1 tablet daily   Labs: 05/25/2022 Creatinine 1.01, BUN 14, Potassium 4.9, Sodium 137, GFR 85 03/23/2022 Creatinine 1.14, BUN 10, Potassium 4.0, Sodium 138  01/30/2022 Creatinine 1.09, BUN 13, Potassium 4.5, Sodium 138, GFR 78  10/31/2021 Creatinine 0.87, BUN 14, Potassium 4.7, Sodium 138, GFR 99 A complete set of results can be found in Results Review.   Recommendations:  Unable to reach.     Follow-up plan: ICM clinic phone appointment on 07/16/2023.  91 day device clinic remote transmission 07/04/2023.   EP/Cardiology Office Visits:   Recall 07/29/2023 with Dr. Ladona Ridgel.     Copy of ICM check sent to Dr. Ladona Ridgel.   3 month ICM trend: 06/11/2023.    12-14 Month ICM trend:     Karie Soda, RN 06/12/2023 6:24 AM

## 2023-06-12 NOTE — Telephone Encounter (Signed)
Remote ICM transmission received.  Attempted call to patient regarding ICM remote transmission and left message with ICM phone number to return call.4.

## 2023-07-04 ENCOUNTER — Ambulatory Visit (INDEPENDENT_AMBULATORY_CARE_PROVIDER_SITE_OTHER): Payer: Commercial Managed Care - PPO

## 2023-07-04 DIAGNOSIS — I428 Other cardiomyopathies: Secondary | ICD-10-CM | POA: Diagnosis not present

## 2023-07-04 LAB — CUP PACEART REMOTE DEVICE CHECK
Battery Remaining Longevity: 80 mo
Battery Remaining Percentage: 87 %
Battery Voltage: 3.08 V
Brady Statistic RV Percent Paced: 1 %
Date Time Interrogation Session: 20241211020015
HighPow Impedance: 83 Ohm
HighPow Impedance: 83 Ohm
Implantable Lead Connection Status: 753985
Implantable Lead Implant Date: 20120813
Implantable Lead Location: 753860
Implantable Lead Model: 180
Implantable Lead Serial Number: 303783
Implantable Pulse Generator Implant Date: 20230831
Lead Channel Impedance Value: 290 Ohm
Lead Channel Pacing Threshold Amplitude: 1.5 V
Lead Channel Pacing Threshold Pulse Width: 0.8 ms
Lead Channel Sensing Intrinsic Amplitude: 9.9 mV
Lead Channel Setting Pacing Amplitude: 3 V
Lead Channel Setting Pacing Pulse Width: 0.8 ms
Lead Channel Setting Sensing Sensitivity: 0.5 mV
Pulse Gen Serial Number: 8943728

## 2023-07-16 ENCOUNTER — Ambulatory Visit: Payer: Commercial Managed Care - PPO | Attending: Internal Medicine

## 2023-07-16 DIAGNOSIS — I5022 Chronic systolic (congestive) heart failure: Secondary | ICD-10-CM | POA: Diagnosis not present

## 2023-07-16 DIAGNOSIS — Z95810 Presence of automatic (implantable) cardiac defibrillator: Secondary | ICD-10-CM | POA: Diagnosis not present

## 2023-07-19 NOTE — Progress Notes (Signed)
EPIC Encounter for ICM Monitoring  Patient Name: George Barber is a 62 y.o. male Date: 07/19/2023 Primary Care Physican: Bennie Pierini, FNP Primary Cardiologist: Ladona Ridgel Electrophysiologist: Ladona Ridgel 11/23/2022 Office Weight: 140 lbs 02/26/2023 Weight: 140 lbs     Spoke with patient and heart failure questions reviewed.  Transmission results reviewed.  Pt asymptomatic for fluid accumulation.  Reports feeling well at this time and voices no complaints.            CorVue thoracic impedance suggesting intermittent days with possible fluid accumulation within the last month. 12/9-12/15.   Prescribed:  Furosemide 20 mg 1 tablet daily Spironolactone 25 mg take 1 tablet daily   Labs: 05/25/2022 Creatinine 1.01, BUN 14, Potassium 4.9, Sodium 137, GFR 85 03/23/2022 Creatinine 1.14, BUN 10, Potassium 4.0, Sodium 138  01/30/2022 Creatinine 1.09, BUN 13, Potassium 4.5, Sodium 138, GFR 78  10/31/2021 Creatinine 0.87, BUN 14, Potassium 4.7, Sodium 138, GFR 99 A complete set of results can be found in Results Review.   Recommendations:  No changes and encouraged to call if experiencing any fluid symptoms.   Follow-up plan: ICM clinic phone appointment on 08/20/2023.  91 day device clinic remote transmission 10/03/2023.   EP/Cardiology Office Visits:   Provided EP scheduler office number to call for yearly appt.  Recall 07/29/2023 with Dr. Ladona Ridgel.     Copy of ICM check sent to Dr. Ladona Ridgel.   3 month ICM trend: 07/16/2023.    12-14 Month ICM trend:     Karie Soda, RN 07/19/2023 3:46 PM

## 2023-08-10 NOTE — Progress Notes (Signed)
Remote ICD transmission.   

## 2023-08-20 ENCOUNTER — Telehealth: Payer: Self-pay

## 2023-08-20 ENCOUNTER — Ambulatory Visit: Payer: Commercial Managed Care - PPO | Attending: Internal Medicine

## 2023-08-20 DIAGNOSIS — Z95810 Presence of automatic (implantable) cardiac defibrillator: Secondary | ICD-10-CM

## 2023-08-20 DIAGNOSIS — I5022 Chronic systolic (congestive) heart failure: Secondary | ICD-10-CM

## 2023-08-20 NOTE — Telephone Encounter (Signed)
Spoke with patient and advised home device monitor is showing disconnected.  His monitor is connected to landline.  He will check when he gets home if it is unplugged or if the landline is not working.  Will attempt to send manual remote transmission.

## 2023-08-21 NOTE — Progress Notes (Signed)
EPIC Encounter for ICM Monitoring  Patient Name: George Barber is a 63 y.o. male Date: 08/21/2023 Primary Care Physican: Bennie Pierini, FNP Primary Cardiologist: Ladona Ridgel Electrophysiologist: Ladona Ridgel 11/23/2022 Office Weight: 140 lbs 02/26/2023 Weight: 140 lbs 05/25/2023 Office Weight: 141 lbs     Spoke with patient and heart failure questions reviewed.  Transmission results reviewed.  Pt asymptomatic for fluid accumulation.  Reports feeling well at this time and voices no complaints.            Diet:  Does not adhere to low salt diet        CorVue thoracic impedance suggesting possible fluid accumulation starting 1/22 but trending back to baseline.   Prescribed:  Furosemide 20 mg 1 tablet daily Spironolactone 25 mg take 1 tablet daily   Labs: 05/25/2022 Creatinine 1.01, BUN 14, Potassium 4.9, Sodium 137, GFR 85 03/23/2022 Creatinine 1.14, BUN 10, Potassium 4.0, Sodium 138  01/30/2022 Creatinine 1.09, BUN 13, Potassium 4.5, Sodium 138, GFR 78  10/31/2021 Creatinine 0.87, BUN 14, Potassium 4.7, Sodium 138, GFR 99 A complete set of results can be found in Results Review.   Recommendations:  Advised to take extra Furosemide 1 tablet x 1 day and then return to 1 tablet daily.   Follow-up plan: ICM clinic phone appointment on 08/27/2023 to recheck fluid levels.  91 day device clinic remote transmission 10/03/2023.   EP/Cardiology Office Visits:   Provided EP scheduler office number to call for yearly appt.  Recall 07/29/2023 with Dr. Ladona Ridgel.     Copy of ICM check sent to Dr. Ladona Ridgel.   3 month ICM trend: 08/20/2023.    12-14 Month ICM trend:     Karie Soda, RN 08/21/2023 2:54 PM

## 2023-08-27 ENCOUNTER — Ambulatory Visit: Payer: Commercial Managed Care - PPO | Attending: Internal Medicine

## 2023-08-27 DIAGNOSIS — I5022 Chronic systolic (congestive) heart failure: Secondary | ICD-10-CM

## 2023-08-27 DIAGNOSIS — Z95810 Presence of automatic (implantable) cardiac defibrillator: Secondary | ICD-10-CM

## 2023-09-24 ENCOUNTER — Ambulatory Visit: Payer: Commercial Managed Care - PPO | Attending: Internal Medicine

## 2023-09-24 DIAGNOSIS — I5022 Chronic systolic (congestive) heart failure: Secondary | ICD-10-CM

## 2023-09-24 DIAGNOSIS — Z95810 Presence of automatic (implantable) cardiac defibrillator: Secondary | ICD-10-CM | POA: Diagnosis not present

## 2023-09-26 NOTE — Progress Notes (Addendum)
 EPIC Encounter for ICM Monitoring  Patient Name: George Barber is a 63 y.o. male Date: 09/26/2023 Primary Care Physican: Bennie Pierini, FNP Primary Cardiologist: Ladona Ridgel Electrophysiologist: Ladona Ridgel 09/26/2023 Weight: 142-144 lbs     Spoke with patient and heart failure questions reviewed.  Transmission results reviewed.  Pt asymptomatic for fluid accumulation.  Reports feeling well at this time and voices no complaints.            Diet:  Does not adhere to low salt diet         CorVue thoracic impedance suggesting normal fluid levels within the last month.   Prescribed:  Furosemide 20 mg 1 tablet daily Spironolactone 25 mg take 1 tablet daily   Labs: 05/25/2022 Creatinine 1.01, BUN 14, Potassium 4.9, Sodium 137, GFR 85 03/23/2022 Creatinine 1.14, BUN 10, Potassium 4.0, Sodium 138  01/30/2022 Creatinine 1.09, BUN 13, Potassium 4.5, Sodium 138, GFR 78  10/31/2021 Creatinine 0.87, BUN 14, Potassium 4.7, Sodium 138, GFR 99 A complete set of results can be found in Results Review.   Recommendations: No changes and encouraged to call if experiencing any fluid symptoms.   Follow-up plan: ICM clinic phone appointment on 10/29/2023.  91 day device clinic remote transmission 01/02/2024.   EP/Cardiology Office Visits:  Has number for EP scheduler to call for yearly appt.  Recall 07/29/2023 with Dr. Ladona Ridgel.     Copy of ICM check sent to Dr. Ladona Ridgel.   3 month ICM trend: 09/24/2023.    12-14 Month ICM trend:     Karie Soda, RN 09/26/2023 4:26 PM

## 2023-10-03 ENCOUNTER — Ambulatory Visit: Payer: Commercial Managed Care - PPO

## 2023-10-03 DIAGNOSIS — I428 Other cardiomyopathies: Secondary | ICD-10-CM

## 2023-10-04 ENCOUNTER — Encounter: Payer: Self-pay | Admitting: Internal Medicine

## 2023-10-04 LAB — CUP PACEART REMOTE DEVICE CHECK
Battery Remaining Longevity: 77 mo
Battery Remaining Percentage: 85 %
Battery Voltage: 3.07 V
Brady Statistic RV Percent Paced: 1 %
Date Time Interrogation Session: 20250312202103
HighPow Impedance: 80 Ohm
HighPow Impedance: 80 Ohm
Implantable Lead Connection Status: 753985
Implantable Lead Implant Date: 20120813
Implantable Lead Location: 753860
Implantable Lead Model: 180
Implantable Lead Serial Number: 303783
Implantable Pulse Generator Implant Date: 20230831
Lead Channel Impedance Value: 280 Ohm
Lead Channel Pacing Threshold Amplitude: 1.5 V
Lead Channel Pacing Threshold Pulse Width: 0.8 ms
Lead Channel Sensing Intrinsic Amplitude: 9.7 mV
Lead Channel Setting Pacing Amplitude: 3 V
Lead Channel Setting Pacing Pulse Width: 0.8 ms
Lead Channel Setting Sensing Sensitivity: 0.5 mV
Pulse Gen Serial Number: 8943728

## 2023-10-29 ENCOUNTER — Ambulatory Visit: Attending: Internal Medicine

## 2023-10-29 DIAGNOSIS — I5022 Chronic systolic (congestive) heart failure: Secondary | ICD-10-CM | POA: Diagnosis not present

## 2023-10-29 DIAGNOSIS — Z95810 Presence of automatic (implantable) cardiac defibrillator: Secondary | ICD-10-CM

## 2023-10-31 NOTE — Progress Notes (Signed)
 EPIC Encounter for ICM Monitoring  Patient Name: George Barber is a 63 y.o. male Date: 10/31/2023 Primary Care Physican: Bennie Pierini, FNP Primary Cardiologist: Ladona Ridgel Electrophysiologist: Ladona Ridgel 09/26/2023 Weight: 142-144 lbs    Spoke with patient and heart failure questions reviewed.  Transmission results reviewed.  Pt asymptomatic for fluid accumulation.  Reports feeling well at this time and voices no complaints.              Diet:  Does not adhere to low salt diet         CorVue thoracic impedance suggesting normal fluid levels with the exception of possible fluid accumulation 3/8-3/16.   Prescribed:  Furosemide 20 mg 1 tablet daily Spironolactone 25 mg take 1 tablet daily   Labs: 05/25/2022 Creatinine 1.01, BUN 14, Potassium 4.9, Sodium 137, GFR 85 03/23/2022 Creatinine 1.14, BUN 10, Potassium 4.0, Sodium 138  01/30/2022 Creatinine 1.09, BUN 13, Potassium 4.5, Sodium 138, GFR 78  10/31/2021 Creatinine 0.87, BUN 14, Potassium 4.7, Sodium 138, GFR 99 A complete set of results can be found in Results Review.   Recommendations:   No changes and encouraged to call if experiencing any fluid symptoms.   Follow-up plan: ICM clinic phone appointment on 12/03/2023.  91 day device clinic remote transmission 01/02/2024.   EP/Cardiology Office Visits:  Has number for EP scheduler to call for yearly appt.  Recall 07/29/2023 with Dr. Ladona Ridgel.     Copy of ICM check sent to Dr. Ladona Ridgel.   3 month ICM trend: 10/29/2023.    12-14 Month ICM trend:     Karie Soda, RN 10/31/2023 10:35 AM

## 2023-11-19 NOTE — Progress Notes (Signed)
 Remote ICD transmission.

## 2023-11-19 NOTE — Addendum Note (Signed)
 Addended by: Lott Rouleau A on: 11/19/2023 11:27 AM   Modules accepted: Orders

## 2023-11-22 ENCOUNTER — Encounter: Payer: Self-pay | Admitting: Nurse Practitioner

## 2023-11-22 ENCOUNTER — Ambulatory Visit: Payer: Commercial Managed Care - PPO | Admitting: Nurse Practitioner

## 2023-11-22 VITALS — BP 144/83 | HR 63 | Temp 97.3°F | Ht 68.0 in | Wt 144.0 lb

## 2023-11-22 DIAGNOSIS — I10 Essential (primary) hypertension: Secondary | ICD-10-CM

## 2023-11-22 DIAGNOSIS — Z0000 Encounter for general adult medical examination without abnormal findings: Secondary | ICD-10-CM

## 2023-11-22 DIAGNOSIS — I5022 Chronic systolic (congestive) heart failure: Secondary | ICD-10-CM | POA: Diagnosis not present

## 2023-11-22 DIAGNOSIS — J411 Mucopurulent chronic bronchitis: Secondary | ICD-10-CM

## 2023-11-22 DIAGNOSIS — F1011 Alcohol abuse, in remission: Secondary | ICD-10-CM

## 2023-11-22 DIAGNOSIS — Z0001 Encounter for general adult medical examination with abnormal findings: Secondary | ICD-10-CM | POA: Diagnosis not present

## 2023-11-22 DIAGNOSIS — I429 Cardiomyopathy, unspecified: Secondary | ICD-10-CM | POA: Diagnosis not present

## 2023-11-22 DIAGNOSIS — R7989 Other specified abnormal findings of blood chemistry: Secondary | ICD-10-CM

## 2023-11-22 MED ORDER — CARVEDILOL 12.5 MG PO TABS: 12.5000 mg | ORAL_TABLET | Freq: Two times a day (BID) | ORAL | 1 refills | Status: DC

## 2023-11-22 MED ORDER — SPIRONOLACTONE 25 MG PO TABS: ORAL_TABLET | ORAL | 1 refills | Status: DC

## 2023-11-22 MED ORDER — ANORO ELLIPTA 62.5-25 MCG/ACT IN AEPB: 1.0000 | INHALATION_SPRAY | Freq: Every day | RESPIRATORY_TRACT | 3 refills | Status: DC

## 2023-11-22 MED ORDER — AMIODARONE HCL 200 MG PO TABS: 200.0000 mg | ORAL_TABLET | Freq: Every day | ORAL | 1 refills | Status: DC

## 2023-11-22 MED ORDER — LOSARTAN POTASSIUM 100 MG PO TABS: 50.0000 mg | ORAL_TABLET | Freq: Every day | ORAL | 1 refills | Status: DC

## 2023-11-22 MED ORDER — FUROSEMIDE 20 MG PO TABS: ORAL_TABLET | ORAL | 1 refills | Status: DC

## 2023-11-22 NOTE — Progress Notes (Signed)
 Subjective:    Patient ID: George Barber, male    DOB: 1961-05-29, 63 y.o.   MRN: [id number]   Chief Complaint: annual physical    HPI:  George Barber is a 64 y.o. who identifies as a male who was assigned male at birth.   Social history: Lives with: wife Work history: retired   Water engineer in today for follow up of the following chronic medical issues:  1. Primary hypertension No c/o chest pain, sob or headache. Does not check blood pressure at home. BP Readings from Last 3 Encounters:  05/25/23 (!) 144/78  11/23/22 (!) 141/78  06/27/22 (!) 140/82     2. Cardiomyopathy, secondary (HCC) 3. Chronic systolic heart failure (HCC) Last saw cardiology on 06/27/22. He has a pacemaker and has it checked monthly- is still on pacerone . He has not seen cardiologist in person in awhile.  4. Mucopurulent chronic bronchitis (HCC) Uses anoro daily- denies cough or SOB  5. Elevated LFTs Lab Results  Component Value Date   ALT 18 05/25/2022   AST 25 05/25/2022   ALKPHOS 78 05/25/2022   BILITOT 0.4 05/25/2022     6. History of ETOH abuse Has not drank in several years   New complaints: None today  No Known Allergies Outpatient Encounter Medications as of 11/22/2023  Medication Sig   albuterol  (VENTOLIN  HFA) 108 (90 Base) MCG/ACT inhaler INHALE 2 PUFFS BY MOUTH EVERY 6 HOURS AS NEEDED FOR WHEEZING AND FOR SHORTNESS OF BREATH .   amiodarone  (PACERONE ) 200 MG tablet Take 1 tablet (200 mg total) by mouth daily.   aspirin 81 MG tablet Take 81 mg by mouth daily.     carvedilol  (COREG ) 12.5 MG tablet Take 1 tablet (12.5 mg total) by mouth 2 (two) times daily with a meal. TAKE 1 TABLET BY MOUTH TWICE DAILY WITH MEALS . APPOINTMENT REQUIRED FOR FUTURE REFILLS   Fluticasone-Umeclidin-Vilant (TRELEGY ELLIPTA ) 100-62.5-25 MCG/ACT AEPB Inhale 1 puff into the lungs daily.   furosemide  (LASIX ) 20 MG tablet TAKE 1 TABLET BY MOUTH ONCE DAILY . APPOINTMENT REQUIRED FOR FUTURE REFILLS   losartan   (COZAAR ) 100 MG tablet Take 0.5 tablets (50 mg total) by mouth daily.   spironolactone  (ALDACTONE ) 25 MG tablet TAKE 1 TABLET BY MOUTH ONCE DAILY . APPOINTMENT REQUIRED FOR FUTURE REFILLS   umeclidinium-vilanterol (ANORO ELLIPTA ) 62.5-25 MCG/ACT AEPB Inhale 1 puff into the lungs daily.   No facility-administered encounter medications on file as of 11/22/2023.    Past Surgical History:  Procedure Laterality Date   2D ECHOCARDIOGRAM  02/2011   CARDIAC CATHETERIZATION     ICD GENERATOR CHANGEOUT N/A 03/23/2022   Procedure: ICD GENERATOR CHANGEOUT;  Surgeon: Tammie Fall, MD;  Location: [facility] INVASIVE CV LAB;  Service: Cardiovascular;  Laterality: N/A;    Family History  Problem Relation Age of Onset   Cancer Mother 75   Coronary artery disease Father        unknown   Emphysema Father       Controlled substance contract: n/a     Review of Systems  Constitutional:  Negative for diaphoresis.  Eyes:  Negative for pain.  Respiratory:  Negative for shortness of breath.   Cardiovascular:  Negative for chest pain, palpitations and leg swelling.  Gastrointestinal:  Negative for abdominal pain.  Endocrine: Negative for polydipsia.  Skin:  Negative for rash.  Neurological:  Negative for dizziness, weakness and headaches.  Hematological:  Does not bruise/bleed easily.  All other systems reviewed and are negative.  Objective:   Physical Exam Vitals and nursing note reviewed.  Constitutional:      Appearance: Normal appearance. He is well-developed.  HENT:     Head: Normocephalic.     Nose: Nose normal.     Mouth/Throat:     Mouth: Mucous membranes are moist.     Pharynx: Oropharynx is clear.  Eyes:     Pupils: Pupils are equal, round, and reactive to light.  Neck:     Thyroid: No thyroid mass or thyromegaly.     Vascular: No carotid bruit or JVD.     Trachea: Phonation normal.  Cardiovascular:     Rate and Rhythm: Normal rate and regular rhythm.  Pulmonary:     Effort:  Pulmonary effort is normal. No respiratory distress.     Breath sounds: Normal breath sounds.  Abdominal:     General: Bowel sounds are normal.     Palpations: Abdomen is soft.     Tenderness: There is no abdominal tenderness.  Musculoskeletal:        General: Normal range of motion.     Cervical back: Normal range of motion and neck supple.  Lymphadenopathy:     Cervical: No cervical adenopathy.  Skin:    General: Skin is warm and dry.  Neurological:     Mental Status: He is alert and oriented to person, place, and time.  Psychiatric:        Behavior: Behavior normal.        Thought Content: Thought content normal.        Judgment: Judgment normal.    BP (!) 144/83   Pulse 63   Temp (!) 97.3 F (36.3 C) (Temporal)   Ht 5\' 8"  (1.727 m)   Wt 144 lb (65.3 kg)   SpO2 95%   BMI 21.90 kg/m          Assessment & Plan:   George Barber comes in today with chief complaint of annual physical  Diagnosis and orders addressed:  1. Primary hypertension Low sodium diet - losartan  (COZAAR ) 100 MG tablet; Take 0.5 tablets (50 mg total) by mouth daily.  Dispense: 45 tablet; Refill: 1 - CBC with Differential/Platelet - CMP14+EGFR - Lipid panel  2. Cardiomyopathy, secondary (HCC) Keep yearly follow up with cardiology - amiodarone  (PACERONE ) 200 MG tablet; Take 1 tablet (200 mg total) by mouth daily.  Dispense: 90 tablet; Refill: 1 - carvedilol  (COREG ) 12.5 MG tablet; Take 1 tablet (12.5 mg total) by mouth 2 (two) times daily with a meal. TAKE 1 TABLET BY MOUTH TWICE DAILY WITH MEALS . APPOINTMENT REQUIRED FOR FUTURE REFILLS  Dispense: 180 tablet; Refill: 1  3. Chronic systolic heart failure (HCC) - furosemide  (LASIX ) 20 MG tablet; TAKE 1 TABLET BY MOUTH ONCE DAILY . APPOINTMENT REQUIRED FOR FUTURE REFILLS  Dispense: 90 tablet; Refill: 1 - spironolactone  (ALDACTONE ) 25 MG tablet; TAKE 1 TABLET BY MOUTH ONCE DAILY . APPOINTMENT REQUIRED FOR FUTURE REFILLS  Dispense: 90 tablet;  Refill: 1  4. Mucopurulent chronic bronchitis (HCC) Continue inhalers  5. Elevated LFTs Labs pending  6. History of ETOH abuse Avoid alcohol   Labs pending Health Maintenance reviewed Diet and exercise encouraged  Follow up plan: 6 months   Mary-Margaret Gaylyn Keas, FNP

## 2023-11-27 ENCOUNTER — Other Ambulatory Visit (HOSPITAL_COMMUNITY): Payer: Self-pay

## 2023-11-27 ENCOUNTER — Telehealth: Payer: Self-pay

## 2023-11-27 NOTE — Telephone Encounter (Signed)
 Pharmacy Patient Advocate Encounter   Received notification from CoverMyMeds that prior authorization for ANORO ELLIPTA  62.5-25 MCG INH is required/requested.   Insurance verification completed.   The patient is insured through [employer] .   Per test claim: PA required; PA submitted to above mentioned insurance via Prompt PA Key/confirmation #/EOC [id number] Status is pending

## 2023-11-28 ENCOUNTER — Other Ambulatory Visit (HOSPITAL_COMMUNITY): Payer: Self-pay

## 2023-11-28 NOTE — Telephone Encounter (Addendum)
 Pharmacy Patient Advocate Encounter  Received notification from [employer] that Prior Authorization for ANORO ELLIPTA  62.5-25 MCG INH has been DENIED.  No reason given; No denial letter received via Fax or CMM. It has been requested and will be uploaded to the media tab once received.   PA #/Case ID/Reference #: [id number]  *According to the insurance company this medication is locked into [facility] and that is where the patient has to get it filled. They looked it up and it is set to be mailed out to the patient either today or tomorrow.

## 2023-11-29 NOTE — Telephone Encounter (Signed)
 Per note patient is getting from a specialty pharmacy and they are mailing out today or tomorrow

## 2023-11-29 NOTE — Telephone Encounter (Signed)
 Patient needs to check with insurance and see what they will pay for.

## 2023-11-30 ENCOUNTER — Other Ambulatory Visit: Payer: Self-pay | Admitting: Nurse Practitioner

## 2023-12-03 ENCOUNTER — Ambulatory Visit: Attending: Internal Medicine

## 2023-12-03 DIAGNOSIS — Z95810 Presence of automatic (implantable) cardiac defibrillator: Secondary | ICD-10-CM

## 2023-12-03 DIAGNOSIS — I5022 Chronic systolic (congestive) heart failure: Secondary | ICD-10-CM | POA: Diagnosis not present

## 2023-12-05 NOTE — Progress Notes (Signed)
 EPIC Encounter for ICM Monitoring  Patient Name: George Barber is a 63 y.o. male Date: 12/05/2023 Primary Care Physican: Delfina Feller, FNP Primary Cardiologist: Carolynne Citron Electrophysiologist: Carolynne Citron 09/26/2023 Weight: 142-144 lbs  11/22/2023 Office Weight: 144 lbs 12/05/2023 Weight: 143 lbs   Spoke with patient and heart failure questions reviewed.  Transmission results reviewed.  Pt asymptomatic for fluid accumulation.   No fluid sx during decreased impedance.          Diet:  Does not adhere to low salt diet         CorVue thoracic impedance suggesting possible fluid accumulation from 4/22-4/27, 4/29-5/2 and 5/6-5/11.   Prescribed:  Furosemide  20 mg 1 tablet daily Spironolactone  25 mg take 1 tablet daily   Labs: 05/25/2022 Creatinine 1.01, BUN 14, Potassium 4.9, Sodium 137, GFR 85 03/23/2022 Creatinine 1.14, BUN 10, Potassium 4.0, Sodium 138  01/30/2022 Creatinine 1.09, BUN 13, Potassium 4.5, Sodium 138, GFR 78  10/31/2021 Creatinine 0.87, BUN 14, Potassium 4.7, Sodium 138, GFR 99 A complete set of results can be found in Results Review.   Recommendations: No changes and encouraged to call if experiencing any fluid symptoms.   Follow-up plan: ICM clinic phone appointment on 01/07/2024.  91 day device clinic remote transmission 01/02/2024.   EP/Cardiology Office Visits:  01/09/2024 with Mertha Abrahams, PA.     Copy of ICM check sent to Dr. Carolynne Citron.   3 month ICM trend: 12/03/2023.    12-14 Month ICM trend:     Almyra Jain, RN 12/05/2023 9:28 AM

## 2023-12-31 ENCOUNTER — Other Ambulatory Visit: Payer: Self-pay | Admitting: Nurse Practitioner

## 2023-12-31 DIAGNOSIS — J411 Mucopurulent chronic bronchitis: Secondary | ICD-10-CM

## 2023-12-31 MED ORDER — ALBUTEROL SULFATE HFA 108 (90 BASE) MCG/ACT IN AERS: INHALATION_SPRAY | RESPIRATORY_TRACT | 3 refills | Status: DC

## 2023-12-31 NOTE — Telephone Encounter (Signed)
 Copied from CRM [id number]. Topic: Clinical - Medication Refill >> Dec 31, 2023  8:53 AM Blair Bumpers wrote: Medication: albuterol  (VENTOLIN  HFA) 108 (90 Base) MCG/ACT inhaler   Has the patient contacted their pharmacy? No (Agent: If no, request that the patient contact the pharmacy for the refill. If patient does not wish to contact the pharmacy document the reason why and proceed with request.) (Agent: If yes, when and what did the pharmacy advise?)  This is the patient's preferred pharmacy:  [id number] - [place], [place] - [address] [place] [address] Phone: [phone] Fax: [phone]   Is this the correct pharmacy for this prescription? Yes If no, delete pharmacy and type the correct one.   Has the prescription been filled recently? Yes  Is the patient out of the medication? Yes  Has the patient been seen for an appointment in the last year OR does the patient have an upcoming appointment? Yes  Can we respond through MyChart? Yes  Agent: Please be advised that Rx refills may take up to 3 business days. We ask that you follow-up with your pharmacy.

## 2024-01-02 ENCOUNTER — Ambulatory Visit (INDEPENDENT_AMBULATORY_CARE_PROVIDER_SITE_OTHER): Payer: Commercial Managed Care - PPO

## 2024-01-02 ENCOUNTER — Telehealth: Payer: Self-pay

## 2024-01-02 DIAGNOSIS — I428 Other cardiomyopathies: Secondary | ICD-10-CM

## 2024-01-02 LAB — CUP PACEART REMOTE DEVICE CHECK
Battery Remaining Longevity: 77 mo
Battery Remaining Percentage: 83 %
Battery Voltage: 3.05 V
Brady Statistic RV Percent Paced: 1 %
Date Time Interrogation Session: 20250611125018
HighPow Impedance: 83 Ohm
HighPow Impedance: 83 Ohm
Implantable Lead Connection Status: 753985
Implantable Lead Implant Date: 20120813
Implantable Lead Location: 753860
Implantable Lead Model: 180
Implantable Lead Serial Number: 303783
Implantable Pulse Generator Implant Date: 20230831
Lead Channel Impedance Value: 290 Ohm
Lead Channel Pacing Threshold Amplitude: 1.5 V
Lead Channel Pacing Threshold Pulse Width: 0.8 ms
Lead Channel Sensing Intrinsic Amplitude: 10.1 mV
Lead Channel Setting Pacing Amplitude: 3 V
Lead Channel Setting Pacing Pulse Width: 0.8 ms
Lead Channel Setting Sensing Sensitivity: 0.5 mV
Pulse Gen Serial Number: 8943728

## 2024-01-02 NOTE — Telephone Encounter (Signed)
 Returned call to patient.  He asked if the remote transmission was received today.  He sent it after getting a message that it did not come in automatically.  Confirmed the report was received and suggesting his fluid level were normal with the exception of possible fluid accumulation from 6/3-6/8.  He did report SOB over the weekend and Monday but feels fine now.  He was not sure if he had fluid causing the SOB but has resolved.  He reports everything is going well.  Advised will recheck fluid levels next month and to call if he experiences any fluid symptoms.

## 2024-01-04 ENCOUNTER — Ambulatory Visit: Attending: Internal Medicine

## 2024-01-04 DIAGNOSIS — Z95810 Presence of automatic (implantable) cardiac defibrillator: Secondary | ICD-10-CM

## 2024-01-04 DIAGNOSIS — I5022 Chronic systolic (congestive) heart failure: Secondary | ICD-10-CM

## 2024-01-04 NOTE — Progress Notes (Signed)
 EPIC Encounter for ICM Monitoring  Patient Name: George Barber is a 63 y.o. male Date: 01/04/2024 Primary Care Physican: Delfina Feller, FNP Primary Cardiologist: Carolynne Citron Electrophysiologist: Carolynne Citron 09/26/2023 Weight: 142-144 lbs  11/22/2023 Office Weight: 144 lbs 12/05/2023 Weight: 143 lbs   Spoke with on 6/11 (see phone note)          Diet:  Does not adhere to low salt diet         CorVue thoracic impedance suggesting possible fluid accumulation from 6/3-6/8 which correlates with patient reported SOB during that time.   Prescribed:  Furosemide  20 mg 1 tablet daily Spironolactone  25 mg take 1 tablet daily   Labs: 05/25/2022 Creatinine 1.01, BUN 14, Potassium 4.9, Sodium 137, GFR 85 03/23/2022 Creatinine 1.14, BUN 10, Potassium 4.0, Sodium 138  01/30/2022 Creatinine 1.09, BUN 13, Potassium 4.5, Sodium 138, GFR 78  10/31/2021 Creatinine 0.87, BUN 14, Potassium 4.7, Sodium 138, GFR 99 A complete set of results can be found in Results Review.   Recommendations: No changes.   Follow-up plan: ICM clinic phone appointment on 02/18/2024.  91 day device clinic remote transmission 04/02/2024.   EP/Cardiology Office Visits:  01/09/2024 with Mertha Abrahams, PA.     Copy of ICM check sent to Dr. Carolynne Citron.   3 month ICM trend: 01/02/2024.    12-14 Month ICM trend:     Almyra Jain, RN 01/04/2024 7:29 AM

## 2024-01-06 ENCOUNTER — Ambulatory Visit: Payer: Self-pay | Admitting: Internal Medicine

## 2024-01-07 ENCOUNTER — Encounter

## 2024-01-07 NOTE — Progress Notes (Unsigned)
 Cardiology Office Note Date:  01/07/2024  Patient ID:  George Barber, DOB [birth year], MRN [id number] PCP:  Delfina Feller, FNP  Electrophysiologist: Dr. Carolynne Citron    Chief Complaint:  *** annual visit  History of Present Illness: George Barber is a 63 y.o. male with history of  NICM, NSVT, ICD emphysema  He comes in today to be seen for Dr. Carolynne Citron, last seen by him Nov 2021, amio was reduced to Mon-Sat, none on Sunday, reported class II HF symptoms.  Bp was up but felt 2/2 stress at the time.  More recently he saw A. Tillery, PA-C 10/31/21, doing well, planned for labs  Seen by myself  and GT since then  Last Dr. Carolynne Citron 06/27/22, post gen change, still working, busy, helps care for grand kids as well. Discussed reducing amio further over time if no arrhythmia  TODAY  *** reduce amio of no VT > 100mg  perhaps, plan to off *** amio labs *** symptoms  Device information Abbott single chamber ICD implanted 04/06/2011 by procedure note (for unclear reasons, pacerart notes 03/06/11 and device reports 09/01/2013) > gen change 03/23/2022  AAD Hx Amiodarone  started 2012 (started at time of his initial diagnosis of CHF/NICM observed to have, EF at that time 15%, frequent NSVTs during his hospital stay)   Past Medical History:  Diagnosis Date   Elevated LFTs    Emphysema    Fluttering heart    NICM (nonischemic cardiomyopathy) (HCC)    cath 12/13/10: Normal cors, EF 10-15%;  b. echo 5/12 EF 15%, mild MR, mod LAE, mild RVE, mild to mod RAE, mild to mod TR, PASP 44   NSVT (nonsustained ventricular tachycardia) (HCC)    Life Vest; amiodarone  rx   Systolic CHF, chronic (HCC)     Past Surgical History:  Procedure Laterality Date   2D ECHOCARDIOGRAM  02/2011   CARDIAC CATHETERIZATION     ICD GENERATOR CHANGEOUT N/A 03/23/2022   Procedure: ICD GENERATOR CHANGEOUT;  Surgeon: Tammie Fall, MD;  Location: [facility] INVASIVE CV LAB;  Service: Cardiovascular;  Laterality: N/A;    Current  Outpatient Medications  Medication Sig Dispense Refill   albuterol  (VENTOLIN  HFA) 108 (90 Base) MCG/ACT inhaler INHALE 2 PUFFS BY MOUTH EVERY 6 HOURS AS NEEDED FOR WHEEZING AND FOR SHORTNESS OF BREATH . 18 g 3   amiodarone  (PACERONE ) 200 MG tablet Take 1 tablet (200 mg total) by mouth daily. 90 tablet 1   aspirin 81 MG tablet Take 81 mg by mouth daily.       carvedilol  (COREG ) 12.5 MG tablet Take 1 tablet (12.5 mg total) by mouth 2 (two) times daily with a meal. TAKE 1 TABLET BY MOUTH TWICE DAILY WITH MEALS . APPOINTMENT REQUIRED FOR FUTURE REFILLS 180 tablet 1   Fluticasone-Umeclidin-Vilant (TRELEGY ELLIPTA ) 100-62.5-25 MCG/ACT AEPB Inhale 1 puff into the lungs daily. 1 each 11   furosemide  (LASIX ) 20 MG tablet TAKE 1 TABLET BY MOUTH ONCE DAILY . APPOINTMENT REQUIRED FOR FUTURE REFILLS 90 tablet 1   losartan  (COZAAR ) 100 MG tablet Take 0.5 tablets (50 mg total) by mouth daily. 45 tablet 1   spironolactone  (ALDACTONE ) 25 MG tablet TAKE 1 TABLET BY MOUTH ONCE DAILY . APPOINTMENT REQUIRED FOR FUTURE REFILLS 90 tablet 1   umeclidinium-vilanterol (ANORO ELLIPTA ) 62.5-25 MCG/ACT AEPB Inhale 1 puff into the lungs daily. 90 each 3   No current facility-administered medications for this visit.    Allergies:   Patient has no known allergies.   Social History:  The patient  reports that he quit smoking about 13 years ago. His smoking use included cigarettes. He started smoking about 28 years ago. He has a 15 pack-year smoking history. He has never used smokeless tobacco. He reports that he does not drink alcohol and does not use drugs.   Family History:  The patient's family history includes Cancer (age of onset: 35) in his mother; Coronary artery disease in his father; Emphysema in his father.  ROS:  Please see the history of present illness.    All other systems are reviewed and otherwise negative.   PHYSICAL EXAM:  VS:  There were no vitals taken for this visit. BMI: There is no height or weight on  file to calculate BMI. Well nourished, well developed, in no acute distress HEENT: normocephalic, atraumatic Neck: no JVD, carotid bruits or masses Cardiac:  *** RRR; no significant murmurs, no rubs, or gallops Lungs: ***  CTA b/l, no wheezing, rhonchi or rales Abd: soft, nontender MS: no deformity or atrophy Ext: ***  no edema Skin: chronic sun exposure skin changes face/UE espeically,  he has a rash L kne/leg, is raised, with small papules, and a couple on his L arm. Some slight blistering in appearance, no open wounds that I see Neuro:  No gross deficits appreciated Psych: euthymic mood, full affect  *** ICD site is stable, no tethering or discomfort   EKG:  Done today and reviewed by myself shows  ***  Device interrogation done today and reviewed by myself:  *** Battery and lead measurements stable ***  02/07/2022: TTE 1. Left ventricular ejection fraction, by estimation, is 45 to 50%. The  left ventricle has mildly decreased function. The left ventricle  demonstrates regional wall motion abnormalities (see scoring  diagram/findings for description). Left ventricular  diastolic parameters were normal. The average left ventricular global  longitudinal strain is -14.6 %. The global longitudinal strain is  abnormal.   2. Right ventricular systolic function is normal. The right ventricular  size is normal. There is normal pulmonary artery systolic pressure. The  estimated right ventricular systolic pressure is 27.2 mmHg.   3. The mitral valve is grossly normal. Mild mitral valve regurgitation.   4. The aortic valve is tricuspid. Aortic valve regurgitation is not  visualized.   5. The inferior vena cava is normal in size with greater than 50%  respiratory variability, suggesting right atrial pressure of 3 mmHg.    03/14/2011: TTE Study Conclusions  - Left ventricle: There is global hypokinesis. The cavity size was    moderately dilated. Wall thickness was normal. Systolic  function    was moderately to severely reduced. The estimated ejection    fraction was in the range of 30% to 35%.  - Mitral valve: Mild regurgitation.   12/13/2010: [facility] FINAL INTERPRETATION: 1. Normal coronary anatomy. 2. Severe left ventricular dysfunction. 3. Normal right heart pressures.   Recent Labs: No results found for requested labs within last 365 days.  No results found for requested labs within last 365 days.   CrCl cannot be calculated (Patient's most recent lab result is older than the maximum 21 days allowed.).   Wt Readings from Last 3 Encounters:  11/22/23 144 lb (65.3 kg)  05/25/23 141 lb (64 kg)  11/23/22 140 lb (63.5 kg)     Other studies reviewed: Additional studies/records reviewed today include: summarized above  ASSESSMENT AND PLAN:  ICD *** intact function *** no programming changes needed  NICM Chronic CHF (systolic) LVEF  improved to 45-505 by echo 2023 *** No symptoms or exam findings of volume OL ***  NSVT Chronic amiodarone  ***   Disposition: F/u ***, sooner if needed  Current medicines are reviewed at length with the patient today.  The patient did not have any concerns regarding medicines.  [place], PA-C 01/07/2024 9:20 AM     [facility] [address] [place] [address] [phone] (office)  [phone] (fax)

## 2024-01-09 ENCOUNTER — Encounter: Payer: Self-pay | Admitting: Physician Assistant

## 2024-01-09 ENCOUNTER — Ambulatory Visit: Attending: Physician Assistant | Admitting: Physician Assistant

## 2024-01-09 VITALS — BP 124/76 | HR 57 | Ht 68.0 in | Wt 139.0 lb

## 2024-01-09 DIAGNOSIS — Z95810 Presence of automatic (implantable) cardiac defibrillator: Secondary | ICD-10-CM

## 2024-01-09 DIAGNOSIS — I5022 Chronic systolic (congestive) heart failure: Secondary | ICD-10-CM | POA: Diagnosis not present

## 2024-01-09 DIAGNOSIS — Z79899 Other long term (current) drug therapy: Secondary | ICD-10-CM

## 2024-01-09 DIAGNOSIS — I428 Other cardiomyopathies: Secondary | ICD-10-CM | POA: Diagnosis not present

## 2024-01-09 LAB — CUP PACEART INCLINIC DEVICE CHECK
Battery Remaining Longevity: 90 mo
Brady Statistic RV Percent Paced: 0.35 %
Date Time Interrogation Session: 20250618093103
HighPow Impedance: 77.625
Implantable Lead Connection Status: 753985
Implantable Lead Implant Date: 20120813
Implantable Lead Location: 753860
Implantable Lead Model: 180
Implantable Lead Serial Number: 303783
Implantable Pulse Generator Implant Date: 20230831
Lead Channel Impedance Value: 287.5 Ohm
Lead Channel Pacing Threshold Amplitude: 1.5 V
Lead Channel Pacing Threshold Amplitude: 1.5 V
Lead Channel Pacing Threshold Pulse Width: 0.8 ms
Lead Channel Pacing Threshold Pulse Width: 0.8 ms
Lead Channel Sensing Intrinsic Amplitude: 11.7 mV
Lead Channel Setting Pacing Amplitude: 3 V
Lead Channel Setting Pacing Pulse Width: 0.8 ms
Lead Channel Setting Sensing Sensitivity: 0.5 mV
Pulse Gen Serial Number: 8943728

## 2024-01-09 NOTE — Patient Instructions (Signed)
 Medication Instructions:   Your physician recommends that you continue on your current medications as directed. Please refer to the Current Medication list given to you today.   *If you need a refill on your cardiac medications before your next appointment, please call your pharmacy*   Lab Work:  PLEASE GO DOWN STAIRS  [facility]  FIRST FLOOR   ( GET OFF ELEVATORS WALK TOWARDS WAITING AREA LAB LOCATED BY PHARMACY):  CMET AND TSH  TODAY     If you have labs (blood work) drawn today and your tests are completely normal, you will receive your results only by: MyChart Message (if you have MyChart) OR A paper copy in the mail If you have any lab test that is abnormal or we need to change your treatment, we will call you to review the results.   Testing/Procedures: Your physician has requested that you have an echocardiogram. Echocardiography is a painless test that uses sound waves to create images of your heart. It provides your doctor with information about the size and shape of your heart and how well your heart's chambers and valves are working. This procedure takes approximately one hour. There are no restrictions for this procedure. Please do NOT wear cologne, perfume, aftershave, or lotions (deodorant is allowed). Please arrive 15 minutes prior to your appointment time.  Please note: We ask at that you not bring children with you during ultrasound (echo/ vascular) testing. Due to room size and safety concerns, children are not allowed in the ultrasound rooms during exams. Our front office staff cannot provide observation of children in our lobby area while testing is being conducted. An adult accompanying a patient to their appointment will only be allowed in the ultrasound room at the discretion of the ultrasound technician under special circumstances. We apologize for any inconvenience.    Follow-Up: At [facility], you and your health needs are our priority.  As part of our  continuing mission to provide you with exceptional heart care, our providers are all part of one team.  This team includes your primary Cardiologist (physician) and Advanced Practice Providers or APPs (Physician Assistants and Nurse Practitioners) who all work together to provide you with the care you need, when you need it.   Your next appointment:    4 month(s) ( CONTACT  CASSIE HALL/ ANGELINE HAMMER FOR EP SCHEDULING ISSUES )    Provider:    You may see Manya Sells, MD or one of the following Advanced Practice Providers on your designated Care Team:   Mertha Abrahams, [place]    We recommend signing up for the patient portal called MyChart.  Sign up information is provided on this After Visit Summary.  MyChart is used to connect with patients for Virtual Visits (Telemedicine).  Patients are able to view lab/test results, encounter notes, upcoming appointments, etc.  Non-urgent messages can be sent to your provider as well.   To learn more about what you can do with MyChart, go to [email or web address].   Other Instructions

## 2024-01-10 ENCOUNTER — Ambulatory Visit: Payer: Self-pay | Admitting: Physician Assistant

## 2024-01-10 LAB — COMPREHENSIVE METABOLIC PANEL WITH GFR
ALT: 20 IU/L (ref 0–44)
AST: 25 IU/L (ref 0–40)
Albumin: 4.5 g/dL (ref 3.9–4.9)
Alkaline Phosphatase: 88 IU/L (ref 44–121)
BUN/Creatinine Ratio: 15 (ref 10–24)
BUN: 16 mg/dL (ref 8–27)
Bilirubin Total: 0.4 mg/dL (ref 0.0–1.2)
CO2: 22 mmol/L (ref 20–29)
Calcium: 9.9 mg/dL (ref 8.6–10.2)
Chloride: 100 mmol/L (ref 96–106)
Creatinine, Ser: 1.08 mg/dL (ref 0.76–1.27)
Globulin, Total: 2.4 g/dL (ref 1.5–4.5)
Glucose: 85 mg/dL (ref 70–99)
Potassium: 5.1 mmol/L (ref 3.5–5.2)
Sodium: 138 mmol/L (ref 134–144)
Total Protein: 6.9 g/dL (ref 6.0–8.5)
eGFR: 78 mL/min/{1.73_m2} (ref >59)

## 2024-01-10 LAB — TSH: TSH: 1.87 u[IU]/mL (ref 0.450–4.500)

## 2024-02-18 ENCOUNTER — Ambulatory Visit: Attending: Internal Medicine

## 2024-02-18 DIAGNOSIS — I5022 Chronic systolic (congestive) heart failure: Secondary | ICD-10-CM

## 2024-02-18 DIAGNOSIS — Z95810 Presence of automatic (implantable) cardiac defibrillator: Secondary | ICD-10-CM

## 2024-02-21 ENCOUNTER — Ambulatory Visit (HOSPITAL_COMMUNITY)
Admission: RE | Admit: 2024-02-21 | Discharge: 2024-02-21 | Disposition: A | Discharge status: Home / Self Care | Source: Ambulatory Visit | Attending: Cardiology | Admitting: Cardiology

## 2024-02-21 DIAGNOSIS — I428 Other cardiomyopathies: Secondary | ICD-10-CM | POA: Insufficient documentation

## 2024-02-21 LAB — ECHOCARDIOGRAM COMPLETE
Area-P 1/2: 5.16 cm2
S' Lateral: 4.1 cm

## 2024-02-22 ENCOUNTER — Telehealth: Payer: Self-pay

## 2024-02-22 NOTE — Telephone Encounter (Signed)
Remote ICM transmission received.  Attempted call to patient regarding ICM remote transmission and left message to return call   

## 2024-02-22 NOTE — Progress Notes (Signed)
 EPIC Encounter for ICM Monitoring  Patient Name: George Barber is a 63 y.o. male Date: 02/22/2024 Primary Care Physican: Gladis Mustard, FNP Primary Cardiologist: Waddell Electrophysiologist: Waddell 09/26/2023 Weight: 142-144 lbs  11/22/2023 Office Weight: 144 lbs 12/05/2023 Weight: 143 lbs 01/09/2024 Office Weight: 139 lbs   Attempted call to patient and unable to reach.  Left message for return call.  Transmission results reviewed.          Diet:  Does not adhere to low salt diet         CorVue thoracic impedance suggesting normal fluid levels with the exception of possible fluid accumulation from 6/26-7/2 and 7/16-7/19.   Prescribed:  Furosemide  20 mg 1 tablet daily Spironolactone  25 mg take 1 tablet daily   Labs: 01/09/2024 Creatinine 1.08, BUN 16, Potassium 5.1, Sodium 138, GFR 78 A complete set of results can be found in Results Review.   Recommendations: Unable to reach.     Follow-up plan: ICM clinic phone appointment on 03/25/2024.  91 day device clinic remote transmission 04/02/2024.   EP/Cardiology Office Visits:  Recall 05/08/2024 with Charlies Arthur, PA.     Copy of ICM check sent to Dr. Waddell.   3 month ICM trend: 02/19/2024.    12-14 Month ICM trend:     Mitzie GORMAN Garner, RN 02/22/2024 7:24 AM

## 2024-02-25 NOTE — Telephone Encounter (Signed)
-----   Message from George Barber sent at 02/22/2024  1:23 PM EDT ----- Heart muscle strength remains reduced. Has been both a bit better and worse by old echos. Would like to start working on advancing his medical therapy. Please schedule to see one of our pharmacists to start advancing/adjusting his meds Needs to be referred to one of our general cardiologists please  ----- Message ----- From: Interface, [employer] Sent: 02/21/2024   2:36 PM EDT To: George Macario Arthur, PA-C

## 2024-02-25 NOTE — Telephone Encounter (Signed)
 Lvm to call back about results and recommendations

## 2024-02-26 ENCOUNTER — Telehealth: Payer: Self-pay | Admitting: Physician Assistant

## 2024-02-26 DIAGNOSIS — I5022 Chronic systolic (congestive) heart failure: Secondary | ICD-10-CM

## 2024-02-26 DIAGNOSIS — I10 Essential (primary) hypertension: Secondary | ICD-10-CM

## 2024-02-26 DIAGNOSIS — Z79899 Other long term (current) drug therapy: Secondary | ICD-10-CM

## 2024-02-26 NOTE — Telephone Encounter (Signed)
 Spoke with pt regarding his echo results. Pt aware of results. Pt agreeable to referrals. Pt aware that he will get a call to schedule appointments. Referrals placed. Pt verbalized understanding. All questions if any were answered.

## 2024-02-26 NOTE — Telephone Encounter (Signed)
Patient returned call for his echo results 

## 2024-02-27 NOTE — Progress Notes (Signed)
 Remote ICD transmission.

## 2024-03-11 ENCOUNTER — Other Ambulatory Visit (HOSPITAL_COMMUNITY): Payer: Self-pay

## 2024-03-11 ENCOUNTER — Ambulatory Visit: Attending: Cardiology | Admitting: Pharmacist

## 2024-03-11 ENCOUNTER — Telehealth: Payer: Self-pay | Admitting: Pharmacist

## 2024-03-11 ENCOUNTER — Telehealth: Payer: Self-pay | Admitting: Pharmacy Technician

## 2024-03-11 ENCOUNTER — Encounter: Payer: Self-pay | Admitting: Pharmacist

## 2024-03-11 VITALS — BP 156/88 | HR 68

## 2024-03-11 DIAGNOSIS — I5022 Chronic systolic (congestive) heart failure: Secondary | ICD-10-CM

## 2024-03-11 DIAGNOSIS — I428 Other cardiomyopathies: Secondary | ICD-10-CM | POA: Diagnosis not present

## 2024-03-11 MED ORDER — CARVEDILOL 25 MG PO TABS
25.0000 mg | ORAL_TABLET | Freq: Two times a day (BID) | ORAL | 3 refills | Status: DC
Start: 2024-03-11 — End: 2024-04-15
  Filled 2024-03-11: qty 180, 90d supply, fill #0

## 2024-03-11 MED ORDER — OMRON 3 SERIES BP MONITOR DEVI
1.0000 | Freq: Every day | 0 refills | Status: AC
  Filled 2024-03-11: qty 1, 30d supply, fill #0

## 2024-03-11 MED ORDER — EMPAGLIFLOZIN 10 MG PO TABS
10.0000 mg | ORAL_TABLET | Freq: Every day | ORAL | 11 refills | Status: DC
  Filled 2024-03-11 – 2024-03-12 (×2): qty 30, 30d supply, fill #0

## 2024-03-11 NOTE — Patient Instructions (Signed)
 Changes made by your pharmacist Robbi Blanch, PharmD at today's visit:    Instructions/Changes  (what do you need to do) Your Notes  (what you did and when you did it)  Increase carvedilol  doe to 25 mg twice daily    Lower salt intake and alcohol intake    Start taking Jardiance  10 mg daily once PA approved by your insurance and get Lab - BMP done in 2-3 weeks     Bring all of your meds, your BP cuff and your record of home blood pressures to your next appointment.    HOW TO TAKE YOUR BLOOD PRESSURE AT HOME  Rest 5 minutes before taking your blood pressure.  Don't smoke or drink caffeinated beverages for at least 30 minutes before. Take your blood pressure before (not after) you eat. Sit comfortably with your back supported and both feet on the floor (don't cross your legs). Elevate your arm to heart level on a table or a desk. Use the proper sized cuff. It should fit smoothly and snugly around your bare upper arm. There should be enough room to slip a fingertip under the cuff. The bottom edge of the cuff should be 1 inch above the crease of the elbow. Ideally, take 3 measurements at one sitting and record the average.  Important lifestyle changes to control high blood pressure  Intervention  Effect on the BP  Lose extra pounds and watch your waistline Weight loss is one of the most effective lifestyle changes for controlling blood pressure. If you're overweight or obese, losing even a small amount of weight can help reduce blood pressure. Blood pressure might go down by about 1 millimeter of mercury (mm Hg) with each kilogram (about 2.2 pounds) of weight lost.  Exercise regularly As a general goal, aim for at least 30 minutes of moderate physical activity every day. Regular physical activity can lower high blood pressure by about 5 to 8 mm Hg.  Eat a healthy diet Eating a diet rich in whole grains, fruits, vegetables, and low-fat dairy products and low in saturated fat and  cholesterol. A healthy diet can lower high blood pressure by up to 11 mm Hg.  Reduce salt (sodium) in your diet Even a small reduction of sodium in the diet can improve heart health and reduce high blood pressure by about 5 to 6 mm Hg.  Limit alcohol One drink equals 12 ounces of beer, 5 ounces of wine, or 1.5 ounces of 80-proof liquor.  Limiting alcohol to less than one drink a day for women or two drinks a day for men can help lower blood pressure by about 4 mm Hg.   If you have any questions or concerns please use My Chart to send questions or call the office at [phone]

## 2024-03-11 NOTE — Assessment & Plan Note (Signed)
 Assessment: BP is uncontrolled in office BP 147/86 mmHg 2nd reading 156/88 heart rate 65 (goal <130/80). Currently on beta-blocker, ARB, and MRA - tolerate them well takes Lasix  20 mg daily  Able to perform ADLs without HF symptoms Activity level very active. he  checks her weight at home (normal range 140 - 145 lbs). Denies  LEE, PND,or orthopnea  Denies SOB, palpitation, chest pain, headaches,or swelling Reiterated the importance of regular exercise and low salt diet   Plan:  Start taking Jardiance  10 mg daily increase carvedilol  dose from 12.5 mg twice daily to 25 mg twice daily  Continue taking losartan  50 mg daily Lasix  20 mg daily, spironolactone  25 mg daily  Patient to keep record of BP readings with heart rate and report to us  at the next visit Patient to see PharmD in 4 weeks for follow up  Follow up lab(s): BMP in 3 weeks of starting Jardiance 

## 2024-03-11 NOTE — Telephone Encounter (Signed)
 Called plan- does not require PA. Patient must call [phone] in order to get the medication

## 2024-03-11 NOTE — Progress Notes (Signed)
 Patient ID: George Barber                 DOB: August 02, 1960                      MRN: [id number]     HPI: George Barber is a 63 y.o. male referred by Jenna Feeling, PA-C to pharmacy clinic for HF medication management. PMH is significant for CHF, HTN, COPD, EtOH abuse, ED, ICD in place. Most recent LVEF 40-45% on 02/21/24.   The patient presented today for heart failure medication optimization. He reports taking all prescribed heart failure medications without any issues. During the visit, we discussed the indications for his cardiac medications, provided an introduction to the GDMT (guideline-directed medical therapy) clinic, and reviewed the rationale for medication titration, the importance of adherence, and the value of active patient engagement in his care. He states that he is feeling well and denies any dizziness, lightheadedness, fatigue, chest pain, or palpitations. He reports mild shortness of breath only with significant exertion but otherwise has no baseline dyspnea. He is able to perform all activities of daily living and describes his activity level as very active. He monitors his weight at home, which has remained stable in the 140-145 lb range. He denies lower extremity edema, paroxysmal nocturnal dyspnea (PND), or orthopnea. His appetite remains normal. He stays busy with house and yard work and helps care for his grandchildren.    Current CHF meds: Carvedilol  12.5 mg twice daily and losartan  50 mg daily Lasix  20 mg daily, spironolactone  25 mg daily  Previously tried: none  Last lab SrCr. 1.08 and Cr Cl 63 mL/min  Adherence Assessment  Do you ever forget to take your medication? [] Yes [x] No  Do you ever skip doses due to side effects? [] Yes [x] No  Do you have trouble affording your medicines? [] Yes [x] No  Are you ever unable to pick up your medication due to transportation difficulties? [] Yes [x] No  Do you ever stop taking your medications because you don't believe they are  helping? [] Yes [x] No  Do you check your weight daily? 140-145 lbs  [x] Yes [] No   Adherence strategy: just remember them - provided to pill box   Barriers to obtaining medications: none   BP goal: <130/80   Family History:  Relation Problem Comments  Mother (Deceased) Cancer (Age: 40)     Father  Coronary artery disease unknown  Emphysema      Social History:  1 beer per day  Smoking: quit since 2012  Diet:  healthy home cooked meals, likes salty snacks   Exercise: stays active around the house but does not have structured exercise schedule   Home BP readings: none - does not checks at home   Wt Readings from Last 3 Encounters:  01/09/24 139 lb (63 kg)  11/22/23 144 lb (65.3 kg)  05/25/23 141 lb (64 kg)   BP Readings from Last 3 Encounters:  03/11/24 (!) 156/88  01/09/24 124/76  11/22/23 (!) 144/83   Pulse Readings from Last 3 Encounters:  03/11/24 68  01/09/24 (!) 57  11/22/23 63    Renal function: CrCl cannot be calculated (Patient's most recent lab result is older than the maximum 21 days allowed.).  Past Medical History:  Diagnosis Date   Elevated LFTs    Emphysema    Fluttering heart    NICM (nonischemic cardiomyopathy) (HCC)    cath 12/13/10: Normal cors, EF 10-15%;  b. echo 5/12 EF 15%,  mild MR, mod LAE, mild RVE, mild to mod RAE, mild to mod TR, PASP 44   NSVT (nonsustained ventricular tachycardia) (HCC)    Life Vest; amiodarone  rx   Systolic CHF, chronic (HCC)     Current Outpatient Medications on File Prior to Visit  Medication Sig Dispense Refill   albuterol  (VENTOLIN  HFA) 108 (90 Base) MCG/ACT inhaler INHALE 2 PUFFS BY MOUTH EVERY 6 HOURS AS NEEDED FOR WHEEZING AND FOR SHORTNESS OF BREATH . 18 g 3   amiodarone  (PACERONE ) 200 MG tablet Take 1 tablet (200 mg total) by mouth daily. 90 tablet 1   aspirin 81 MG tablet Take 81 mg by mouth daily.       Fluticasone-Umeclidin-Vilant (TRELEGY ELLIPTA ) 100-62.5-25 MCG/ACT AEPB Inhale 1 puff into the  lungs daily. 1 each 11   furosemide  (LASIX ) 20 MG tablet TAKE 1 TABLET BY MOUTH ONCE DAILY . APPOINTMENT REQUIRED FOR FUTURE REFILLS 90 tablet 1   losartan  (COZAAR ) 100 MG tablet Take 0.5 tablets (50 mg total) by mouth daily. 45 tablet 1   spironolactone  (ALDACTONE ) 25 MG tablet TAKE 1 TABLET BY MOUTH ONCE DAILY . APPOINTMENT REQUIRED FOR FUTURE REFILLS 90 tablet 1   umeclidinium-vilanterol (ANORO ELLIPTA ) 62.5-25 MCG/ACT AEPB Inhale 1 puff into the lungs daily. 90 each 3   No current facility-administered medications on file prior to visit.    No Known Allergies   Assessment/Plan:  1. CHF -  Chronic systolic heart failure Assessment: BP is uncontrolled in office BP 147/86 mmHg 2nd reading 156/88 heart rate 65 (goal <130/80). Currently on beta-blocker, ARB, and MRA - tolerate them well takes Lasix  20 mg daily  Able to perform ADLs without HF symptoms Activity level very active. he  checks her weight at home (normal range 140 - 145 lbs). Denies  LEE, PND,or orthopnea  Denies SOB, palpitation, chest pain, headaches,or swelling Reiterated the importance of regular exercise and low salt diet   Plan:  Start taking Jardiance  10 mg daily increase carvedilol  dose from 12.5 mg twice daily to 25 mg twice daily  Continue taking losartan  50 mg daily Lasix  20 mg daily, spironolactone  25 mg daily  Patient to keep record of BP readings with heart rate and report to us  at the next visit Patient to see PharmD in 4 weeks for follow up  Follow up lab(s): BMP in 3 weeks of starting Jardiance    NICM (nonischemic cardiomyopathy) (HCC) -     Basic metabolic panel with GFR  Chronic systolic heart failure (HCC) Assessment & Plan: Assessment: BP is uncontrolled in office BP 147/86 mmHg 2nd reading 156/88 heart rate 65 (goal <130/80). Currently on beta-blocker, ARB, and MRA - tolerate them well takes Lasix  20 mg daily  Able to perform ADLs without HF symptoms Activity level very active. he  checks her  weight at home (normal range 140 - 145 lbs). Denies  LEE, PND,or orthopnea  Denies SOB, palpitation, chest pain, headaches,or swelling Reiterated the importance of regular exercise and low salt diet   Plan:  Start taking Jardiance  10 mg daily increase carvedilol  dose from 12.5 mg twice daily to 25 mg twice daily  Continue taking losartan  50 mg daily Lasix  20 mg daily, spironolactone  25 mg daily  Patient to keep record of BP readings with heart rate and report to us  at the next visit Patient to see PharmD in 4 weeks for follow up  Follow up lab(s): BMP in 3 weeks of starting Jardiance      Other orders -  Carvedilol ; Take 1 tablet (25 mg total) by mouth 2 (two) times daily.  Dispense: 180 tablet; Refill: 3 -     Empagliflozin ; Take 1 tablet (10 mg total) by mouth daily before breakfast.  Dispense: 30 tablet; Refill: 11 -     Omron 3 Series BP Monitor; Use to measure blood pressure as directed.  Dispense: 1 each; Refill: 0       Thank you   Robbi Blanch, Pharm.D [facility]. [facility] [address], [place], [place] [address] Phone: [phone]; Fax: [phone]

## 2024-03-11 NOTE — Telephone Encounter (Signed)
 Pharmacy Patient Advocate Encounter   Received notification from Patient Pharmacy that prior authorization for Jardiance  10 MG is required/requested.   Insurance verification completed.   The patient is insured through [employer] .   Per test claim: PA required; PA submitted to above mentioned insurance via Prompt PA Key/confirmation #/EOC [id number] Status is pending

## 2024-03-11 NOTE — Telephone Encounter (Signed)
PA request has been Submitted.

## 2024-03-12 ENCOUNTER — Other Ambulatory Visit (HOSPITAL_COMMUNITY): Payer: Self-pay

## 2024-03-13 ENCOUNTER — Other Ambulatory Visit (HOSPITAL_COMMUNITY): Payer: Self-pay

## 2024-03-25 ENCOUNTER — Ambulatory Visit: Attending: Internal Medicine

## 2024-03-25 DIAGNOSIS — Z95810 Presence of automatic (implantable) cardiac defibrillator: Secondary | ICD-10-CM

## 2024-03-25 DIAGNOSIS — I5022 Chronic systolic (congestive) heart failure: Secondary | ICD-10-CM

## 2024-03-26 ENCOUNTER — Telehealth: Payer: Self-pay

## 2024-03-26 NOTE — Progress Notes (Signed)
 EPIC Encounter for ICM Monitoring  Patient Name: George Barber is a 63 y.o. male Date: 03/26/2024 Primary Care Physican: Gladis Mustard, FNP Primary Cardiologist: Waddell Electrophysiologist: Waddell 09/26/2023 Weight: 142-144 lbs  11/22/2023 Office Weight: 144 lbs 12/05/2023 Weight: 143 lbs 01/09/2024 Office Weight: 139 lbs   Attempted call to patient and unable to reach.  Left message for return call.  Transmission results reviewed.          Diet:  Does not adhere to low salt diet         CorVue thoracic impedance suggesting possible dryness starting 8/30 but starting to trend back toward baseline.   Prescribed:  Furosemide  20 mg 1 tablet daily Spironolactone  25 mg take 1 tablet daily   Labs: 01/09/2024 Creatinine 1.08, BUN 16, Potassium 5.1, Sodium 138, GFR 78 A complete set of results can be found in Results Review.   Recommendations: Unable to reach.     Follow-up plan: ICM clinic phone appointment on 04/02/2024 to recheck fluid levels.  91 day device clinic remote transmission 04/02/2024.   EP/Cardiology Office Visits:  Recall 05/08/2024 with Charlies Arthur, PA.     Copy of ICM check sent to Dr. Waddell.   3 month ICM trend: 03/25/2024.    12-14 Month ICM trend:     Mitzie GORMAN Garner, RN 03/26/2024 3:42 PM

## 2024-03-26 NOTE — Telephone Encounter (Signed)
 Remote ICM transmission received.  Attempted call to patient regarding ICM remote transmission and left message to call back

## 2024-03-27 NOTE — Progress Notes (Signed)
 Spoke with patient and heart failure questions reviewed.  Transmission results reviewed.  Pt asymptomatic for fluid accumulation or dehydration.  Reports feeling well at this time and voices no complaints.  He was started on Jardiance  about 10 days ago and advised it is possible jardiance  may have a diuretic effect.  Advised to drink 64 oz fluid daily and will check fluid levels next week.  Encouraged to drink 64 oz fluid daily to stay hydrated.

## 2024-04-02 ENCOUNTER — Ambulatory Visit (INDEPENDENT_AMBULATORY_CARE_PROVIDER_SITE_OTHER): Payer: Commercial Managed Care - PPO

## 2024-04-02 ENCOUNTER — Telehealth: Payer: Self-pay

## 2024-04-02 ENCOUNTER — Ambulatory Visit: Attending: Internal Medicine

## 2024-04-02 DIAGNOSIS — I472 Ventricular tachycardia, unspecified: Secondary | ICD-10-CM | POA: Diagnosis not present

## 2024-04-02 DIAGNOSIS — Z95810 Presence of automatic (implantable) cardiac defibrillator: Secondary | ICD-10-CM

## 2024-04-02 DIAGNOSIS — I5022 Chronic systolic (congestive) heart failure: Secondary | ICD-10-CM

## 2024-04-02 NOTE — Progress Notes (Signed)
 EPIC Encounter for ICM Monitoring  Patient Name: George Barber is a 63 y.o. male Date: 04/02/2024 Primary Care Physican: Gladis Mustard, FNP Primary Cardiologist: Waddell Electrophysiologist: Waddell 09/26/2023 Weight: 142-144 lbs  11/22/2023 Office Weight: 144 lbs 12/05/2023 Weight: 143 lbs 01/09/2024 Office Weight: 139 lbs   Attempted call to patient and unable to reach.  Left message for return call.  Transmission results reviewed.           Diet:  Does not adhere to low salt diet         CorVue thoracic impedance suggesting fluid levels returned to normal.   Prescribed:  Furosemide  20 mg 1 tablet daily Spironolactone  25 mg take 1 tablet daily   Labs: 01/09/2024 Creatinine 1.08, BUN 16, Potassium 5.1, Sodium 138, GFR 78 A complete set of results can be found in Results Review.   Recommendations:  Unable to reach.     Follow-up plan: ICM clinic phone appointment on 05/05/2024.  91 day device clinic remote transmission 04/02/2024.   EP/Cardiology Office Visits:  Recall 05/08/2024 with Charlies Arthur, PA.     Copy of ICM check sent to Dr. Waddell.    3 month ICM trend: 04/02/2024.    12-14 Month ICM trend:     Mitzie GORMAN Garner, RN 04/02/2024 7:51 AM

## 2024-04-02 NOTE — Telephone Encounter (Signed)
 Remote ICM transmission received.  Attempted call to patient regarding ICM remote transmission.  Left detailed message per DPR with ICM phone number to return call for any questions, concerns or fluid symptoms.

## 2024-04-03 LAB — CUP PACEART REMOTE DEVICE CHECK
Battery Remaining Longevity: 76 mo
Battery Remaining Percentage: 81 %
Battery Voltage: 3.04 V
Brady Statistic RV Percent Paced: 1 %
Date Time Interrogation Session: 20250910032105
HighPow Impedance: 75 Ohm
HighPow Impedance: 75 Ohm
Implantable Lead Connection Status: 753985
Implantable Lead Implant Date: 20120813
Implantable Lead Location: 753860
Implantable Lead Model: 180
Implantable Lead Serial Number: 303783
Implantable Pulse Generator Implant Date: 20230831
Lead Channel Impedance Value: 290 Ohm
Lead Channel Pacing Threshold Amplitude: 1.5 V
Lead Channel Pacing Threshold Pulse Width: 0.8 ms
Lead Channel Sensing Intrinsic Amplitude: 10.4 mV
Lead Channel Setting Pacing Amplitude: 3 V
Lead Channel Setting Pacing Pulse Width: 0.8 ms
Lead Channel Setting Sensing Sensitivity: 0.5 mV
Pulse Gen Serial Number: 8943728

## 2024-04-04 NOTE — Progress Notes (Signed)
Remote ICD Transmission.

## 2024-04-11 ENCOUNTER — Ambulatory Visit: Payer: Self-pay | Admitting: Internal Medicine

## 2024-04-15 ENCOUNTER — Encounter: Payer: Self-pay | Admitting: Pharmacist

## 2024-04-15 ENCOUNTER — Other Ambulatory Visit: Payer: Self-pay

## 2024-04-15 ENCOUNTER — Ambulatory Visit: Attending: Cardiology | Admitting: Pharmacist

## 2024-04-15 VITALS — BP 143/70 | HR 57

## 2024-04-15 DIAGNOSIS — I5022 Chronic systolic (congestive) heart failure: Secondary | ICD-10-CM

## 2024-04-15 MED ORDER — CARVEDILOL 25 MG PO TABS: 25.0000 mg | ORAL_TABLET | Freq: Two times a day (BID) | ORAL | Status: AC

## 2024-04-15 MED ORDER — EMPAGLIFLOZIN 10 MG PO TABS: 10.0000 mg | ORAL_TABLET | Freq: Every day | ORAL | 0 refills | Status: DC

## 2024-04-15 NOTE — Progress Notes (Signed)
 Patient ID: [name]                 DOB: March 09, 1961                      MRN: [id number]     HPI: George Barber is a 63 y.o. male referred by George Feeling, PA-C to pharmacy clinic for HF medication management. PMH is significant for CHF, HTN, COPD, EtOH abuse, ED, ICD in place. Most recent LVEF 40-45% on 02/21/24.  At last visit patient was symptoms free. Carvedilol  dose were increased from 12.5 mg twice daily to 25 mg twice daily. Also jardiance  were added to other GDMT. Patient were advised to keep log of BP readings.  The patient presented today for follow up. Brought in home BP log, home BP ~ 125/77. Reports he feels tired but nothing more than usual. He received Jardiance  1st fill from down stairs pharmacy but then his insurance advised him to get the next fill at [facility] which [place] based pharmacy. Called [facility] they will be sending prescription form for us  to fill out. denies any dizziness, lightheadedness, fatigue, chest pain, or palpitations. He is able to perform all activities of daily living . He monitors his weight at home. He denies lower extremity edema, paroxysmal nocturnal dyspnea (PND), or orthopnea. His appetite remains normal.    Current CHF meds: Carvedilol  25 mg twice daily and losartan  50 mg daily Lasix  20 mg daily PRN , spironolactone  25 mg daily, Jardiance  10 mg daily  Previously tried: none  Last lab SrCr. 1.08 and Cr Cl 63 mL/min  Adherence Assessment  Do you ever forget to take your medication? [] Yes [x] No  Do you ever skip doses due to side effects? [] Yes [x] No  Do you have trouble affording your medicines? [] Yes [x] No  Are you ever unable to pick up your medication due to transportation difficulties? [] Yes [x] No  Do you ever stop taking your medications because you don't believe they are helping? [] Yes [x] No  Do you check your weight daily? 140-145 lbs  [x] Yes [] No   Adherence strategy: just remember them - provided to pill box    Barriers to obtaining medications: none   BP goal: <130/80   Family History:  Relation Problem Comments  Mother (Deceased) Cancer (Age: 29)     Father  Coronary artery disease unknown  Emphysema      Social History:  1 beer per day  Smoking: quit since 2012  Diet:  healthy home cooked meals, likes salty snacks   Exercise: stays active around the house but does not have structured exercise schedule   Home BP readings: none - does not checks at home   Wt Readings from Last 3 Encounters:  01/09/24 139 lb (63 kg)  11/22/23 144 lb (65.3 kg)  05/25/23 141 lb (64 kg)   BP Readings from Last 3 Encounters:  04/15/24 (!) 143/70  03/11/24 (!) 156/88  01/09/24 124/76   Pulse Readings from Last 3 Encounters:  04/15/24 (!) 57  03/11/24 68  01/09/24 (!) 57    Renal function: CrCl cannot be calculated (Patient's most recent lab result is older than the maximum 21 days allowed.).  Past Medical History:  Diagnosis Date   Elevated LFTs    Emphysema    Fluttering heart    NICM (nonischemic cardiomyopathy) (HCC)    cath 12/13/10: Normal cors, EF 10-15%;  b. echo 5/12 EF 15%, mild MR, mod LAE, mild  RVE, mild to mod RAE, mild to mod TR, PASP 44   NSVT (nonsustained ventricular tachycardia) (HCC)    Life Vest; amiodarone  rx   Systolic CHF, chronic (HCC)     Current Outpatient Medications on File Prior to Visit  Medication Sig Dispense Refill   albuterol  (VENTOLIN  HFA) 108 (90 Base) MCG/ACT inhaler INHALE 2 PUFFS BY MOUTH EVERY 6 HOURS AS NEEDED FOR WHEEZING AND FOR SHORTNESS OF BREATH . 18 g 3   amiodarone  (PACERONE ) 200 MG tablet Take 1 tablet (200 mg total) by mouth daily. 90 tablet 1   aspirin 81 MG tablet Take 81 mg by mouth daily.       Blood Pressure Monitoring (OMRON 3 SERIES BP MONITOR) DEVI Use to measure blood pressure as directed. 1 each 0   empagliflozin  (JARDIANCE ) 10 MG TABS tablet Take 1 tablet (10 mg total) by mouth daily before breakfast. 30 tablet 11    furosemide  (LASIX ) 20 MG tablet TAKE 1 TABLET BY MOUTH ONCE DAILY . APPOINTMENT REQUIRED FOR FUTURE REFILLS 90 tablet 1   losartan  (COZAAR ) 100 MG tablet Take 0.5 tablets (50 mg total) by mouth daily. 45 tablet 1   spironolactone  (ALDACTONE ) 25 MG tablet TAKE 1 TABLET BY MOUTH ONCE DAILY . APPOINTMENT REQUIRED FOR FUTURE REFILLS 90 tablet 1   umeclidinium-vilanterol (ANORO ELLIPTA ) 62.5-25 MCG/ACT AEPB Inhale 1 puff into the lungs daily. 90 each 3   No current facility-administered medications on file prior to visit.    No Known Allergies   Assessment/Plan:  1. CHF -  Chronic systolic heart failure (HCC) Assessment: BP is uncontrolled in office BP 141/77 mmHg 2nd reading 143/70 heart rate 58 (goal <130/80) however home BP ~ 125/77 on new BP machine  Currently on beta-blocker, ARB, SGLT2i and MRA - tolerate them well takes Lasix  20 mg daily  Able to perform ADLs without HF symptoms Activity level very active. Denies  LEE, PND,or orthopnea  Denies SOB, palpitation, chest pain, headaches,or swelling   Plan:  Switch lasix  20 mg daily to prn for swelling will get BMP checked today post Jardiance  start  Continue taking losartan  50 mg daily carvedilol  25 mg twice daily, spironolactone  25 mg daily  Patient to keep record of BP readings with heart rate and report to us  at the next visit Patient to see PharmD in 4 weeks for follow up  Follow up lab(s): BMP today if WNL can switch ARB to ARNI   Chronic systolic heart failure (HCC) Overview: Current CHF meds: Carvedilol  25 mg twice daily and losartan  50 mg daily Lasix  20 mg daily PRN , spironolactone  25 mg daily, Jardiance  10 mg daily  Previously tried: none  Last lab SrCr. 1.08 and Cr Cl 63 mL/min  Adherence Assessment  Do you ever forget to take your medication? [] Yes [x] No  Do you ever skip doses due to side effects? [] Yes [x] No  Do you have trouble affording your medicines? [] Yes [x] No  Are you ever unable to pick up your medication  due to transportation difficulties? [] Yes [x] No  Do you ever stop taking your medications because you don't believe they are helping? [] Yes [x] No  Do you check your weight daily? 140-145 lbs  [x] Yes [] No   Adherence strategy: just remember them - provided to pill box   Barriers to obtaining medications: none   Assessment & Plan: Assessment: BP is uncontrolled in office BP 141/77 mmHg 2nd reading 143/70 heart rate 58 (goal <130/80) however home BP ~ 125/77 on new BP machine  Currently on beta-blocker, ARB, SGLT2i and MRA - tolerate them well takes Lasix  20 mg daily  Able to perform ADLs without HF symptoms Activity level very active. Denies  LEE, PND,or orthopnea  Denies SOB, palpitation, chest pain, headaches,or swelling   Plan:  Switch lasix  20 mg daily to prn for swelling will get BMP checked today post Jardiance  start  Continue taking losartan  50 mg daily carvedilol  25 mg twice daily, spironolactone  25 mg daily  Patient to keep record of BP readings with heart rate and report to us  at the next visit Patient to see PharmD in 4 weeks for follow up  Follow up lab(s): BMP today if WNL can switch ARB to ARNI     Other orders -     Carvedilol ; Take 1 tablet (25 mg total) by mouth 2 (two) times daily.    Thank you   Robbi Blanch, Pharm.D [facility] [address], [place], [place] [address] Phone: [phone]; Fax: [phone]

## 2024-04-15 NOTE — Assessment & Plan Note (Signed)
 Assessment: BP is uncontrolled in office BP 141/77 mmHg 2nd reading 143/70 heart rate 58 (goal <130/80) however home BP ~ 125/77 on new BP machine  Currently on beta-blocker, ARB, SGLT2i and MRA - tolerate them well takes Lasix  20 mg daily  Able to perform ADLs without HF symptoms Activity level very active. Denies  LEE, PND,or orthopnea  Denies SOB, palpitation, chest pain, headaches,or swelling   Plan:  Switch lasix  20 mg daily to prn for swelling will get BMP checked today post Jardiance  start  Continue taking losartan  50 mg daily carvedilol  25 mg twice daily, spironolactone  25 mg daily  Patient to keep record of BP readings with heart rate and report to us  at the next visit Patient to see PharmD in 4 weeks for follow up  Follow up lab(s): BMP today if WNL can switch ARB to ARNI

## 2024-04-15 NOTE — Patient Instructions (Signed)
 Changes made by your pharmacist Robbi Blanch, PharmD at today's visit:    Instructions/Changes  (what do you need to do) Your Notes  (what you did and when you did it)  Take furosemide  only when needed for swelling instead of every day    Continue taking carvedilol  25 mg twice daily, Jardiance  10 mg daily spironolactone  25 mg daily and losartan  50 mg daily    Continue following low salt diet     Bring all of your meds, your BP cuff and your record of home blood pressures to your next appointment.    HOW TO TAKE YOUR BLOOD PRESSURE AT HOME  Rest 5 minutes before taking your blood pressure.  Don't smoke or drink caffeinated beverages for at least 30 minutes before. Take your blood pressure before (not after) you eat. Sit comfortably with your back supported and both feet on the floor (don't cross your legs). Elevate your arm to heart level on a table or a desk. Use the proper sized cuff. It should fit smoothly and snugly around your bare upper arm. There should be enough room to slip a fingertip under the cuff. The bottom edge of the cuff should be 1 inch above the crease of the elbow. Ideally, take 3 measurements at one sitting and record the average.  Important lifestyle changes to control high blood pressure  Intervention  Effect on the BP  Lose extra pounds and watch your waistline Weight loss is one of the most effective lifestyle changes for controlling blood pressure. If you're overweight or obese, losing even a small amount of weight can help reduce blood pressure. Blood pressure might go down by about 1 millimeter of mercury (mm Hg) with each kilogram (about 2.2 pounds) of weight lost.  Exercise regularly As a general goal, aim for at least 30 minutes of moderate physical activity every day. Regular physical activity can lower high blood pressure by about 5 to 8 mm Hg.  Eat a healthy diet Eating a diet rich in whole grains, fruits, vegetables, and low-fat dairy products and  low in saturated fat and cholesterol. A healthy diet can lower high blood pressure by up to 11 mm Hg.  Reduce salt (sodium) in your diet Even a small reduction of sodium in the diet can improve heart health and reduce high blood pressure by about 5 to 6 mm Hg.  Limit alcohol One drink equals 12 ounces of beer, 5 ounces of wine, or 1.5 ounces of 80-proof liquor.  Limiting alcohol to less than one drink a day for women or two drinks a day for men can help lower blood pressure by about 4 mm Hg.   If you have any questions or concerns please use My Chart to send questions or call the office at [phone]

## 2024-04-16 ENCOUNTER — Ambulatory Visit: Payer: Self-pay | Admitting: Pharmacist

## 2024-04-16 DIAGNOSIS — I5022 Chronic systolic (congestive) heart failure: Secondary | ICD-10-CM

## 2024-04-16 LAB — BASIC METABOLIC PANEL WITH GFR
BUN/Creatinine Ratio: 13 (ref 10–24)
BUN: 13 mg/dL (ref 8–27)
CO2: 21 mmol/L (ref 20–29)
Calcium: 10 mg/dL (ref 8.6–10.2)
Chloride: 101 mmol/L (ref 96–106)
Creatinine, Ser: 1 mg/dL (ref 0.76–1.27)
Glucose: 78 mg/dL (ref 70–99)
Potassium: 5.4 mmol/L — ABNORMAL HIGH (ref 3.5–5.2)
Sodium: 139 mmol/L (ref 134–144)
eGFR: 85 mL/min/1.73 (ref >59)

## 2024-04-16 NOTE — Telephone Encounter (Signed)
 BMP results discussed over the phone. Advised to cut back on potassium rich food such as Bananas, Oranges, Apricots, Raisins, Dates, Cantaloupe, and Honeydew melon, Potatoes (especially baked or boiled), Sweet potatoes, Tomatoes, Spinach, Kale, Collard greens, Brussels sprouts, and Winter squash. Will repeat lab in 1 week.

## 2024-04-21 ENCOUNTER — Encounter

## 2024-05-05 ENCOUNTER — Ambulatory Visit: Attending: Internal Medicine

## 2024-05-05 DIAGNOSIS — I5022 Chronic systolic (congestive) heart failure: Secondary | ICD-10-CM

## 2024-05-05 DIAGNOSIS — Z95810 Presence of automatic (implantable) cardiac defibrillator: Secondary | ICD-10-CM

## 2024-05-06 ENCOUNTER — Telehealth: Payer: Self-pay | Admitting: Internal Medicine

## 2024-05-06 MED ORDER — EMPAGLIFLOZIN 10 MG PO TABS: 10.0000 mg | ORAL_TABLET | Freq: Every day | ORAL | 2 refills | Status: DC

## 2024-05-06 NOTE — Telephone Encounter (Signed)
*  STAT* If patient is at the pharmacy, call can be transferred to refill team.   1. Which medications need to be refilled? (please list name of each medication and dose if known)  empagliflozin  (JARDIANCE ) 10 MG TABS tablet   2. Which pharmacy/location (including street and city if local pharmacy) is medication to be sent to? [facility]  phone#: [phone]  fax#: [phone]  3. Do they need a 30 day or 90 day supply?  90 day supply

## 2024-05-06 NOTE — Telephone Encounter (Signed)
 RX sent in

## 2024-05-07 ENCOUNTER — Telehealth: Payer: Self-pay

## 2024-05-07 NOTE — Progress Notes (Signed)
 EPIC Encounter for ICM Monitoring  Patient Name: George Barber is a 63 y.o. male Date: 05/07/2024 Primary Care Physican: Gladis Mustard, FNP Primary Cardiologist: Waddell Electrophysiologist: Waddell 09/26/2023 Weight: 142-144 lbs  11/22/2023 Office Weight: 144 lbs 12/05/2023 Weight: 143 lbs 01/09/2024 Office Weight: 139 lbs   Attempted call to patient and unable to reach.  Left message for return call.  Transmission results reviewed.          Diet:  Does not adhere to low salt diet         Since 04/02/2024 ICM remote transmission: CorVue thoracic impedance suggesting normal fluid levels with the exception of possible fluid accumulation from 04/01/2024-04/08/2024.   Prescribed:  Furosemide  20 mg 1 tablet daily Spironolactone  25 mg take 1 tablet daily   Labs: 04/15/2024 Creatinine 1.00, BUN 13, Potassium 5.4, Sodium 139, GFR 85 01/09/2024 Creatinine 1.08, BUN 16, Potassium 5.1, Sodium 138, GFR 78 A complete set of results can be found in Results Review.   Recommendations:  Unable to reach.      Follow-up plan: ICM clinic phone appointment on 06/09/2024.  91 day device clinic remote transmission 07/02/2024.   EP/Cardiology Office Visits: 05/14/2024 with Dr Swaziland.   Recall 05/08/2024 with Charlies Arthur, PA.     Copy of ICM check sent to Dr. Waddell.    Remote monitoring is medically necessary for Heart Failure Management.    Daily Thoracic Impedance ICM trend: 02/08/2024 through 05/03/2024.     Mitzie GORMAN Garner, RN 05/07/2024 8:46 AM

## 2024-05-07 NOTE — Telephone Encounter (Signed)
Remote ICM transmission received.  Attempted call to patient regarding ICM remote transmission and left message to return call   

## 2024-05-10 NOTE — Progress Notes (Unsigned)
 Cardiology Office Note:    Date:  05/14/2024   ID:  George Barber, DOB [birth year], MRN [id number]  PCP:  Gladis Mustard, FNP   [facility] Providers Cardiologist:  None Electrophysiologist:  Danelle Birmingham, MD     Referring MD: Leverne Charlies Helling, PA-C   Chief Complaint  Patient presents with   Congestive Heart Failure    History of Present Illness:    George Barber is a 63 y.o. male seen for evaluation and management of chronic systolic CHF. He had diagnosis of nonischemic cardiomyopathy in 2012 with EF down to 15%. Cath without significant CAD. Had NSVT manage with amiodarone  and ICD. Has been followed by our EP service. Has not seen general cardiology in many years- last saw Dr Lavona in 2012. His last Echo in July 2025 showed EF of 40-45%. It was felt that he would benefit from optimization of GDMT. Last ICD generator change out was in Aug 2023.   He reports he feels well. No dyspnea or chest pain. No palpitations. No edema or change in weight. He still works as Personnel officer. Walks his dogs daily. No issues with medication  Past Medical History:  Diagnosis Date   Elevated LFTs    Emphysema    Fluttering heart    NICM (nonischemic cardiomyopathy) (HCC)    cath 12/13/10: Normal cors, EF 10-15%;  b. echo 5/12 EF 15%, mild MR, mod LAE, mild RVE, mild to mod RAE, mild to mod TR, PASP 44   NSVT (nonsustained ventricular tachycardia) (HCC)    Life Vest; amiodarone  rx   Systolic CHF, chronic (HCC)     Past Surgical History:  Procedure Laterality Date   2D ECHOCARDIOGRAM  02/2011   CARDIAC CATHETERIZATION     ICD GENERATOR CHANGEOUT N/A 03/23/2022   Procedure: ICD GENERATOR CHANGEOUT;  Surgeon: Birmingham Danelle ORN, MD;  Location: [facility] INVASIVE CV LAB;  Service: Cardiovascular;  Laterality: N/A;    Current Medications: Current Meds  Medication Sig   albuterol  (VENTOLIN  HFA) 108 (90 Base) MCG/ACT inhaler INHALE 2 PUFFS BY MOUTH EVERY 6 HOURS AS NEEDED FOR WHEEZING  AND FOR SHORTNESS OF BREATH .   aspirin 81 MG tablet Take 81 mg by mouth daily.     Blood Pressure Monitoring (OMRON 3 SERIES BP MONITOR) DEVI Use to measure blood pressure as directed.   carvedilol  (COREG ) 25 MG tablet Take 1 tablet (25 mg total) by mouth 2 (two) times daily.   empagliflozin  (JARDIANCE ) 10 MG TABS tablet Take 1 tablet (10 mg total) by mouth daily before breakfast.   sacubitril-valsartan (ENTRESTO) 49-51 MG Take 1 tablet by mouth 2 (two) times daily.   umeclidinium-vilanterol (ANORO ELLIPTA ) 62.5-25 MCG/ACT AEPB Inhale 1 puff into the lungs daily.   [DISCONTINUED] amiodarone  (PACERONE ) 200 MG tablet Take 1 tablet (200 mg total) by mouth daily.   [DISCONTINUED] furosemide  (LASIX ) 20 MG tablet TAKE 1 TABLET BY MOUTH ONCE DAILY . APPOINTMENT REQUIRED FOR FUTURE REFILLS   [DISCONTINUED] losartan  (COZAAR ) 100 MG tablet Take 0.5 tablets (50 mg total) by mouth daily.   [DISCONTINUED] spironolactone  (ALDACTONE ) 25 MG tablet TAKE 1 TABLET BY MOUTH ONCE DAILY . APPOINTMENT REQUIRED FOR FUTURE REFILLS     Allergies:   Patient has no known allergies.   Social History   Socioeconomic History   Marital status: Married    Spouse name: Not on file   Number of children: 1   Years of education: Not on file   Highest education level: Not on file  Occupational History   Occupation: ELECTRICAL    Employer: [employer]  Tobacco Use   Smoking status: Former    Current packs/day: 0.00    Average packs/day: 1 pack/day for 15.0 years (15.0 ttl pk-yrs)    Types: Cigarettes    Start date: 10/23/1995    Quit date: 10/23/2010    Years since quitting: 13.5   Smokeless tobacco: Never  Substance and Sexual Activity   Alcohol use: No   Drug use: No   Sexual activity: Not on file  Other Topics Concern   Not on file  Social History Narrative    [name] is married and lives with his wife.  They have     1 child together.  He is an Personnel officer.  He smoked for 20 years and     quit 1 month  ago.  He quit alcohol 1 month ago, after a 20-year history     of what he describes as social alcohol, but does endorse drinking 6-8     beers per day.         Social Drivers of Corporate investment banker Strain: Not on file  Food Insecurity: Not on file  Transportation Needs: Not on file  Physical Activity: Not on file  Stress: Not on file  Social Connections: Not on file     Family History: The patient's family history includes Cancer (age of onset: 26) in his mother; Coronary artery disease in his father; Emphysema in his father.  ROS:   Please see the history of present illness.     All other systems reviewed and are negative.  EKGs/Labs/Other Studies Reviewed:    The following studies were reviewed today:  EKG Interpretation Date/Time:  Wednesday May 14 2024 07:53:40 EDT Ventricular Rate:  66 PR Interval:  180 QRS Duration:  92 QT Interval:  408 QTC Calculation: 427 R Axis:   55  Text Interpretation: Sinus rhythm with frequent Premature ventricular complexes Septal infarct (cited on or before 14-May-2024) When compared with ECG of 09-Jan-2024 08:58, Premature ventricular complexes are now Present Confirmed by [place], Damary Doland [phone]) on 05/14/2024 8:05:50 AM   Echo 02/21/24: IMPRESSIONS     1. Left ventricular ejection fraction, by estimation, is 40 to 45%. Left  ventricular ejection fraction by 3D volume is 43 %. The left ventricle has  mildly decreased function. The left ventricle demonstrates global  hypokinesis. Left ventricular diastolic   parameters are consistent with Grade I diastolic dysfunction (impaired  relaxation). The average left ventricular global longitudinal strain is  -18.8 %. The global longitudinal strain is normal.   2. Right ventricular systolic function is normal. The right ventricular  size is normal. There is normal pulmonary artery systolic pressure.   3. The mitral valve is normal in structure. Mild mitral valve  regurgitation. No  evidence of mitral stenosis.   4. The aortic valve is normal in structure. Aortic valve regurgitation is  not visualized. No aortic stenosis is present.   5. The inferior vena cava is normal in size with greater than 50%  respiratory variability, suggesting right atrial pressure of 3 mmHg.   EKG Interpretation Date/Time:  Wednesday May 14 2024 07:53:40 EDT Ventricular Rate:  66 PR Interval:  180 QRS Duration:  92 QT Interval:  408 QTC Calculation: 427 R Axis:   55  Text Interpretation: Sinus rhythm with frequent Premature ventricular complexes Septal infarct (cited on or before 14-May-2024) When compared with ECG of 09-Jan-2024 08:58, Premature ventricular complexes  are now Present Confirmed by [place], Lasaundra Riche [id number]) on 05/14/2024 8:05:50 AM    Recent Labs: 01/09/2024: ALT 20; TSH 1.870 04/15/2024: BUN 13; Creatinine, Ser 1.00; Potassium 5.4; Sodium 139  Recent Lipid Panel    Component Value Date/Time   CHOL 159 05/25/2022 0842   TRIG 49 05/25/2022 0842   HDL 76 05/25/2022 0842   CHOLHDL 2.1 05/25/2022 0842   CHOLHDL 2.4 12/10/2010 0411   VLDL 8 12/10/2010 0411   LDLCALC 73 05/25/2022 0842     Risk Assessment/Calculations:                Physical Exam:    VS:  BP 128/70   Pulse 66   Ht 5' 10 (1.778 m)   Wt 145 lb 6.4 oz (66 kg)   SpO2 99%   BMI 20.86 kg/m     Wt Readings from Last 3 Encounters:  05/14/24 145 lb 6.4 oz (66 kg)  01/09/24 139 lb (63 kg)  11/22/23 144 lb (65.3 kg)     GEN:  Well nourished, well developed in no acute distress HEENT: Normal NECK: No JVD; No carotid bruits LYMPHATICS: No lymphadenopathy CARDIAC: RRR, no murmurs, rubs, gallops RESPIRATORY:  Clear to auscultation without rales, wheezing or rhonchi  ABDOMEN: Soft, non-tender, non-distended MUSCULOSKELETAL:  No edema; No deformity  SKIN: Warm and dry NEUROLOGIC:  Alert and oriented x 3 PSYCHIATRIC:  Normal affect   ASSESSMENT:    1. Chronic systolic heart failure (HCC)    2. ICD (implantable cardioverter-defibrillator) in place   3. VT (ventricular tachycardia) (HCC)   4. NICM (nonischemic cardiomyopathy) (HCC)   5. Medication management   6. Primary hypertension   7. Cardiomyopathy, secondary (HCC)    PLAN:    In order of problems listed above:  Chronic systolic CHF secondary to nonischemic CM. EF initially 15%. Improved to 40-45%. Class 1. On good therapy. Now on Coreg , aldactone , Jardiance , lasix  and losartan . I would like to switch losartan  to Entresto 49/51 mg bid. Make change lasix  to PRN. Low sodium diet. Will follow up in 6 months and consider increasing Entresto if tolerating.  NSVT on amiodarone . Has ICD. No symptoms. Labs ok. Followed by EP.  HTN controlled.            Medication Adjustments/Labs and Tests Ordered: Current medicines are reviewed at length with the patient today.  Concerns regarding medicines are outlined above.  Orders Placed This Encounter  Procedures   EKG 12-Lead   Meds ordered this encounter  Medications   sacubitril-valsartan (ENTRESTO) 49-51 MG    Sig: Take 1 tablet by mouth 2 (two) times daily.    Dispense:  180 tablet    Refill:  3   furosemide  (LASIX ) 20 MG tablet    Sig: Take 1 tablet (20 mg total) by mouth daily as needed for edema or fluid. TAKE 1 TABLET BY MOUTH ONCE DAILY . APPOINTMENT REQUIRED FOR FUTURE REFILLS    Dispense:  90 tablet    Refill:  3   spironolactone  (ALDACTONE ) 25 MG tablet    Sig: TAKE 1 TABLET BY MOUTH ONCE DAILY . APPOINTMENT REQUIRED FOR FUTURE REFILLS    Dispense:  90 tablet    Refill:  3   amiodarone  (PACERONE ) 200 MG tablet    Sig: Take 1 tablet (200 mg total) by mouth daily.    Dispense:  90 tablet    Refill:  3    There are no Patient Instructions on file for this visit.  Signed, Dannon Nguyenthi Swaziland, MD  05/14/2024 8:18 AM    [facility]

## 2024-05-14 ENCOUNTER — Encounter: Payer: Self-pay | Admitting: Cardiology

## 2024-05-14 ENCOUNTER — Ambulatory Visit: Attending: Cardiology | Admitting: Cardiology

## 2024-05-14 VITALS — BP 128/70 | HR 66 | Ht 70.0 in | Wt 145.4 lb

## 2024-05-14 DIAGNOSIS — I428 Other cardiomyopathies: Secondary | ICD-10-CM | POA: Diagnosis not present

## 2024-05-14 DIAGNOSIS — I472 Ventricular tachycardia, unspecified: Secondary | ICD-10-CM

## 2024-05-14 DIAGNOSIS — I5022 Chronic systolic (congestive) heart failure: Secondary | ICD-10-CM

## 2024-05-14 DIAGNOSIS — I429 Cardiomyopathy, unspecified: Secondary | ICD-10-CM

## 2024-05-14 DIAGNOSIS — I10 Essential (primary) hypertension: Secondary | ICD-10-CM

## 2024-05-14 DIAGNOSIS — Z95810 Presence of automatic (implantable) cardiac defibrillator: Secondary | ICD-10-CM | POA: Diagnosis not present

## 2024-05-14 DIAGNOSIS — Z79899 Other long term (current) drug therapy: Secondary | ICD-10-CM

## 2024-05-14 MED ORDER — SPIRONOLACTONE 25 MG PO TABS: ORAL_TABLET | ORAL | 3 refills | Status: AC

## 2024-05-14 MED ORDER — SACUBITRIL-VALSARTAN 49-51 MG PO TABS: 1.0000 | ORAL_TABLET | Freq: Two times a day (BID) | ORAL | 3 refills | Status: DC

## 2024-05-14 MED ORDER — FUROSEMIDE 20 MG PO TABS: 20.0000 mg | ORAL_TABLET | Freq: Every day | ORAL | 3 refills | Status: AC | PRN

## 2024-05-14 MED ORDER — AMIODARONE HCL 200 MG PO TABS
200.0000 mg | ORAL_TABLET | Freq: Every day | ORAL | 3 refills | Status: AC
Start: 2024-05-14 — End: ?

## 2024-05-14 NOTE — Patient Instructions (Addendum)
 Medication Instructions:  Stop Losartan  Start Entresto 49/51 mg twice a day Take Lasix  daily only if needed Continue all other medications  *If you need a refill on your cardiac medications before your next appointment, please call your pharmacy*  Lab Work: None ordered  Testing/Procedures: None ordered  Follow-Up: At [facility], you and your health needs are our priority.  As part of our continuing mission to provide you with exceptional heart care, our providers are all part of one team.  This team includes your primary Cardiologist (physician) and Advanced Practice Providers or APPs (Physician Assistants and Nurse Practitioners) who all work together to provide you with the care you need, when you need it.  Your next appointment:  6 months   Call in Jan to schedule April appointment     Provider:  Dr.Jordan   We recommend signing up for the patient portal called MyChart.  Sign up information is provided on this After Visit Summary.  MyChart is used to connect with patients for Virtual Visits (Telemedicine).  Patients are able to view lab/test results, encounter notes, upcoming appointments, etc.  Non-urgent messages can be sent to your provider as well.   To learn more about what you can do with MyChart, go to [email or web address].

## 2024-05-20 ENCOUNTER — Ambulatory Visit: Admitting: Pharmacist

## 2024-05-23 ENCOUNTER — Encounter: Payer: Self-pay | Admitting: Nurse Practitioner

## 2024-05-23 ENCOUNTER — Ambulatory Visit: Admitting: Nurse Practitioner

## 2024-05-23 VITALS — BP 126/80 | HR 57 | Temp 97.0°F | Ht 70.0 in | Wt 143.0 lb

## 2024-05-23 DIAGNOSIS — J411 Mucopurulent chronic bronchitis: Secondary | ICD-10-CM

## 2024-05-23 DIAGNOSIS — I10 Essential (primary) hypertension: Secondary | ICD-10-CM

## 2024-05-23 DIAGNOSIS — R7989 Other specified abnormal findings of blood chemistry: Secondary | ICD-10-CM

## 2024-05-23 DIAGNOSIS — I5022 Chronic systolic (congestive) heart failure: Secondary | ICD-10-CM

## 2024-05-23 DIAGNOSIS — I429 Cardiomyopathy, unspecified: Secondary | ICD-10-CM | POA: Diagnosis not present

## 2024-05-23 DIAGNOSIS — F1011 Alcohol abuse, in remission: Secondary | ICD-10-CM

## 2024-05-23 NOTE — Progress Notes (Signed)
 Subjective:    Patient ID: George Barber, male    DOB: 1960/12/27, 63 y.o.   MRN: [id number]   Chief Complaint: medical management of chronic issues     HPI:  George Barber is a 63 y.o. who identifies as a male who was assigned male at birth.   Social history: Lives with: wife Work history: retired   Water Engineer in today for follow up of the following chronic medical issues:  1. Primary hypertension No c/o chest pain, sob or headache. Does not check blood pressure at home. BP Readings from Last 3 Encounters:  05/14/24 128/70  04/15/24 (!) 143/70  03/11/24 (!) 156/88     2. Cardiomyopathy, secondary (HCC) 3. Chronic systolic heart failure (HCC) Last saw cardiology on 05/14/24. He has a pacemaker and has it checked monthly- is still on pacerone . Had echi cardiogram. EJF was down. They stopped his losartan  and started him on entresto and lasix  as needed.  4. Mucopurulent chronic bronchitis (HCC) Uses anoro daily- denies cough or SOB  5. Elevated LFTs Lab Results  Component Value Date   ALT 20 01/09/2024   AST 25 01/09/2024   ALKPHOS 88 01/09/2024   BILITOT 0.4 01/09/2024     6. History of ETOH abuse Has not drank in several years   New complaints: None today  No Known Allergies Outpatient Encounter Medications as of 05/23/2024  Medication Sig   albuterol  (VENTOLIN  HFA) 108 (90 Base) MCG/ACT inhaler INHALE 2 PUFFS BY MOUTH EVERY 6 HOURS AS NEEDED FOR WHEEZING AND FOR SHORTNESS OF BREATH .   amiodarone  (PACERONE ) 200 MG tablet Take 1 tablet (200 mg total) by mouth daily.   aspirin 81 MG tablet Take 81 mg by mouth daily.     Blood Pressure Monitoring (OMRON 3 SERIES BP MONITOR) DEVI Use to measure blood pressure as directed.   carvedilol  (COREG ) 25 MG tablet Take 1 tablet (25 mg total) by mouth 2 (two) times daily.   empagliflozin  (JARDIANCE ) 10 MG TABS tablet Take 1 tablet (10 mg total) by mouth daily before breakfast.   furosemide  (LASIX ) 20 MG tablet Take 1  tablet (20 mg total) by mouth daily as needed for edema or fluid. TAKE 1 TABLET BY MOUTH ONCE DAILY . APPOINTMENT REQUIRED FOR FUTURE REFILLS   sacubitril-valsartan (ENTRESTO) 49-51 MG Take 1 tablet by mouth 2 (two) times daily.   spironolactone  (ALDACTONE ) 25 MG tablet TAKE 1 TABLET BY MOUTH ONCE DAILY . APPOINTMENT REQUIRED FOR FUTURE REFILLS   umeclidinium-vilanterol (ANORO ELLIPTA ) 62.5-25 MCG/ACT AEPB Inhale 1 puff into the lungs daily.   No facility-administered encounter medications on file as of 05/23/2024.    Past Surgical History:  Procedure Laterality Date   2D ECHOCARDIOGRAM  02/2011   CARDIAC CATHETERIZATION     ICD GENERATOR CHANGEOUT N/A 03/23/2022   Procedure: ICD GENERATOR CHANGEOUT;  Surgeon: Waddell Danelle ORN, MD;  Location: [facility] INVASIVE CV LAB;  Service: Cardiovascular;  Laterality: N/A;    Family History  Problem Relation Age of Onset   Cancer Mother 107   Coronary artery disease Father        unknown   Emphysema Father       Controlled substance contract: n/a     Review of Systems  Constitutional:  Negative for diaphoresis.  Eyes:  Negative for pain.  Respiratory:  Negative for shortness of breath.   Cardiovascular:  Negative for chest pain, palpitations and leg swelling.  Gastrointestinal:  Negative for abdominal pain.  Endocrine: Negative for  polydipsia.  Skin:  Negative for rash.  Neurological:  Negative for dizziness, weakness and headaches.  Hematological:  Does not bruise/bleed easily.  All other systems reviewed and are negative.      Objective:   Physical Exam Vitals and nursing note reviewed.  Constitutional:      Appearance: Normal appearance. He is well-developed.  HENT:     Head: Normocephalic.     Nose: Nose normal.     Mouth/Throat:     Mouth: Mucous membranes are moist.     Pharynx: Oropharynx is clear.  Eyes:     Pupils: Pupils are equal, round, and reactive to light.  Neck:     Thyroid: No thyroid mass or thyromegaly.      Vascular: No carotid bruit or JVD.     Trachea: Phonation normal.  Cardiovascular:     Rate and Rhythm: Normal rate and regular rhythm.  Pulmonary:     Effort: Pulmonary effort is normal. No respiratory distress.     Breath sounds: Normal breath sounds.  Abdominal:     General: Bowel sounds are normal.     Palpations: Abdomen is soft.     Tenderness: There is no abdominal tenderness.  Musculoskeletal:        General: Normal range of motion.     Cervical back: Normal range of motion and neck supple.  Lymphadenopathy:     Cervical: No cervical adenopathy.  Skin:    General: Skin is warm and dry.  Neurological:     Mental Status: He is alert and oriented to person, place, and time.  Psychiatric:        Behavior: Behavior normal.        Thought Content: Thought content normal.        Judgment: Judgment normal.    BP 126/80   Pulse (!) 57   Temp (!) 97 F (36.1 C) (Temporal)   Ht 5' 10 (1.778 m)   Wt 143 lb (64.9 kg)   SpO2 96%   BMI 20.52 kg/m          Assessment & Plan:   George Barber comes in today with chief complaint of medical management of chronic issues    Diagnosis and orders addressed:  1. Primary hypertension Low sodium diet - losartan  (COZAAR ) 100 MG tablet; Take 0.5 tablets (50 mg total) by mouth daily.  Dispense: 45 tablet; Refill: 1 - CBC with Differential/Platelet - CMP14+EGFR - Lipid panel  2. Cardiomyopathy, secondary (HCC) Keep yearly follow up with cardiology - amiodarone  (PACERONE ) 200 MG tablet; Take 1 tablet (200 mg total) by mouth daily.  Dispense: 90 tablet; Refill: 1 - carvedilol  (COREG ) 12.5 MG tablet; Take 1 tablet (12.5 mg total) by mouth 2 (two) times daily with a meal. TAKE 1 TABLET BY MOUTH TWICE DAILY WITH MEALS . APPOINTMENT REQUIRED FOR FUTURE REFILLS  Dispense: 180 tablet; Refill: 1  3. Chronic systolic heart failure (HCC) - furosemide  (LASIX ) 20 MG tablet; TAKE 1 TABLET BY MOUTH ONCE DAILY . APPOINTMENT REQUIRED FOR  FUTURE REFILLS  Dispense: 90 tablet; Refill: 1 - spironolactone  (ALDACTONE ) 25 MG tablet; TAKE 1 TABLET BY MOUTH ONCE DAILY . APPOINTMENT REQUIRED FOR FUTURE REFILLS  Dispense: 90 tablet; Refill: 1  4. Mucopurulent chronic bronchitis (HCC) Continue inhalers  5. Elevated LFTs Labs pending  6. History of ETOH abuse Avoid alcohol   Labs pending Health Maintenance reviewed Diet and exercise encouraged  Follow up plan: 6 months   Mary-Margaret Gladis, FNP

## 2024-05-23 NOTE — Patient Instructions (Signed)

## 2024-06-09 ENCOUNTER — Ambulatory Visit: Attending: Internal Medicine

## 2024-06-09 ENCOUNTER — Telehealth: Payer: Self-pay

## 2024-06-09 DIAGNOSIS — Z95810 Presence of automatic (implantable) cardiac defibrillator: Secondary | ICD-10-CM | POA: Diagnosis not present

## 2024-06-09 DIAGNOSIS — I5022 Chronic systolic (congestive) heart failure: Secondary | ICD-10-CM | POA: Diagnosis not present

## 2024-06-09 NOTE — Progress Notes (Unsigned)
 EPIC Encounter for ICM Monitoring  Patient Name: George Barber is a 63 y.o. male Date: 06/09/2024 Primary Care Physican: Gladis Mustard, FNP Primary Cardiologist: Waddell Electrophysiologist: Waddell 09/26/2023 Weight: 142-144 lbs  11/22/2023 Office Weight: 144 lbs 12/05/2023 Weight: 143 lbs 01/09/2024 Office Weight: 139 lbs    Attempted call to patient and unable to reach.  Left message for return call.  Transmission results reviewed.          Diet:  Does not adhere to low salt diet         Since 05/05/2024 ICM remote transmission: CorVue thoracic impedance suggesting possible fluid accumulation starting 06/04/2024.   Prescribed:  Furosemide  20 mg 1 tablet daily Spironolactone  25 mg take 1 tablet daily   Labs: 04/15/2024 Creatinine 1.00, BUN 13, Potassium 5.4, Sodium 139, GFR 85 01/09/2024 Creatinine 1.08, BUN 16, Potassium 5.1, Sodium 138, GFR 78 A complete set of results can be found in Results Review.   Recommendations:  Unable to reach.     Follow-up plan: ICM clinic phone appointment on 06/17/2024 to recheck fluid levels.  91 day device clinic remote transmission 07/02/2024.   EP/Cardiology Office Visits:  Recall 11/10/2024 with Dr Jordan.   Recall 05/08/2024 with Charlies Arthur, PA.     Copy of ICM check sent to Dr. Waddell.     Remote monitoring is medically necessary for Heart Failure Management.    Daily Thoracic Impedance ICM trend: 03/11/2024 through 06/09/2024.    12-14 Month Thoracic Impedance ICM trend:     Mitzie GORMAN Garner, RN 06/09/2024 7:09 AM

## 2024-06-09 NOTE — Telephone Encounter (Signed)
Remote ICM transmission received.  Attempted call to patient regarding ICM remote transmission and left message to return call   

## 2024-06-10 NOTE — Progress Notes (Signed)
 Spoke with patient and heart failure questions reviewed.  Transmission results reviewed.  Pt asymptomatic for fluid accumulation.  His weight is stable at 140 lbs.  His Lasix  was changed to PRN after starting on Jardiance  and Entresto.  He had trouble with getting the Jardiance  filled and was without the medication for about a week but has started back after his PCP gave him some samples. Advised he has a prescription that was sent to [facility] and provided phone number. He spoke with [facility] Pharmacist in September and she arranged to help him with getting the medications.  Provided the pharmacy number where she can be reached if needed.  Advised to call Dr Adrian office first if he has problems getting the prescription that is currently on file.  Advised to take Lasix  1 tablet x 2 days and then return to PRN.    Will recheck fluid levels 06/17/2024.

## 2024-06-17 ENCOUNTER — Telehealth: Payer: Self-pay

## 2024-06-17 ENCOUNTER — Ambulatory Visit: Attending: Internal Medicine

## 2024-06-17 DIAGNOSIS — I5022 Chronic systolic (congestive) heart failure: Secondary | ICD-10-CM

## 2024-06-17 DIAGNOSIS — Z95810 Presence of automatic (implantable) cardiac defibrillator: Secondary | ICD-10-CM

## 2024-06-17 NOTE — Progress Notes (Signed)
 EPIC Encounter for ICM Monitoring  Patient Name: George Barber is a 63 y.o. male Date: 06/17/2024 Primary Care Physican: Gladis Mustard, FNP Primary Cardiologist: Waddell Electrophysiologist: Waddell 09/26/2023 Weight: 142-144 lbs  11/22/2023 Office Weight: 144 lbs 12/05/2023 Weight: 143 lbs 01/09/2024 Office Weight: 139 lbs 06/09/2024 Weight: 140 lbs    Attempted call to patient and unable to reach.  Left message for return call.  Transmission results reviewed.          Diet:  Does not adhere to low salt diet         Since 06/09/2024 ICM remote transmission: CorVue thoracic impedance suggesting fluid levels returned to normal but followed by possible dryness starting 06/10/2024.   Prescribed:  Furosemide  20 mg 1 tablet daily Spironolactone  25 mg take 1 tablet daily   Labs: 04/15/2024 Creatinine 1.00, BUN 13, Potassium 5.4, Sodium 139, GFR 85 01/09/2024 Creatinine 1.08, BUN 16, Potassium 5.1, Sodium 138, GFR 78 A complete set of results can be found in Results Review.   Recommendations:  Unable to reach.     Follow-up plan: ICM clinic phone appointment on 07/21/2024.  91 day device clinic remote transmission 07/02/2024.   EP/Cardiology Office Visits:  Recall 11/10/2024 with Dr Jordan.   Recall 05/08/2024 with Charlies Arthur, PA.     Copy of ICM check sent to Dr. Waddell.     Remote monitoring is medically necessary for Heart Failure Management.    Daily Thoracic Impedance ICM trend: 03/19/2024 through 06/17/2024.    12-14 Month Thoracic Impedance ICM trend:     Mitzie GORMAN Garner, RN 06/17/2024 8:12 AM

## 2024-06-17 NOTE — Telephone Encounter (Signed)
Remote ICM transmission received.  Attempted call to patient regarding ICM remote transmission and left message for return call

## 2024-07-02 ENCOUNTER — Ambulatory Visit: Payer: Commercial Managed Care - PPO

## 2024-07-03 ENCOUNTER — Ambulatory Visit: Payer: Self-pay | Admitting: Internal Medicine

## 2024-07-03 LAB — CUP PACEART REMOTE DEVICE CHECK
Battery Remaining Longevity: 74 mo
Battery Remaining Percentage: 80 %
Battery Voltage: 3.04 V
Brady Statistic RV Percent Paced: 1 %
Date Time Interrogation Session: 20251211110500
HighPow Impedance: 77 Ohm
HighPow Impedance: 77 Ohm
Implantable Lead Connection Status: 753985
Implantable Lead Implant Date: 20120813
Implantable Lead Location: 753860
Implantable Lead Model: 180
Implantable Lead Serial Number: 303783
Implantable Pulse Generator Implant Date: 20230831
Lead Channel Impedance Value: 290 Ohm
Lead Channel Pacing Threshold Amplitude: 1.5 V
Lead Channel Pacing Threshold Pulse Width: 0.8 ms
Lead Channel Sensing Intrinsic Amplitude: 7.5 mV
Lead Channel Setting Pacing Amplitude: 3 V
Lead Channel Setting Pacing Pulse Width: 0.8 ms
Lead Channel Setting Sensing Sensitivity: 0.5 mV
Pulse Gen Serial Number: 8943728

## 2024-07-04 ENCOUNTER — Ambulatory Visit: Attending: Cardiology | Admitting: Pharmacist

## 2024-07-04 VITALS — BP 120/72 | HR 65

## 2024-07-04 DIAGNOSIS — I5022 Chronic systolic (congestive) heart failure: Secondary | ICD-10-CM | POA: Diagnosis not present

## 2024-07-04 MED ORDER — EMPAGLIFLOZIN 10 MG PO TABS: 10.0000 mg | ORAL_TABLET | Freq: Every day | ORAL | 3 refills | Status: AC

## 2024-07-04 NOTE — Progress Notes (Signed)
 Patient ID: George Barber                 DOB: 01-19-1961                      MRN: [id number]     HPI: FINDLAY DAGHER is a 63 y.o. male referred by Dr. Jordan to pharmacy clinic for HF medication management. PMH is significant for nonischemic cardiomyopathy with improved EF, initially 15% improved to 40 to 45%.  Nonsustained ventricular tachycardia status post ICD, hypertension.. Most recent LVEF 40-45% on 02/21/24.  Was seen by Dr. Jordan 05/14/2024 after prolonged period of time without seeing general cardiology.  He was seen by Robbi Blanch [phone].  Carvedilol  was increased to 25 mg twice a day and Jardiance  10 mg daily was started.  He followed up with Robbi on 04/15/2024.  Blood pressure was elevated in clinic but no changes were made other than Lasix  changed to as needed.  His potassium was elevated at 5.4.  He was advised to decrease potassium rich foods and was supposed to recheck in 1 week, but I do not see that labs have been done.  Saw Dr. Jordan 10/22 where his losartan  was changed to Entresto  49/51 mg twice a day.  Patient presents today for follow-up.  He has not been able to get Jardiance  yet.  His insurance will require that he call for the pharmacies that could fill it.  He reports that it needs to be sent to [facility] [phone] fax, [phone] phone.  I cannot find this in epic therefore a written prescription was faxed.  He got samples from his primary care in October.  States he has been cutting them in half and taking them.  Patient thinks he is only been taking Entresto  once a day.  Educated that this is post me twice a day along with his carvedilol .  He does not consistently check his blood pressure at home but states when he does it is generally 110-120 systolic.  Has been taking furosemide  daily. Denies any shortness of breath, swelling, fatigue.   Current CHF meds:  Carvedilol  25 mg twice daily and Entresto  49/51 mg twice a day Lasix  20 mg daily PRN ,  spironolactone  25 mg daily, Jardiance  10 mg daily  Previously tried:  Adherence Assessment  Do you ever forget to take your medication? [] Yes [x] No  Do you ever skip doses due to side effects? [] Yes [x] No  Do you have trouble affording your medicines? [] Yes [x] No  Are you ever unable to pick up your medication due to transportation difficulties? [] Yes [x] No  Do you ever stop taking your medications because you don't believe they are helping? [] Yes [x] No  Do you check your weight daily? [] Yes [x] No   BP goal: Less than 130/80  Family History:  Family History  Problem Relation Age of Onset   Cancer Mother 17   Coronary artery disease Father        unknown   Emphysema Father      Social History:  1 beer per day  Smoking: quit since 2012   Diet: Not discussed today  Exercise: stays active around the house but does not have structured exercise schedule   Home BP readings: Patient reports 110-120 systolic  Wt Readings from Last 3 Encounters:  05/23/24 143 lb (64.9 kg)  05/14/24 145 lb 6.4 oz (66 kg)  01/09/24 139 lb (63 kg)   BP Readings from Last 3 Encounters:  07/04/24  120/72  05/23/24 126/80  05/14/24 128/70   Pulse Readings from Last 3 Encounters:  07/04/24 65  05/23/24 (!) 57  05/14/24 66    Renal function: CrCl cannot be calculated (Patient's most recent lab result is older than the maximum 21 days allowed.).  Past Medical History:  Diagnosis Date   Elevated LFTs    Emphysema    Fluttering heart    NICM (nonischemic cardiomyopathy) (HCC)    cath 12/13/10: Normal cors, EF 10-15%;  b. echo 5/12 EF 15%, mild MR, mod LAE, mild RVE, mild to mod RAE, mild to mod TR, PASP 44   NSVT (nonsustained ventricular tachycardia) (HCC)    Life Vest; amiodarone  rx   Systolic CHF, chronic (HCC)     Medications Ordered Prior to Encounter[1]  Allergies[2]   Assessment/Plan:  1. CHF -  Chronic systolic heart failure (HCC) Assessment: Blood pressure  well-controlled in clinic today Limited home readings available patient reports blood pressure at home is always good sometimes even low in the 110's Has only been taking Entresto  once a day BMP has not been checked since his last potassium was 5.4 Currently taking half of the 10 mg Jardiance  (got samples from his primary care) We will try faxing a written prescription to [facility]-this is where his insurance told him he needed to be filled He has been taking furosemide  daily even though he was told he can take it as needed Denies any swelling shortness of breath or fatigue  Plan: Check BMP today Start taking Entresto  twice a day ContinueCarvedilol 25 mg twice daily and Entresto  49/51 mg twice a day , spironolactone  25 mg daily, Jardiance  10 mg daily  Take Lasix  only as needed for swelling  Thank you   Eleanor JONETTA Crews, Pharm.JONETTA SARAN, CPP [facility] of [facility] [address], [place], [place] [address]  Phone: [phone]; Fax: [phone]        [1]  Current Outpatient Medications on File Prior to Visit  Medication Sig Dispense Refill   amiodarone  (PACERONE ) 200 MG tablet Take 1 tablet (200 mg total) by mouth daily. 90 tablet 3   aspirin 81 MG tablet Take 81 mg by mouth daily.       carvedilol  (COREG ) 25 MG tablet Take 1 tablet (25 mg total) by mouth 2 (two) times daily.     albuterol  (VENTOLIN  HFA) 108 (90 Base) MCG/ACT inhaler INHALE 2 PUFFS BY MOUTH EVERY 6 HOURS AS NEEDED FOR WHEEZING AND FOR SHORTNESS OF BREATH . 18 g 3   Blood Pressure Monitoring (OMRON 3 SERIES BP MONITOR) DEVI Use to measure blood pressure as directed. 1 each 0   furosemide  (LASIX ) 20 MG tablet Take 1 tablet (20 mg total) by mouth daily as needed for edema or fluid. TAKE 1 TABLET BY MOUTH ONCE DAILY . APPOINTMENT REQUIRED FOR FUTURE REFILLS 90 tablet 3   sacubitril -valsartan  (ENTRESTO ) 49-51 MG Take 1 tablet by mouth 2 (two) times daily. 180 tablet 3    spironolactone  (ALDACTONE ) 25 MG tablet TAKE 1 TABLET BY MOUTH ONCE DAILY . APPOINTMENT REQUIRED FOR FUTURE REFILLS 90 tablet 3   umeclidinium-vilanterol (ANORO ELLIPTA ) 62.5-25 MCG/ACT AEPB Inhale 1 puff into the lungs daily. 90 each 3   No current facility-administered medications on file prior to visit.  [2] No Known Allergies

## 2024-07-04 NOTE — Patient Instructions (Addendum)
 Please start taking Entresto  TWICE a day Continue carvedilil 25mg  twice a day, Entresto  49/51mg  twice a day, spironolactone  25mg  daily and Jadiance 10mg  daily You can use furosemide  as needed for swelling (do not have to take every day)  Please call me if there are any issues with your Jardiance . I will fax to the [facility]

## 2024-07-04 NOTE — Assessment & Plan Note (Addendum)
 Assessment: Blood pressure well-controlled in clinic today Limited home readings available patient reports blood pressure at home is always good sometimes even low in the 110's Has only been taking Entresto  once a day BMP has not been checked since his last potassium was 5.4 Currently taking half of the 10 mg Jardiance  (got samples from his primary care) We will try faxing a written prescription to [facility]-this is where his insurance told him he needed to be filled He has been taking furosemide  daily even though he was told he can take it as needed Denies any swelling shortness of breath or fatigue  Plan: Check BMP today Start taking Entresto  twice a day ContinueCarvedilol 25 mg twice daily and Entresto  49/51 mg twice a day , spironolactone  25 mg daily, Jardiance  10 mg daily  Take Lasix  only as needed for swelling

## 2024-07-05 LAB — BASIC METABOLIC PANEL WITH GFR
BUN/Creatinine Ratio: 12 (ref 10–24)
BUN: 16 mg/dL (ref 8–27)
CO2: 26 mmol/L (ref 20–29)
Calcium: 9.8 mg/dL (ref 8.6–10.2)
Chloride: 99 mmol/L (ref 96–106)
Creatinine, Ser: 1.36 mg/dL — ABNORMAL HIGH (ref 0.76–1.27)
Glucose: 84 mg/dL (ref 70–99)
Potassium: 4.6 mmol/L (ref 3.5–5.2)
Sodium: 140 mmol/L (ref 134–144)
eGFR: 59 mL/min/1.73 — ABNORMAL LOW (ref >59)

## 2024-07-07 ENCOUNTER — Ambulatory Visit: Payer: Self-pay | Admitting: Pharmacist

## 2024-07-07 DIAGNOSIS — I5022 Chronic systolic (congestive) heart failure: Secondary | ICD-10-CM

## 2024-07-08 NOTE — Addendum Note (Signed)
 Addended by: Rayma Hegg D on: 07/08/2024 12:45 PM   Modules accepted: Orders

## 2024-07-08 NOTE — Telephone Encounter (Signed)
 Reviewed with patient. He will make sure he stopped furosemide  and take Entresto  twice a day 12 hr apart. He will check his BP at home and go for labs in 2 weeks We will talk about labs and BP at that time.

## 2024-07-09 NOTE — Progress Notes (Signed)
 Remote ICD Transmission

## 2024-07-20 ENCOUNTER — Other Ambulatory Visit: Payer: Self-pay | Admitting: Nurse Practitioner

## 2024-07-20 DIAGNOSIS — J411 Mucopurulent chronic bronchitis: Secondary | ICD-10-CM

## 2024-07-21 ENCOUNTER — Ambulatory Visit: Attending: Internal Medicine

## 2024-07-21 DIAGNOSIS — Z95810 Presence of automatic (implantable) cardiac defibrillator: Secondary | ICD-10-CM | POA: Diagnosis not present

## 2024-07-21 DIAGNOSIS — I5022 Chronic systolic (congestive) heart failure: Secondary | ICD-10-CM | POA: Diagnosis not present

## 2024-07-22 ENCOUNTER — Telehealth: Payer: Self-pay

## 2024-07-22 NOTE — Telephone Encounter (Signed)
 Remote ICM transmission received.  Attempted call to patient regarding ICM remote transmission.  Left detailed message per DPR with ICM phone number to return call for any questions, concerns or fluid symptoms.

## 2024-07-22 NOTE — Progress Notes (Signed)
 EPIC Encounter for ICM Monitoring  Patient Name: George Barber is a 63 y.o. male Date: 07/22/2024 Primary Care Physican: Gladis Mustard, FNP Primary Cardiologist: Mealor Electrophysiologist: Mealor 09/26/2023 Weight: 142-144 lbs  11/22/2023 Office Weight: 144 lbs 12/05/2023 Weight: 143 lbs 01/09/2024 Office Weight: 139 lbs 06/09/2024 Weight: 140 lbs    Attempted call to patient and unable to reach.   Transmission results reviewed.          Diet:  Does not adhere to low salt diet         Since 06/17/2024 ICM remote transmission: CorVue thoracic impedance normal fluid levels with exception of possible fluid accumulation 07/10/2024-07/18/2024.   Prescribed:  Furosemide  20 mg 1 tablet by mouth daily as needed for edema or fluid Spironolactone  25 mg take 1 tablet daily   Labs: 04/15/2024 Creatinine 1.00, BUN 13, Potassium 5.4, Sodium 139, GFR 85 01/09/2024 Creatinine 1.08, BUN 16, Potassium 5.1, Sodium 138, GFR 78 A complete set of results can be found in Results Review.   Recommendations:  Left voice mail with ICM number and encouraged to call if experiencing any fluid symptoms.   Follow-up plan: ICM clinic phone appointment on 08/25/2024.  91 day device clinic remote transmission 10/01/2024.   EP/Cardiology Office Visits:  Recall 11/10/2024 with Dr Jordan.   Recall 05/08/2024 with Charlies Arthur, PA.     Copy of ICM check sent to Dr. Nancey.     Remote monitoring is medically necessary for Heart Failure Management.    Daily Thoracic Impedance ICM trend: 04/22/2024 through 07/21/2024.    12-14 Month Thoracic Impedance ICM trend:     Mitzie GORMAN Garner, RN 07/22/2024 11:10 AM

## 2024-07-30 ENCOUNTER — Other Ambulatory Visit

## 2024-07-30 LAB — BASIC METABOLIC PANEL WITH GFR
BUN/Creatinine Ratio: 10 (ref 10–24)
BUN: 11 mg/dL (ref 8–27)
CO2: 24 mmol/L (ref 20–29)
Calcium: 9.8 mg/dL (ref 8.6–10.2)
Chloride: 103 mmol/L (ref 96–106)
Creatinine, Ser: 1.05 mg/dL (ref 0.76–1.27)
Glucose: 93 mg/dL (ref 70–99)
Potassium: 4.7 mmol/L (ref 3.5–5.2)
Sodium: 142 mmol/L (ref 134–144)
eGFR: 80 mL/min/1.73

## 2024-08-01 MED ORDER — SACUBITRIL-VALSARTAN 97-103 MG PO TABS: 1.0000 | ORAL_TABLET | Freq: Two times a day (BID) | ORAL | 11 refills | Status: AC

## 2024-08-01 NOTE — Addendum Note (Signed)
 Addended by: Pearly Bartosik D on: 08/01/2024 09:31 AM   Modules accepted: Orders

## 2024-08-01 NOTE — Telephone Encounter (Signed)
 Reviewed labs with patient. Scr much better. He is not using furosemide  everyday. BP at home 120/70's. Will increase Entresto  to 97/103mg  BID and repeat labs in 2 weeks.

## 2024-08-21 NOTE — Progress Notes (Signed)
 31 day ICM Remote transmission canceled due to [facility] clinic is on hold until further notice.  91 day remote monitoring will continue per protocol.

## 2024-08-25 ENCOUNTER — Ambulatory Visit

## 2024-08-26 ENCOUNTER — Other Ambulatory Visit: Payer: Self-pay | Admitting: Nurse Practitioner

## 2024-08-26 DIAGNOSIS — J411 Mucopurulent chronic bronchitis: Secondary | ICD-10-CM

## 2024-08-27 MED ORDER — UMECLIDINIUM-VILANTEROL 62.5-25 MCG/ACT IN AEPB: 1.0000 | INHALATION_SPRAY | Freq: Every day | RESPIRATORY_TRACT | 3 refills | Status: AC

## 2024-08-27 NOTE — Telephone Encounter (Signed)
 Talked w/ pt about pharmacy and looked through scanned media. Pharmacy is [facility], they can not receive electronic scripts. I called them at [phone] they will fax us  a refill request for us  to fax back a printed script for pt's Anora. If appropriate please print and sign refill then have brought to me so that I can fax script.

## 2024-11-20 ENCOUNTER — Ambulatory Visit: Admitting: Nurse Practitioner
